# Patient Record
Sex: Female | Born: 1935
Health system: Southern US, Community
[De-identification: ages and names within clinical notes are randomized; demographics above are authoritative.]

## PROBLEM LIST (undated history)

## (undated) DIAGNOSIS — E785 Hyperlipidemia, unspecified: Secondary | ICD-10-CM

## (undated) DIAGNOSIS — H409 Unspecified glaucoma: Secondary | ICD-10-CM

## (undated) DIAGNOSIS — R51 Headache: Secondary | ICD-10-CM

## (undated) DIAGNOSIS — H548 Legal blindness, as defined in USA: Secondary | ICD-10-CM

## (undated) DIAGNOSIS — I10 Essential (primary) hypertension: Secondary | ICD-10-CM

## (undated) DIAGNOSIS — I609 Nontraumatic subarachnoid hemorrhage, unspecified: Secondary | ICD-10-CM

## (undated) DIAGNOSIS — R519 Headache, unspecified: Secondary | ICD-10-CM

## (undated) HISTORY — DX: Headache, unspecified: R51.9

## (undated) HISTORY — DX: Essential (primary) hypertension: I10

## (undated) HISTORY — PX: CORNEAL TRANSPLANT: SHX108

## (undated) HISTORY — DX: Hyperlipidemia, unspecified: E78.5

## (undated) HISTORY — DX: Nontraumatic subarachnoid hemorrhage, unspecified: I60.9

## (undated) HISTORY — DX: Headache: R51

## (undated) HISTORY — DX: Unspecified glaucoma: H40.9

---

## 1973-04-12 HISTORY — PX: ABDOMINAL HYSTERECTOMY: SHX81

## 2014-04-17 DIAGNOSIS — I10 Essential (primary) hypertension: Secondary | ICD-10-CM | POA: Diagnosis not present

## 2014-04-17 DIAGNOSIS — R109 Unspecified abdominal pain: Secondary | ICD-10-CM | POA: Diagnosis not present

## 2014-04-17 DIAGNOSIS — R0600 Dyspnea, unspecified: Secondary | ICD-10-CM | POA: Diagnosis not present

## 2014-04-17 DIAGNOSIS — K219 Gastro-esophageal reflux disease without esophagitis: Secondary | ICD-10-CM | POA: Diagnosis not present

## 2014-04-18 DIAGNOSIS — R079 Chest pain, unspecified: Secondary | ICD-10-CM | POA: Diagnosis not present

## 2014-04-18 DIAGNOSIS — I10 Essential (primary) hypertension: Secondary | ICD-10-CM | POA: Diagnosis not present

## 2014-04-18 DIAGNOSIS — E782 Mixed hyperlipidemia: Secondary | ICD-10-CM | POA: Diagnosis not present

## 2014-04-23 DIAGNOSIS — I10 Essential (primary) hypertension: Secondary | ICD-10-CM | POA: Diagnosis not present

## 2014-04-23 DIAGNOSIS — R109 Unspecified abdominal pain: Secondary | ICD-10-CM | POA: Diagnosis not present

## 2014-04-23 DIAGNOSIS — K219 Gastro-esophageal reflux disease without esophagitis: Secondary | ICD-10-CM | POA: Diagnosis not present

## 2014-04-26 DIAGNOSIS — H402233 Chronic angle-closure glaucoma, bilateral, severe stage: Secondary | ICD-10-CM | POA: Diagnosis not present

## 2014-04-30 DIAGNOSIS — R109 Unspecified abdominal pain: Secondary | ICD-10-CM | POA: Diagnosis not present

## 2014-04-30 DIAGNOSIS — R079 Chest pain, unspecified: Secondary | ICD-10-CM | POA: Diagnosis not present

## 2014-05-09 DIAGNOSIS — J069 Acute upper respiratory infection, unspecified: Secondary | ICD-10-CM | POA: Diagnosis not present

## 2014-05-09 DIAGNOSIS — I10 Essential (primary) hypertension: Secondary | ICD-10-CM | POA: Diagnosis not present

## 2014-05-09 DIAGNOSIS — R0600 Dyspnea, unspecified: Secondary | ICD-10-CM | POA: Diagnosis not present

## 2014-05-09 DIAGNOSIS — K219 Gastro-esophageal reflux disease without esophagitis: Secondary | ICD-10-CM | POA: Diagnosis not present

## 2014-05-09 DIAGNOSIS — M5030 Other cervical disc degeneration, unspecified cervical region: Secondary | ICD-10-CM | POA: Diagnosis not present

## 2014-05-20 DIAGNOSIS — R110 Nausea: Secondary | ICD-10-CM | POA: Diagnosis not present

## 2014-05-20 DIAGNOSIS — R072 Precordial pain: Secondary | ICD-10-CM | POA: Diagnosis not present

## 2014-05-20 DIAGNOSIS — M79602 Pain in left arm: Secondary | ICD-10-CM | POA: Diagnosis not present

## 2014-05-20 DIAGNOSIS — M542 Cervicalgia: Secondary | ICD-10-CM | POA: Diagnosis not present

## 2014-06-06 DIAGNOSIS — M47812 Spondylosis without myelopathy or radiculopathy, cervical region: Secondary | ICD-10-CM | POA: Diagnosis not present

## 2014-06-06 DIAGNOSIS — G894 Chronic pain syndrome: Secondary | ICD-10-CM | POA: Diagnosis not present

## 2014-06-06 DIAGNOSIS — M47892 Other spondylosis, cervical region: Secondary | ICD-10-CM | POA: Diagnosis not present

## 2014-06-06 DIAGNOSIS — Z87891 Personal history of nicotine dependence: Secondary | ICD-10-CM | POA: Diagnosis not present

## 2014-06-21 DIAGNOSIS — M5412 Radiculopathy, cervical region: Secondary | ICD-10-CM | POA: Diagnosis not present

## 2014-06-21 DIAGNOSIS — M47812 Spondylosis without myelopathy or radiculopathy, cervical region: Secondary | ICD-10-CM | POA: Diagnosis not present

## 2014-06-21 DIAGNOSIS — M4802 Spinal stenosis, cervical region: Secondary | ICD-10-CM | POA: Diagnosis not present

## 2014-06-21 DIAGNOSIS — M791 Myalgia: Secondary | ICD-10-CM | POA: Diagnosis not present

## 2014-06-21 DIAGNOSIS — G894 Chronic pain syndrome: Secondary | ICD-10-CM | POA: Diagnosis not present

## 2014-06-21 DIAGNOSIS — M4800 Spinal stenosis, site unspecified: Secondary | ICD-10-CM | POA: Diagnosis not present

## 2014-07-08 DIAGNOSIS — H402230 Chronic angle-closure glaucoma, bilateral, stage unspecified: Secondary | ICD-10-CM | POA: Diagnosis not present

## 2014-07-08 DIAGNOSIS — Z961 Presence of intraocular lens: Secondary | ICD-10-CM | POA: Diagnosis not present

## 2014-07-08 DIAGNOSIS — H18233 Secondary corneal edema, bilateral: Secondary | ICD-10-CM | POA: Diagnosis not present

## 2014-07-08 DIAGNOSIS — Z947 Corneal transplant status: Secondary | ICD-10-CM | POA: Diagnosis not present

## 2014-07-08 DIAGNOSIS — H02403 Unspecified ptosis of bilateral eyelids: Secondary | ICD-10-CM | POA: Diagnosis not present

## 2014-07-08 DIAGNOSIS — H35352 Cystoid macular degeneration, left eye: Secondary | ICD-10-CM | POA: Diagnosis not present

## 2014-07-08 DIAGNOSIS — T85398D Other mechanical complication of other ocular prosthetic devices, implants and grafts, subsequent encounter: Secondary | ICD-10-CM | POA: Diagnosis not present

## 2014-07-09 DIAGNOSIS — I10 Essential (primary) hypertension: Secondary | ICD-10-CM | POA: Diagnosis not present

## 2014-07-09 DIAGNOSIS — K219 Gastro-esophageal reflux disease without esophagitis: Secondary | ICD-10-CM | POA: Diagnosis not present

## 2014-07-09 DIAGNOSIS — E785 Hyperlipidemia, unspecified: Secondary | ICD-10-CM | POA: Diagnosis not present

## 2014-07-09 DIAGNOSIS — M47812 Spondylosis without myelopathy or radiculopathy, cervical region: Secondary | ICD-10-CM | POA: Diagnosis not present

## 2014-07-17 DIAGNOSIS — H35352 Cystoid macular degeneration, left eye: Secondary | ICD-10-CM | POA: Diagnosis not present

## 2014-07-18 DIAGNOSIS — E785 Hyperlipidemia, unspecified: Secondary | ICD-10-CM | POA: Diagnosis not present

## 2014-07-18 DIAGNOSIS — M47812 Spondylosis without myelopathy or radiculopathy, cervical region: Secondary | ICD-10-CM | POA: Diagnosis not present

## 2014-07-25 DIAGNOSIS — M47812 Spondylosis without myelopathy or radiculopathy, cervical region: Secondary | ICD-10-CM | POA: Diagnosis not present

## 2014-07-26 DIAGNOSIS — H4011X3 Primary open-angle glaucoma, severe stage: Secondary | ICD-10-CM | POA: Diagnosis not present

## 2014-08-07 DIAGNOSIS — R42 Dizziness and giddiness: Secondary | ICD-10-CM | POA: Diagnosis not present

## 2014-08-07 DIAGNOSIS — G319 Degenerative disease of nervous system, unspecified: Secondary | ICD-10-CM | POA: Diagnosis not present

## 2014-08-07 DIAGNOSIS — H538 Other visual disturbances: Secondary | ICD-10-CM | POA: Diagnosis not present

## 2014-08-07 DIAGNOSIS — Z87891 Personal history of nicotine dependence: Secondary | ICD-10-CM | POA: Diagnosis not present

## 2014-08-07 DIAGNOSIS — R51 Headache: Secondary | ICD-10-CM | POA: Diagnosis not present

## 2014-08-07 DIAGNOSIS — I10 Essential (primary) hypertension: Secondary | ICD-10-CM | POA: Diagnosis not present

## 2014-08-08 DIAGNOSIS — R51 Headache: Secondary | ICD-10-CM | POA: Diagnosis not present

## 2014-08-08 DIAGNOSIS — M5030 Other cervical disc degeneration, unspecified cervical region: Secondary | ICD-10-CM | POA: Diagnosis not present

## 2014-08-19 DIAGNOSIS — M791 Myalgia: Secondary | ICD-10-CM | POA: Diagnosis not present

## 2014-08-19 DIAGNOSIS — M4802 Spinal stenosis, cervical region: Secondary | ICD-10-CM | POA: Diagnosis not present

## 2014-08-19 DIAGNOSIS — G894 Chronic pain syndrome: Secondary | ICD-10-CM | POA: Diagnosis not present

## 2014-08-19 DIAGNOSIS — M5412 Radiculopathy, cervical region: Secondary | ICD-10-CM | POA: Diagnosis not present

## 2014-08-30 DIAGNOSIS — H402233 Chronic angle-closure glaucoma, bilateral, severe stage: Secondary | ICD-10-CM | POA: Diagnosis not present

## 2014-09-02 DIAGNOSIS — M4802 Spinal stenosis, cervical region: Secondary | ICD-10-CM | POA: Diagnosis not present

## 2014-09-02 DIAGNOSIS — G894 Chronic pain syndrome: Secondary | ICD-10-CM | POA: Diagnosis not present

## 2014-09-02 DIAGNOSIS — M47812 Spondylosis without myelopathy or radiculopathy, cervical region: Secondary | ICD-10-CM | POA: Diagnosis not present

## 2014-09-02 DIAGNOSIS — M5412 Radiculopathy, cervical region: Secondary | ICD-10-CM | POA: Diagnosis not present

## 2014-09-17 DIAGNOSIS — Z6827 Body mass index (BMI) 27.0-27.9, adult: Secondary | ICD-10-CM | POA: Diagnosis not present

## 2014-09-17 DIAGNOSIS — Z87891 Personal history of nicotine dependence: Secondary | ICD-10-CM | POA: Diagnosis not present

## 2014-09-17 DIAGNOSIS — M5412 Radiculopathy, cervical region: Secondary | ICD-10-CM | POA: Diagnosis not present

## 2014-09-17 DIAGNOSIS — I10 Essential (primary) hypertension: Secondary | ICD-10-CM | POA: Diagnosis not present

## 2014-10-10 DIAGNOSIS — R51 Headache: Secondary | ICD-10-CM | POA: Diagnosis not present

## 2014-10-10 DIAGNOSIS — M4802 Spinal stenosis, cervical region: Secondary | ICD-10-CM | POA: Diagnosis not present

## 2014-10-10 DIAGNOSIS — K219 Gastro-esophageal reflux disease without esophagitis: Secondary | ICD-10-CM | POA: Diagnosis not present

## 2014-10-10 DIAGNOSIS — M4800 Spinal stenosis, site unspecified: Secondary | ICD-10-CM | POA: Diagnosis not present

## 2014-10-10 DIAGNOSIS — I10 Essential (primary) hypertension: Secondary | ICD-10-CM | POA: Diagnosis not present

## 2014-10-10 DIAGNOSIS — M5030 Other cervical disc degeneration, unspecified cervical region: Secondary | ICD-10-CM | POA: Diagnosis not present

## 2014-10-10 DIAGNOSIS — E785 Hyperlipidemia, unspecified: Secondary | ICD-10-CM | POA: Diagnosis not present

## 2014-11-07 DIAGNOSIS — M5412 Radiculopathy, cervical region: Secondary | ICD-10-CM | POA: Diagnosis not present

## 2014-11-07 DIAGNOSIS — M47812 Spondylosis without myelopathy or radiculopathy, cervical region: Secondary | ICD-10-CM | POA: Diagnosis not present

## 2014-11-07 DIAGNOSIS — I10 Essential (primary) hypertension: Secondary | ICD-10-CM | POA: Diagnosis not present

## 2014-11-07 DIAGNOSIS — Z87891 Personal history of nicotine dependence: Secondary | ICD-10-CM | POA: Diagnosis not present

## 2014-11-07 DIAGNOSIS — G894 Chronic pain syndrome: Secondary | ICD-10-CM | POA: Diagnosis not present

## 2014-11-07 DIAGNOSIS — M791 Myalgia: Secondary | ICD-10-CM | POA: Diagnosis not present

## 2014-11-07 DIAGNOSIS — M4802 Spinal stenosis, cervical region: Secondary | ICD-10-CM | POA: Diagnosis not present

## 2014-11-12 DIAGNOSIS — R9431 Abnormal electrocardiogram [ECG] [EKG]: Secondary | ICD-10-CM | POA: Diagnosis not present

## 2014-11-12 DIAGNOSIS — R079 Chest pain, unspecified: Secondary | ICD-10-CM | POA: Diagnosis not present

## 2014-11-12 DIAGNOSIS — I10 Essential (primary) hypertension: Secondary | ICD-10-CM | POA: Diagnosis not present

## 2014-11-12 DIAGNOSIS — E782 Mixed hyperlipidemia: Secondary | ICD-10-CM | POA: Diagnosis not present

## 2014-11-18 DIAGNOSIS — K115 Sialolithiasis: Secondary | ICD-10-CM | POA: Diagnosis not present

## 2014-11-18 DIAGNOSIS — K1120 Sialoadenitis, unspecified: Secondary | ICD-10-CM | POA: Diagnosis not present

## 2014-11-20 DIAGNOSIS — H4011X3 Primary open-angle glaucoma, severe stage: Secondary | ICD-10-CM | POA: Diagnosis not present

## 2014-12-13 DIAGNOSIS — H18232 Secondary corneal edema, left eye: Secondary | ICD-10-CM | POA: Diagnosis not present

## 2014-12-13 DIAGNOSIS — Z947 Corneal transplant status: Secondary | ICD-10-CM | POA: Diagnosis not present

## 2014-12-13 DIAGNOSIS — H1789 Other corneal scars and opacities: Secondary | ICD-10-CM | POA: Diagnosis not present

## 2015-01-03 DIAGNOSIS — H18232 Secondary corneal edema, left eye: Secondary | ICD-10-CM | POA: Diagnosis not present

## 2015-01-03 DIAGNOSIS — Z947 Corneal transplant status: Secondary | ICD-10-CM | POA: Diagnosis not present

## 2015-01-03 DIAGNOSIS — H3531 Nonexudative age-related macular degeneration: Secondary | ICD-10-CM | POA: Diagnosis not present

## 2015-01-03 DIAGNOSIS — H402232 Chronic angle-closure glaucoma, bilateral, moderate stage: Secondary | ICD-10-CM | POA: Diagnosis not present

## 2015-01-10 DIAGNOSIS — Z23 Encounter for immunization: Secondary | ICD-10-CM | POA: Diagnosis not present

## 2015-01-10 DIAGNOSIS — J111 Influenza due to unidentified influenza virus with other respiratory manifestations: Secondary | ICD-10-CM | POA: Diagnosis not present

## 2015-01-10 DIAGNOSIS — M4802 Spinal stenosis, cervical region: Secondary | ICD-10-CM | POA: Diagnosis not present

## 2015-01-10 DIAGNOSIS — R5383 Other fatigue: Secondary | ICD-10-CM | POA: Diagnosis not present

## 2015-01-10 DIAGNOSIS — R609 Edema, unspecified: Secondary | ICD-10-CM | POA: Diagnosis not present

## 2015-01-10 DIAGNOSIS — R51 Headache: Secondary | ICD-10-CM | POA: Diagnosis not present

## 2015-01-10 DIAGNOSIS — G894 Chronic pain syndrome: Secondary | ICD-10-CM | POA: Diagnosis not present

## 2015-01-10 DIAGNOSIS — R0600 Dyspnea, unspecified: Secondary | ICD-10-CM | POA: Diagnosis not present

## 2015-01-15 DIAGNOSIS — H35352 Cystoid macular degeneration, left eye: Secondary | ICD-10-CM | POA: Diagnosis not present

## 2015-01-24 DIAGNOSIS — H18232 Secondary corneal edema, left eye: Secondary | ICD-10-CM | POA: Diagnosis not present

## 2015-01-24 DIAGNOSIS — Z947 Corneal transplant status: Secondary | ICD-10-CM | POA: Diagnosis not present

## 2015-02-13 DIAGNOSIS — H1820 Unspecified corneal edema: Secondary | ICD-10-CM | POA: Diagnosis not present

## 2015-02-13 DIAGNOSIS — H18811 Anesthesia and hypoesthesia of cornea, right eye: Secondary | ICD-10-CM | POA: Diagnosis not present

## 2015-02-13 DIAGNOSIS — H21512 Anterior synechiae (iris), left eye: Secondary | ICD-10-CM | POA: Diagnosis not present

## 2015-02-13 DIAGNOSIS — H18232 Secondary corneal edema, left eye: Secondary | ICD-10-CM | POA: Diagnosis not present

## 2015-02-24 DIAGNOSIS — H401133 Primary open-angle glaucoma, bilateral, severe stage: Secondary | ICD-10-CM | POA: Diagnosis not present

## 2015-03-17 DIAGNOSIS — M2578 Osteophyte, vertebrae: Secondary | ICD-10-CM | POA: Diagnosis not present

## 2015-03-17 DIAGNOSIS — M479 Spondylosis, unspecified: Secondary | ICD-10-CM | POA: Diagnosis not present

## 2015-03-17 DIAGNOSIS — M47814 Spondylosis without myelopathy or radiculopathy, thoracic region: Secondary | ICD-10-CM | POA: Diagnosis not present

## 2015-03-17 DIAGNOSIS — R0789 Other chest pain: Secondary | ICD-10-CM | POA: Diagnosis not present

## 2015-04-14 DIAGNOSIS — I4891 Unspecified atrial fibrillation: Secondary | ICD-10-CM | POA: Diagnosis not present

## 2015-04-14 DIAGNOSIS — E785 Hyperlipidemia, unspecified: Secondary | ICD-10-CM | POA: Diagnosis not present

## 2015-04-16 DIAGNOSIS — E785 Hyperlipidemia, unspecified: Secondary | ICD-10-CM | POA: Diagnosis not present

## 2015-04-16 DIAGNOSIS — I480 Paroxysmal atrial fibrillation: Secondary | ICD-10-CM | POA: Diagnosis not present

## 2015-04-16 DIAGNOSIS — I4891 Unspecified atrial fibrillation: Secondary | ICD-10-CM | POA: Diagnosis not present

## 2015-04-17 DIAGNOSIS — I4891 Unspecified atrial fibrillation: Secondary | ICD-10-CM | POA: Diagnosis not present

## 2015-04-17 DIAGNOSIS — E785 Hyperlipidemia, unspecified: Secondary | ICD-10-CM | POA: Diagnosis not present

## 2015-04-17 DIAGNOSIS — I480 Paroxysmal atrial fibrillation: Secondary | ICD-10-CM | POA: Diagnosis not present

## 2015-04-25 DIAGNOSIS — H401133 Primary open-angle glaucoma, bilateral, severe stage: Secondary | ICD-10-CM | POA: Diagnosis not present

## 2015-04-30 DIAGNOSIS — R109 Unspecified abdominal pain: Secondary | ICD-10-CM | POA: Diagnosis not present

## 2015-04-30 DIAGNOSIS — R079 Chest pain, unspecified: Secondary | ICD-10-CM | POA: Diagnosis not present

## 2015-04-30 DIAGNOSIS — E785 Hyperlipidemia, unspecified: Secondary | ICD-10-CM | POA: Diagnosis not present

## 2015-04-30 DIAGNOSIS — Z743 Need for continuous supervision: Secondary | ICD-10-CM | POA: Diagnosis not present

## 2015-04-30 DIAGNOSIS — D3502 Benign neoplasm of left adrenal gland: Secondary | ICD-10-CM | POA: Diagnosis not present

## 2015-04-30 DIAGNOSIS — R0989 Other specified symptoms and signs involving the circulatory and respiratory systems: Secondary | ICD-10-CM | POA: Diagnosis not present

## 2015-04-30 DIAGNOSIS — R1084 Generalized abdominal pain: Secondary | ICD-10-CM | POA: Diagnosis not present

## 2015-04-30 DIAGNOSIS — R531 Weakness: Secondary | ICD-10-CM | POA: Diagnosis not present

## 2015-04-30 DIAGNOSIS — I10 Essential (primary) hypertension: Secondary | ICD-10-CM | POA: Diagnosis not present

## 2015-04-30 DIAGNOSIS — K219 Gastro-esophageal reflux disease without esophagitis: Secondary | ICD-10-CM | POA: Diagnosis not present

## 2015-04-30 DIAGNOSIS — H409 Unspecified glaucoma: Secondary | ICD-10-CM | POA: Diagnosis not present

## 2015-05-01 DIAGNOSIS — I10 Essential (primary) hypertension: Secondary | ICD-10-CM | POA: Diagnosis not present

## 2015-05-01 DIAGNOSIS — R0989 Other specified symptoms and signs involving the circulatory and respiratory systems: Secondary | ICD-10-CM | POA: Diagnosis not present

## 2015-05-01 DIAGNOSIS — R109 Unspecified abdominal pain: Secondary | ICD-10-CM | POA: Diagnosis not present

## 2015-05-01 DIAGNOSIS — R531 Weakness: Secondary | ICD-10-CM | POA: Diagnosis not present

## 2015-05-04 DIAGNOSIS — M6281 Muscle weakness (generalized): Secondary | ICD-10-CM | POA: Diagnosis not present

## 2015-05-04 DIAGNOSIS — I10 Essential (primary) hypertension: Secondary | ICD-10-CM | POA: Diagnosis not present

## 2015-05-05 DIAGNOSIS — R0789 Other chest pain: Secondary | ICD-10-CM | POA: Diagnosis not present

## 2015-05-06 DIAGNOSIS — M47812 Spondylosis without myelopathy or radiculopathy, cervical region: Secondary | ICD-10-CM | POA: Diagnosis not present

## 2015-05-06 DIAGNOSIS — R51 Headache: Secondary | ICD-10-CM | POA: Diagnosis not present

## 2015-05-06 DIAGNOSIS — K219 Gastro-esophageal reflux disease without esophagitis: Secondary | ICD-10-CM | POA: Diagnosis not present

## 2015-05-06 DIAGNOSIS — G894 Chronic pain syndrome: Secondary | ICD-10-CM | POA: Diagnosis not present

## 2015-05-06 DIAGNOSIS — M4802 Spinal stenosis, cervical region: Secondary | ICD-10-CM | POA: Diagnosis not present

## 2015-05-06 DIAGNOSIS — I10 Essential (primary) hypertension: Secondary | ICD-10-CM | POA: Diagnosis not present

## 2015-05-07 DIAGNOSIS — R928 Other abnormal and inconclusive findings on diagnostic imaging of breast: Secondary | ICD-10-CM | POA: Diagnosis not present

## 2015-05-07 DIAGNOSIS — Z1231 Encounter for screening mammogram for malignant neoplasm of breast: Secondary | ICD-10-CM | POA: Diagnosis not present

## 2015-05-07 LAB — HM MAMMOGRAPHY

## 2015-05-08 DIAGNOSIS — M6281 Muscle weakness (generalized): Secondary | ICD-10-CM | POA: Diagnosis not present

## 2015-05-08 DIAGNOSIS — I10 Essential (primary) hypertension: Secondary | ICD-10-CM | POA: Diagnosis not present

## 2015-05-12 DIAGNOSIS — R51 Headache: Secondary | ICD-10-CM | POA: Diagnosis not present

## 2015-05-14 DIAGNOSIS — I10 Essential (primary) hypertension: Secondary | ICD-10-CM | POA: Diagnosis not present

## 2015-05-14 DIAGNOSIS — M6281 Muscle weakness (generalized): Secondary | ICD-10-CM | POA: Diagnosis not present

## 2015-05-16 DIAGNOSIS — R51 Headache: Secondary | ICD-10-CM | POA: Diagnosis not present

## 2015-05-16 DIAGNOSIS — G894 Chronic pain syndrome: Secondary | ICD-10-CM | POA: Diagnosis not present

## 2015-05-16 DIAGNOSIS — M5030 Other cervical disc degeneration, unspecified cervical region: Secondary | ICD-10-CM | POA: Diagnosis not present

## 2015-05-16 DIAGNOSIS — K219 Gastro-esophageal reflux disease without esophagitis: Secondary | ICD-10-CM | POA: Diagnosis not present

## 2015-05-16 DIAGNOSIS — I10 Essential (primary) hypertension: Secondary | ICD-10-CM | POA: Diagnosis not present

## 2015-05-16 DIAGNOSIS — R109 Unspecified abdominal pain: Secondary | ICD-10-CM | POA: Diagnosis not present

## 2015-05-21 DIAGNOSIS — I10 Essential (primary) hypertension: Secondary | ICD-10-CM | POA: Diagnosis not present

## 2015-05-21 DIAGNOSIS — M6281 Muscle weakness (generalized): Secondary | ICD-10-CM | POA: Diagnosis not present

## 2015-05-28 DIAGNOSIS — I10 Essential (primary) hypertension: Secondary | ICD-10-CM | POA: Diagnosis not present

## 2015-05-28 DIAGNOSIS — M6281 Muscle weakness (generalized): Secondary | ICD-10-CM | POA: Diagnosis not present

## 2015-06-02 DIAGNOSIS — Z947 Corneal transplant status: Secondary | ICD-10-CM | POA: Diagnosis not present

## 2015-06-04 DIAGNOSIS — M6281 Muscle weakness (generalized): Secondary | ICD-10-CM | POA: Diagnosis not present

## 2015-06-04 DIAGNOSIS — I10 Essential (primary) hypertension: Secondary | ICD-10-CM | POA: Diagnosis not present

## 2015-06-05 DIAGNOSIS — R079 Chest pain, unspecified: Secondary | ICD-10-CM | POA: Diagnosis not present

## 2015-06-05 DIAGNOSIS — Z01810 Encounter for preprocedural cardiovascular examination: Secondary | ICD-10-CM | POA: Diagnosis not present

## 2015-06-05 DIAGNOSIS — I10 Essential (primary) hypertension: Secondary | ICD-10-CM | POA: Diagnosis not present

## 2015-06-09 DIAGNOSIS — M6281 Muscle weakness (generalized): Secondary | ICD-10-CM | POA: Diagnosis not present

## 2015-06-09 DIAGNOSIS — I10 Essential (primary) hypertension: Secondary | ICD-10-CM | POA: Diagnosis not present

## 2015-06-27 DIAGNOSIS — Z947 Corneal transplant status: Secondary | ICD-10-CM | POA: Diagnosis not present

## 2015-06-27 DIAGNOSIS — H402232 Chronic angle-closure glaucoma, bilateral, moderate stage: Secondary | ICD-10-CM | POA: Diagnosis not present

## 2015-07-10 DIAGNOSIS — K219 Gastro-esophageal reflux disease without esophagitis: Secondary | ICD-10-CM | POA: Diagnosis not present

## 2015-07-10 DIAGNOSIS — G894 Chronic pain syndrome: Secondary | ICD-10-CM | POA: Diagnosis not present

## 2015-07-10 DIAGNOSIS — M47812 Spondylosis without myelopathy or radiculopathy, cervical region: Secondary | ICD-10-CM | POA: Diagnosis not present

## 2015-07-10 DIAGNOSIS — E785 Hyperlipidemia, unspecified: Secondary | ICD-10-CM | POA: Diagnosis not present

## 2015-07-10 DIAGNOSIS — Z947 Corneal transplant status: Secondary | ICD-10-CM | POA: Diagnosis not present

## 2015-07-10 DIAGNOSIS — M4800 Spinal stenosis, site unspecified: Secondary | ICD-10-CM | POA: Diagnosis not present

## 2015-07-10 DIAGNOSIS — D352 Benign neoplasm of pituitary gland: Secondary | ICD-10-CM | POA: Diagnosis not present

## 2015-07-10 DIAGNOSIS — I10 Essential (primary) hypertension: Secondary | ICD-10-CM | POA: Diagnosis not present

## 2015-07-14 DIAGNOSIS — Z947 Corneal transplant status: Secondary | ICD-10-CM | POA: Diagnosis not present

## 2015-07-14 DIAGNOSIS — H402232 Chronic angle-closure glaucoma, bilateral, moderate stage: Secondary | ICD-10-CM | POA: Diagnosis not present

## 2015-07-16 DIAGNOSIS — H35352 Cystoid macular degeneration, left eye: Secondary | ICD-10-CM | POA: Diagnosis not present

## 2015-07-23 DIAGNOSIS — H401133 Primary open-angle glaucoma, bilateral, severe stage: Secondary | ICD-10-CM | POA: Diagnosis not present

## 2015-08-25 DIAGNOSIS — H402234 Chronic angle-closure glaucoma, bilateral, indeterminate stage: Secondary | ICD-10-CM | POA: Diagnosis not present

## 2015-08-25 DIAGNOSIS — H353120 Nonexudative age-related macular degeneration, left eye, stage unspecified: Secondary | ICD-10-CM | POA: Diagnosis not present

## 2015-08-25 DIAGNOSIS — Z9842 Cataract extraction status, left eye: Secondary | ICD-10-CM | POA: Diagnosis not present

## 2015-08-25 DIAGNOSIS — Z79899 Other long term (current) drug therapy: Secondary | ICD-10-CM | POA: Diagnosis not present

## 2015-08-25 DIAGNOSIS — H26493 Other secondary cataract, bilateral: Secondary | ICD-10-CM | POA: Diagnosis not present

## 2015-08-25 DIAGNOSIS — Z9841 Cataract extraction status, right eye: Secondary | ICD-10-CM | POA: Diagnosis not present

## 2015-08-25 DIAGNOSIS — H353110 Nonexudative age-related macular degeneration, right eye, stage unspecified: Secondary | ICD-10-CM | POA: Diagnosis not present

## 2015-08-25 DIAGNOSIS — Z87891 Personal history of nicotine dependence: Secondary | ICD-10-CM | POA: Diagnosis not present

## 2015-08-25 DIAGNOSIS — Z961 Presence of intraocular lens: Secondary | ICD-10-CM | POA: Diagnosis not present

## 2015-08-25 DIAGNOSIS — H02831 Dermatochalasis of right upper eyelid: Secondary | ICD-10-CM | POA: Diagnosis not present

## 2015-08-25 DIAGNOSIS — I10 Essential (primary) hypertension: Secondary | ICD-10-CM | POA: Diagnosis not present

## 2015-08-25 DIAGNOSIS — H47293 Other optic atrophy, bilateral: Secondary | ICD-10-CM | POA: Diagnosis not present

## 2015-08-25 DIAGNOSIS — H02834 Dermatochalasis of left upper eyelid: Secondary | ICD-10-CM | POA: Diagnosis not present

## 2015-08-25 DIAGNOSIS — H17823 Peripheral opacity of cornea, bilateral: Secondary | ICD-10-CM | POA: Diagnosis not present

## 2015-08-25 DIAGNOSIS — Z947 Corneal transplant status: Secondary | ICD-10-CM | POA: Diagnosis not present

## 2015-09-01 DIAGNOSIS — K219 Gastro-esophageal reflux disease without esophagitis: Secondary | ICD-10-CM | POA: Diagnosis not present

## 2015-09-01 DIAGNOSIS — Z131 Encounter for screening for diabetes mellitus: Secondary | ICD-10-CM | POA: Diagnosis not present

## 2015-09-01 DIAGNOSIS — Z0000 Encounter for general adult medical examination without abnormal findings: Secondary | ICD-10-CM | POA: Diagnosis not present

## 2015-09-01 DIAGNOSIS — R55 Syncope and collapse: Secondary | ICD-10-CM | POA: Diagnosis not present

## 2015-09-01 DIAGNOSIS — Z136 Encounter for screening for cardiovascular disorders: Secondary | ICD-10-CM | POA: Diagnosis not present

## 2015-09-01 DIAGNOSIS — G629 Polyneuropathy, unspecified: Secondary | ICD-10-CM | POA: Diagnosis not present

## 2015-09-01 DIAGNOSIS — Z1383 Encounter for screening for respiratory disorder NEC: Secondary | ICD-10-CM | POA: Diagnosis not present

## 2015-09-01 DIAGNOSIS — Z01118 Encounter for examination of ears and hearing with other abnormal findings: Secondary | ICD-10-CM | POA: Diagnosis not present

## 2015-09-01 DIAGNOSIS — I10 Essential (primary) hypertension: Secondary | ICD-10-CM | POA: Diagnosis not present

## 2015-09-09 DIAGNOSIS — I10 Essential (primary) hypertension: Secondary | ICD-10-CM | POA: Diagnosis not present

## 2015-09-09 DIAGNOSIS — R7303 Prediabetes: Secondary | ICD-10-CM | POA: Diagnosis not present

## 2015-09-09 DIAGNOSIS — K219 Gastro-esophageal reflux disease without esophagitis: Secondary | ICD-10-CM | POA: Diagnosis not present

## 2015-09-09 DIAGNOSIS — G629 Polyneuropathy, unspecified: Secondary | ICD-10-CM | POA: Diagnosis not present

## 2015-09-10 ENCOUNTER — Encounter: Payer: Self-pay | Admitting: Family

## 2015-09-10 ENCOUNTER — Ambulatory Visit (INDEPENDENT_AMBULATORY_CARE_PROVIDER_SITE_OTHER): Payer: Medicare Other | Admitting: Family

## 2015-09-10 VITALS — BP 112/59 | HR 67 | Temp 98.7°F | Resp 18 | Ht 65.0 in | Wt 148.0 lb

## 2015-09-10 DIAGNOSIS — Z111 Encounter for screening for respiratory tuberculosis: Secondary | ICD-10-CM

## 2015-09-10 DIAGNOSIS — H9190 Unspecified hearing loss, unspecified ear: Secondary | ICD-10-CM | POA: Diagnosis not present

## 2015-09-10 DIAGNOSIS — R0789 Other chest pain: Secondary | ICD-10-CM | POA: Insufficient documentation

## 2015-09-10 DIAGNOSIS — H409 Unspecified glaucoma: Secondary | ICD-10-CM

## 2015-09-10 DIAGNOSIS — E785 Hyperlipidemia, unspecified: Secondary | ICD-10-CM

## 2015-09-10 DIAGNOSIS — R079 Chest pain, unspecified: Secondary | ICD-10-CM | POA: Diagnosis not present

## 2015-09-10 DIAGNOSIS — I10 Essential (primary) hypertension: Secondary | ICD-10-CM

## 2015-09-10 NOTE — Patient Instructions (Addendum)
Please return on Friday for a nurse visit to have PPD read. Please follow up in 3 months for a medicare wellness visit.  You will be contacted about referral to the audiologist to evaluate your hearing. Please go to the ER if you develop recurrent/severe chest pain that does not resolve on its own.  We will work on getting your previous records.

## 2015-09-10 NOTE — Assessment & Plan Note (Signed)
BP is stable on current medications. Continue same. They decline blood work today, reporting that she "just had blood work." we will request this blood work.

## 2015-09-10 NOTE — Assessment & Plan Note (Signed)
Legally blind, followed by opthalmology at [facility].

## 2015-09-10 NOTE — Assessment & Plan Note (Signed)
Tolerating statin. Will request blood work to see most recent lipid panel.

## 2015-09-10 NOTE — Progress Notes (Signed)
Subjective:    Patient ID: Mary Trevino, female    DOB: 1926/08/03, 82 y.o.   MRN: [id number]  HPI  Mary Trevino is an 82 yr old female who presents today to establish care. She is brought here today by her daughter, with whom she lives.    Pmhx is significant for the following:  HTN-maintained on toprol xl.  BP Readings from Last 3 Encounters:  09/10/15 112/59   Hyperlipidemia- maintained on lipitor.   Glaucoma- on combigan, lotemax, lumigan eye drops- legally blind.   She reports some left sided chest pain, occurs daily.  Comes and goes.  Reports that this generally occurs at rest.  Reports that it resolves on its own. Denies associated SOB.  Denies current chest pain. Denies chest wall tenderness.     Review of Systems  Constitutional: Negative for unexpected weight change.  HENT: Positive for hearing loss. Negative for rhinorrhea.   Eyes: Positive for visual disturbance.  Respiratory: Negative for cough.   Cardiovascular: Positive for chest pain.  Gastrointestinal: Negative for diarrhea and constipation.  Genitourinary: Negative for dysuria.       Chronic urinary frequency "especially at night."  Musculoskeletal:       Rare low back pain  Skin: Negative for rash.  Neurological:       Sometimes "pain on top of head"    Hematological: Negative for adenopathy.  Psychiatric/Behavioral:       Denies depression/anxiety     Past Medical History  Diagnosis Date  . Hypertension   . Hyperlipidemia   . Glaucoma      Social History   Social History  . Marital Status: Widowed    Spouse Name: N/A  . Number of Children: N/A  . Years of Education: N/A   Occupational History  . Not on file.   Social History Main Topics  . Smoking status: Former Smoker -- 10 years  . Smokeless tobacco: Not on file  . Alcohol Use: No  . Drug Use: No  . Sexual Activity: Not on file   Other Topics Concern  . Not on file   Social History Narrative  . No narrative on file     Past Surgical History  Procedure Laterality Date  . Abdominal hysterectomy  1975    History reviewed. No pertinent family history.  No Known Allergies  No current outpatient prescriptions on file prior to visit.   No current facility-administered medications on file prior to visit.    BP 112/59 mmHg  Pulse 67  Temp(Src) 98.7 F (37.1 C) (Oral)  Resp 18  Ht 5\' 5"  (1.651 m)  Wt 148 lb (67.132 kg)  BMI 24.63 kg/m2  SpO2 99%       Objective:   Physical Exam  Constitutional: She is oriented to person, place, and time. She appears well-developed and well-nourished.  HENT:  Head: Normocephalic and atraumatic.  Eyes: No scleral icterus.  Bilateral senile arcus  Neck: No thyromegaly present.  Cardiovascular: Normal rate, regular rhythm and normal heart sounds.   No murmur heard. Pulmonary/Chest: Effort normal and breath sounds normal. No respiratory distress. She has no wheezes.  Abdominal: Soft. She exhibits no distension. There is no tenderness. There is no rebound and no guarding.  Musculoskeletal: She exhibits no edema.  Lymphadenopathy:    She has no cervical adenopathy.  Neurological: She is alert and oriented to person, place, and time.  Skin: Skin is warm and dry. No rash noted. No erythema.  Psychiatric: She has a  normal mood and affect. Her behavior is normal. Judgment and thought content normal.          Assessment & Plan:  Hearing loss- refer to audiology.   PPD placed today for her paperwork.

## 2015-09-10 NOTE — Assessment & Plan Note (Signed)
EKG is performed and personally reviewed today. Note is made of LAFB. No EKG available for comparison.  Advised pt that if severe/worsening CP she should proceed to the ER.  Otherwise, will refer to cardiology for further evaluation.

## 2015-09-11 ENCOUNTER — Encounter: Payer: Self-pay | Admitting: Cardiology

## 2015-09-12 ENCOUNTER — Telehealth: Payer: Self-pay | Admitting: *Deleted

## 2015-09-12 LAB — TB SKIN TEST
Induration: 0 mm
TB Skin Test: NEGATIVE

## 2015-09-12 NOTE — Telephone Encounter (Signed)
Faxed records release to [facility] at [phone]. Awaiting records.

## 2015-09-16 ENCOUNTER — Ambulatory Visit (INDEPENDENT_AMBULATORY_CARE_PROVIDER_SITE_OTHER): Payer: Medicare Other | Admitting: Cardiology

## 2015-09-16 ENCOUNTER — Encounter: Payer: Self-pay | Admitting: Cardiology

## 2015-09-16 VITALS — BP 126/82 | HR 60 | Ht 64.0 in | Wt 147.1 lb

## 2015-09-16 DIAGNOSIS — R072 Precordial pain: Secondary | ICD-10-CM | POA: Diagnosis not present

## 2015-09-16 DIAGNOSIS — E785 Hyperlipidemia, unspecified: Secondary | ICD-10-CM | POA: Diagnosis not present

## 2015-09-16 DIAGNOSIS — R600 Localized edema: Secondary | ICD-10-CM | POA: Diagnosis not present

## 2015-09-16 DIAGNOSIS — R55 Syncope and collapse: Secondary | ICD-10-CM | POA: Diagnosis not present

## 2015-09-16 MED ORDER — METOPROLOL SUCCINATE ER 25 MG PO TB24: 25.0000 mg | ORAL_TABLET | Freq: Every day | ORAL | Status: DC

## 2015-09-16 MED ORDER — FUROSEMIDE 20 MG PO TABS: 20.0000 mg | ORAL_TABLET | Freq: Every day | ORAL | Status: DC | PRN

## 2015-09-16 NOTE — Progress Notes (Signed)
Cardiology Office Note    Date:  09/16/2015   ID:  Mary Trevino, DOB 03/21/1927, MRN [id number]  PCP:  Nance Pear., NP  Cardiologist:   Ena Dawley, MD  Referring provider: Nance Pear., NP   Chief complain: Chest pain, lower extremity edema.  History of Present Illness:  Mary Trevino is a 82 y.o. female who looks younger than stated age, the patient has no prior cardiac history and is being followed by her primary care physician for hypertension and hyperlipidemia. Approximately 2 weeks ago she experienced syncopal episode, she was eating breakfast all sudden she developed blurry vision, no palpitations or chest pain. Her syncopal episode was witnessed by her son-in-law called EMS. Upon arrival of EMS she was already awake. The patient states that in the last 2 weeks she has noticed retrosternal pressure-like pain that are not related to activity. There is no radiation of the pain and yesterday it lasted all day. There is no associated shortness of breath or dizziness. She has also noticed lower extremity edema more on right than left, that is on and off. She denies any orthopnea or paroxysmal nocturnal dyspnea. Her mother had myocardial infarction in her 56s and died of another one in her 32s.  Past Medical History  Diagnosis Date  . Hypertension   . Hyperlipidemia   . Glaucoma     Past Surgical History  Procedure Laterality Date  . Abdominal hysterectomy  1975  . Corneal transplant      2016 (left) 2014 (right)     Current Medications: Outpatient Prescriptions Prior to Visit  Medication Sig Dispense Refill  . atorvastatin (LIPITOR) 10 MG tablet Take 10 mg by mouth daily.    . bimatoprost (LUMIGAN) 0.01 % SOLN Place 1 drop into the right eye nightly    . brimonidine-timolol (COMBIGAN) 0.2-0.5 % ophthalmic solution Place 1 drop into both eyes twice a day    . dorzolamide (TRUSOPT) 2 % ophthalmic solution Place 1 drop into the right eye daily    .  erythromycin ophthalmic ointment Place into the left eye nightly    . gabapentin (NEURONTIN) 300 MG capsule Take 300 mg by mouth at bedtime.    [name] loteprednol (LOTEMAX) 0.5 % ophthalmic suspension Place 1 drop into the right eye daily    . prednisoLONE acetate (PRED FORTE) 1 % ophthalmic suspension Place 1 drop into the left eye twice a day    . metoprolol succinate (TOPROL-XL) 50 MG 24 hr tablet Take 50 mg by mouth daily.     No facility-administered medications prior to visit.     Allergies:   Review of patient's allergies indicates no known allergies.   Social History   Social History  . Marital Status: Widowed    Spouse Name: N/A  . Number of Children: N/A  . Years of Education: N/A   Social History Main Topics  . Smoking status: Former Smoker -- 10 years  . Smokeless tobacco: None  . Alcohol Use: No  . Drug Use: No  . Sexual Activity: Not Asked   Other Topics Concern  . 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     Family History:  The patient's family history includes Colon cancer in her daughter; Hypertension in her mother.  ROS:   Please see the history of present illness.    ROS All other systems reviewed and are negative.  PHYSICAL EXAM:   VS:  BP 126/82 mmHg  Pulse 60  Ht 5\' 4"  (1.626 m)  Wt 147 lb 1.9 oz (66.733 kg)  BMI 25.24 kg/m2   GEN: Well nourished, well developed, in no acute distress HEENT: normal Neck: no JVD, carotid bruits, or masses Cardiac: RRR; no murmurs, rubs, or gallops, mild non-pitting B/L LE edema  Respiratory:  clear to auscultation bilaterally, normal work of breathing GI: soft, nontender, nondistended, + BS MS: no deformity or atrophy Skin: warm and dry, no rash Neuro:  Alert and Oriented x 3, Strength and sensation are intact Psych:  euthymic mood, full affect  Wt Readings from Last 3 Encounters:  09/16/15 147 lb 1.9 oz (66.733 kg)  09/10/15 148 lb (67.132 kg)      Studies/Labs Reviewed:   EKG:  EKG is ordered today.  The ekg ordered today demonstrates Sinus rhythm, left axis deviation, inferior infarct age undetermined.  Recent Labs: No results found for requested labs within last 365 days.   Lipid Panel No results found for: CHOL, TRIG, HDL, CHOLHDL, VLDL, LDLCALC, LDLDIRECT    ASSESSMENT:    1. Precordial pain   2. Hyperlipidemia   3. Bilateral edema of lower extremity   4. Syncope, unspecified syncope type      PLAN:  In order of problems listed above:  1. Chest pain - somewhat atypical as it is not related to exertion, however her risk factors are her age, hypertension hyperlipidemia, we will schedule a Lexiscan nuclear stress test to evaluate for ischemia. 2. Bilateral lower extremity edema, - we will schedule an echocardiogram as she never had any to evaluate for systolic and diastolic function, we will prescribe Lasix 20 mg by mouth daily when necessary as she has borderline blood pressure and feels dizzy at times. 3. Syncope - appears like orthostatic hypotension, will decrease the dose of Toprol-XL from 50-25 mg daily.    Medication Adjustments/Labs and Tests Ordered: Current medicines are reviewed at length with the patient today.  Concerns regarding medicines are outlined above.  Medication changes, Labs and Tests ordered today are listed in the Patient Instructions below. Patient Instructions  Medication Instructions:   DECREASE YOUR TOPROL XL TO 25 MG ONCE DAILY  DR Santana Edell HAS PRESCRIBED YOU LASIX (FUROSEMIDE) 20 MG BY MOUTH DAILY, ONLY AS NEEDED FOR LOWER EXTREMITY EDEMA     Testing/Procedures:  Your physician has requested that you have an echocardiogram. Echocardiography is a painless test that uses sound waves to create images of your heart. It provides your doctor with  information about the size and shape of your heart and how well your heart's chambers and valves are working. This procedure takes approximately one hour. There are no restrictions for this procedure.    Your physician has requested that you have a lexiscan myoview. For further information please visit [email or web address]. Please follow instruction sheet, as given.    Follow-Up:  2 MONTHS WITH DR Meda Coffee      If you need a refill on your cardiac medications before your next appointment, please call your pharmacy.       Signed, Ena Dawley, MD  09/16/2015 9:47 AM    [address], [place], [place]  [address] Phone: [phone]; Fax: [phone]

## 2015-09-16 NOTE — Patient Instructions (Signed)
Medication Instructions:   DECREASE YOUR TOPROL XL TO 25 MG ONCE DAILY  DR NELSON HAS PRESCRIBED YOU LASIX (FUROSEMIDE) 20 MG BY MOUTH DAILY, ONLY AS NEEDED FOR LOWER EXTREMITY EDEMA     Testing/Procedures:  Your physician has requested that you have an echocardiogram. Echocardiography is a painless test that uses sound waves to create images of your heart. It provides your doctor with information about the size and shape of your heart and how well your heart's chambers and valves are working. This procedure takes approximately one hour. There are no restrictions for this procedure.    Your physician has requested that you have a lexiscan myoview. For further information please visit [email or web address]. Please follow instruction sheet, as given.    Follow-Up:  2 MONTHS WITH DR Meda Coffee      If you need a refill on your cardiac medications before your next appointment, please call your pharmacy.

## 2015-09-17 ENCOUNTER — Telehealth: Payer: Self-pay | Admitting: *Deleted

## 2015-09-17 NOTE — Telephone Encounter (Signed)
Received form from [employer] needing section 11 marked as domicillary (rest home). Form updated and faxed to [phone]. Copy sent for scanning.

## 2015-09-24 ENCOUNTER — Encounter: Payer: Self-pay | Admitting: Family Medicine

## 2015-09-24 ENCOUNTER — Ambulatory Visit (HOSPITAL_BASED_OUTPATIENT_CLINIC_OR_DEPARTMENT_OTHER)
Admission: RE | Admit: 2015-09-24 | Discharge: 2015-09-24 | Disposition: A | Discharge status: Home / Self Care | Payer: Medicare Other | Source: Ambulatory Visit | Attending: Family Medicine | Admitting: Family Medicine

## 2015-09-24 ENCOUNTER — Ambulatory Visit (INDEPENDENT_AMBULATORY_CARE_PROVIDER_SITE_OTHER): Payer: Medicare Other | Admitting: Family Medicine

## 2015-09-24 VITALS — BP 132/82 | HR 84 | Temp 100.8°F | Resp 20 | Wt 147.5 lb

## 2015-09-24 DIAGNOSIS — R062 Wheezing: Secondary | ICD-10-CM

## 2015-09-24 DIAGNOSIS — I517 Cardiomegaly: Secondary | ICD-10-CM | POA: Insufficient documentation

## 2015-09-24 DIAGNOSIS — J209 Acute bronchitis, unspecified: Secondary | ICD-10-CM | POA: Insufficient documentation

## 2015-09-24 DIAGNOSIS — R05 Cough: Secondary | ICD-10-CM | POA: Insufficient documentation

## 2015-09-24 DIAGNOSIS — J90 Pleural effusion, not elsewhere classified: Secondary | ICD-10-CM | POA: Diagnosis not present

## 2015-09-24 DIAGNOSIS — R059 Cough, unspecified: Secondary | ICD-10-CM | POA: Insufficient documentation

## 2015-09-24 MED ORDER — DOXYCYCLINE HYCLATE 100 MG PO TABS: 100.0000 mg | ORAL_TABLET | Freq: Two times a day (BID) | ORAL | Status: DC

## 2015-09-24 NOTE — Patient Instructions (Signed)

## 2015-09-24 NOTE — Progress Notes (Signed)
Patient ID: Mary Trevino, female   DOB: Oct 02, 1926, 82 y.o.   MRN: [id number]    Mary Trevino , 1926/07/21, 82 y.o., female MRN: [id number]  CC: URI symtpoms Subjective: Pt presents for an acute OV with complaints of URI symptoms of 3 days duration. Associated symptoms include fever, chills, decreased appetite, fatigue, cough, chest tightness/burning with cough. Patient has tried lozenges to help with cough. She is a former smoker.  Of note: she was seen at cardiology 09/16/2015 and started lasix for LE edema and decreased  Metoprolol dose, along with ordered cardiac studies.   No Known Allergies Social History  Substance Use Topics  . Smoking status: Former Smoker -- 10 years  . Smokeless tobacco: Not on file  . Alcohol Use: No   Past Medical History  Diagnosis Date  . Hypertension   . Hyperlipidemia   . Glaucoma    Past Surgical History  Procedure Laterality Date  . Abdominal hysterectomy  1975  . Corneal transplant      2016 (left) 2014 (right)    Family History  Problem Relation Age of Onset  . Hypertension Mother     died of cardiac arrest age 51  . Colon cancer Daughter     died age 85     Medication List       This list is accurate as of: 09/24/15  4:10 PM.  Always use your most recent med list.               atorvastatin 10 MG tablet  Commonly known as:  LIPITOR  Take 10 mg by mouth daily.     bimatoprost 0.01 % Soln  Commonly known as:  LUMIGAN  Place 1 drop into the right eye nightly     COMBIGAN 0.2-0.5 % ophthalmic solution  Generic drug:  brimonidine-timolol  Place 1 drop into both eyes twice a day     dorzolamide 2 % ophthalmic solution  Commonly known as:  TRUSOPT  Place 1 drop into the right eye daily     erythromycin ophthalmic ointment  Place into the left eye nightly     furosemide 20 MG tablet  Commonly known as:  LASIX  Take 1 tablet (20 mg total) by mouth daily as needed for edema.     gabapentin 300 MG capsule  Commonly known as:   NEURONTIN  Take 300 mg by mouth at bedtime.     loteprednol 0.5 % ophthalmic suspension  Commonly known as:  LOTEMAX     metoprolol succinate 25 MG 24 hr tablet  Commonly known as:  TOPROL XL  Take 1 tablet (25 mg total) by mouth daily.     prednisoLONE acetate 1 % ophthalmic suspension  Commonly known as:  PRED FORTE  Place 1 drop into the left eye twice a day         ROS: Negative, with the exception of above mentioned in HPI  Objective:  BP 132/82 mmHg  Pulse 84  Temp(Src) 100.8 F (38.2 C) (Oral)  Resp 20  Wt 127 lb 12.8 oz (57.97 kg)  SpO2 94% Body mass index is 21.93 kg/(m^2). Gen: febrile. Fatigue, lying on bed. Pleasant AAF.  HENT: AT. [place]. Bilateral TM visualized and normal in appearance. MMM, no oral lesions. Bilateral nares mild erythema. Throat without erythema or exudates. Cough and hoarseness present on exam. No TTP facial sinus.  Eyes:Pupils Equal Round Reactive to light, Extraocular movements intact,  Conjunctiva without redness, discharge or icterus. Neck/lymp/endocrine: Supple,left anterior cervical  Lymphadenopathy present  CV: RRR  Chest: mild wheeze RLL. No crakles or rhonchi. Diminished breath sounds bilateral. Normal resp. Effort.   Abd: Soft. NTND. BS present Neuro: Normal gait. PERLA. EOMi. Alert. Oriented x3  Assessment/Plan: [name] is a 82 y.o. female present for acute OV for  Cough/Wheezing/Acute bronchitis, unspecified organism - concern for pneumonia with cough, fever, fatigue and decrease O2 stats from normal baseline (99%--> 94%) - discussed emergent care needs if symptoms develop - rest, hydrate, OTC cough Supressant if desires - doxycycline (VIBRA-TABS) 100 MG tablet; Take 1 tablet (100 mg total) by mouth 2 (two) times daily.  Dispense: 20 tablet; Refill: 0 - DG Chest 2 View; Future - F/U 1 week, sooner if not improving or worsening.    > 25 minutes spent with patient, >50% of time spent face to face counseling patient and  coordinating care.  electronically signed by:  Howard Pouch, DO  [place]

## 2015-09-25 ENCOUNTER — Telehealth: Payer: Self-pay | Admitting: Family Medicine

## 2015-09-25 NOTE — Telephone Encounter (Signed)
Spoke with patient daughter reviewed results and detailed instructions. Patient daughter verbalized understanding.

## 2015-09-25 NOTE — Telephone Encounter (Signed)
Please call pt" - her CXR did show a mildly enlarged heart. There are no other images in the computer system to compare her prior heart size to current.  - She does not have pneumonia by the reading, but it may just be to early to show. Her presentation yesterday was consistent with pneumonia.   - I would encourage her to take her medications as prescribed and follow up either here or with her PCP on Monday to make certain she is improving. If she is worsening over the weekend or experiences shortness of breath, chest pain or increase LE edema she will need to be seen in the ED immediately.

## 2015-09-27 ENCOUNTER — Encounter (HOSPITAL_BASED_OUTPATIENT_CLINIC_OR_DEPARTMENT_OTHER): Payer: Self-pay | Admitting: Emergency Medicine

## 2015-09-27 ENCOUNTER — Emergency Department (HOSPITAL_BASED_OUTPATIENT_CLINIC_OR_DEPARTMENT_OTHER)
Admission: EM | Admit: 2015-09-27 | Discharge: 2015-09-27 | Disposition: A | Discharge status: Other Acute Inpatient Hospital | Payer: Medicare Other | Attending: Emergency Medicine | Admitting: Emergency Medicine

## 2015-09-27 DIAGNOSIS — Z87891 Personal history of nicotine dependence: Secondary | ICD-10-CM | POA: Diagnosis not present

## 2015-09-27 DIAGNOSIS — E785 Hyperlipidemia, unspecified: Secondary | ICD-10-CM | POA: Diagnosis not present

## 2015-09-27 DIAGNOSIS — Z79899 Other long term (current) drug therapy: Secondary | ICD-10-CM | POA: Insufficient documentation

## 2015-09-27 DIAGNOSIS — I10 Essential (primary) hypertension: Secondary | ICD-10-CM | POA: Insufficient documentation

## 2015-09-27 DIAGNOSIS — H538 Other visual disturbances: Secondary | ICD-10-CM

## 2015-09-27 MED ORDER — PROPARACAINE HCL 0.5 % OP SOLN
1.0000 [drp] | Freq: Once | OPHTHALMIC | Status: AC
  Administered 2015-09-27: 1 [drp] via OPHTHALMIC
  Filled 2015-09-27: qty 15

## 2015-09-27 NOTE — ED Notes (Signed)
Pt is transferred to ER @ [facility] (Dr. Kipp Laurence).  She is travelling by private vehicle.

## 2015-09-27 NOTE — ED Notes (Signed)
Pt reports sudden onset of blurred vision yesterday which lasted a few hours. Pt states she called her doctor who told her to put the drops she has for her corneal transplant in her eyes. Pt states she thinks the drops helped but the blurred vision returned this morning.

## 2015-09-27 NOTE — ED Provider Notes (Signed)
CSN: [id number]     Arrival date & time 09/27/15  1040 History   First MD Initiated Contact with Patient 09/27/15 1130     Chief Complaint  Patient presents with  . Blurred Vision     (Consider location/radiation/quality/duration/timing/severity/associated sxs/prior Treatment) HPI Mary Trevino is a 82 y.o. female with PMH significant for HTN, HLD, Glaucoma, b/l corneal transplants who presents with 1 day history of intermittent, episodic, lasting a couple of hours blurred vision in her left eye.  She had a left corneal transplant November 2016 and is seen by [facility].  She reports she had blurred vision yesterday that lasted a couple of hours.  She called the on call ophthalmologist who recommend urgent visit, but she refused.  She used her eye drops and ointment with relief.  Then this morning, she had an other episode of blurred vision around 10 AM, she is still experiencing blurred vision.  She had some eye pain yesterday, but denies any eye pain today.  Associated symptoms include mild HA.  No fever, chills, numbness, weakness, slurred speech, facial droop, CP, SOB.  No aggravating factors.   Past Medical History  Diagnosis Date  . Hypertension   . Hyperlipidemia   . Glaucoma    Past Surgical History  Procedure Laterality Date  . Abdominal hysterectomy  1975  . Corneal transplant      2016 (left) 2014 (right)    Family History  Problem Relation Age of Onset  . Hypertension Mother     died of cardiac arrest age 80  . Colon cancer Daughter     died age 44   Social History  Substance Use Topics  . Smoking status: Former Smoker -- 10 years  . Smokeless tobacco: None  . Alcohol Use: No   OB History    No data available     Review of Systems All other systems negative unless otherwise stated in HPI    Allergies  Review of patient's allergies indicates no known allergies.  Home Medications   Prior to Admission medications   Medication Sig Start Date End Date Taking?  Authorizing Provider  atorvastatin (LIPITOR) 10 MG tablet Take 10 mg by mouth daily.   Yes Historical Provider, MD  gabapentin (NEURONTIN) 300 MG capsule Take 300 mg by mouth at bedtime.   Yes Historical Provider, MD  metoprolol succinate (TOPROL XL) 25 MG 24 hr tablet Take 1 tablet (25 mg total) by mouth daily. 09/16/15  Yes Dorothy Spark, MD  bimatoprost (LUMIGAN) 0.01 % SOLN Place 1 drop into the right eye nightly    Historical Provider, MD  brimonidine-timolol (COMBIGAN) 0.2-0.5 % ophthalmic solution Place 1 drop into both eyes twice a day    Historical Provider, MD  dorzolamide (TRUSOPT) 2 % ophthalmic solution Place 1 drop into the right eye daily    Historical Provider, MD  doxycycline (VIBRA-TABS) 100 MG tablet Take 1 tablet (100 mg total) by mouth 2 (two) times daily. 09/24/15   Renee A Kuneff, DO  erythromycin ophthalmic ointment Place into the left eye nightly    Historical Provider, MD  furosemide (LASIX) 20 MG tablet Take 1 tablet (20 mg total) by mouth daily as needed for edema. 09/16/15   Dorothy Spark, MD  loteprednol (LOTEMAX) 0.5 % ophthalmic suspension  09/24/15   Historical Provider, MD  prednisoLONE acetate (PRED FORTE) 1 % ophthalmic suspension Place 1 drop into the left eye twice a day    Historical Provider, MD   BP 141/67  mmHg  Pulse 58  Temp(Src) 98.9 F (37.2 C) (Oral)  Resp 16  Ht 5\' 4"  (1.626 m)  Wt 63.504 kg  BMI 24.02 kg/m2  SpO2 100% Physical Exam  Constitutional: She is oriented to person, place, and time. She appears well-developed and well-nourished.  Non-toxic appearance. She does not have a sickly appearance. She does not appear ill.  HENT:  Head: Normocephalic and atraumatic.  Mouth/Throat: Oropharynx is clear and moist.  Eyes: Conjunctivae and EOM are normal. Pupils are equal, round, and reactive to light.  Decreased peripheral vision in left eye compared to right.  IOP 12 in left eye.  Corneal sutures in noted.   Neck: Normal range of motion.  Neck supple.  Cardiovascular: Normal rate and regular rhythm.   Pulmonary/Chest: Effort normal and breath sounds normal. No accessory muscle usage or stridor. No respiratory distress. She has no wheezes. She has no rhonchi. She has no rales.  Abdominal: Soft. Bowel sounds are normal. She exhibits no distension.  Musculoskeletal: Normal range of motion.  Lymphadenopathy:    She has no cervical adenopathy.  Neurological: She is alert and oriented to person, place, and time.  Mental Status:   AOx3.  Speech clear without dysarthria. Cranial Nerves:  I-not tested  II-PER  III, IV, VI-EOMs intact  V-temporal and masseter strength intact  VII-symmetrical facial movements intact, no facial droop  VIII-hearing grossly intact bilaterally  IX, X-gag intact  XI-strength of sternomastoid and trapezius muscles 5/5  XII-tongue midline Motor:   Good muscle bulk and tone  Strength 5/5 bilaterally in upper and lower extremities   Cerebellar--finger to nose intact bilaterally.    No pronator drift Sensory:  Intact in upper and lower extremities   Skin: Skin is warm and dry.  Psychiatric: She has a normal mood and affect. Her behavior is normal.    ED Course  Procedures (including critical care time) Labs Review Labs Reviewed - No data to display  Imaging Review No results found. I have personally reviewed and evaluated these images and lab results as part of my medical decision-making.   EKG Interpretation None      MDM   Final diagnoses:  Blurred vision, left eye   Patient with extensive ocular history presents with left eye blurred vision.  VSS, NAD.  Hx of left corneal transplant in Nov 2016.  Seen by ophthalmology at [facility].  Associated eye pain, none at present and intermittent HA.  On exam, corneal sutures observed.  EOMs intact.  Decreased peripheral vision.  Poor visual acuity, however, this is chronic.  IOP of left eye 12.  Normal neurologic exam otherwise.  Spoke with on call  ophthalmologist at [facility], Dr. Cleda Mccreedy.  Recommend urgent ophthalmology visit.  I have low suspicion for CVA or TIA or other neurologic etiology.  Will transfer by POV to [facility] ED, accepting physician Dr. Cleda Mccreedy.  EMTALA has been completed.  Discussed this with the patient and she agrees and acknowledges the above plan.  Case has been discussed with Dr. Tamera Punt who agrees with the above plan for transfer.       Gloriann Loan, PA-C 09/27/15 [place], MD 09/27/15 [phone]

## 2015-09-27 NOTE — ED Notes (Signed)
Pt and daughter verbalized understanding of going straight to [facility] ER. Informed that I've spoken with the charge nurse, Suezanne Jacquet, and they're expecting her. Pt has packet of medical information, including transfer reports, consent to transfer and EMTALA to take to [facility].

## 2015-09-29 ENCOUNTER — Encounter: Payer: Self-pay | Admitting: Family

## 2015-09-29 ENCOUNTER — Telehealth: Payer: Self-pay | Admitting: Family

## 2015-09-29 ENCOUNTER — Ambulatory Visit (INDEPENDENT_AMBULATORY_CARE_PROVIDER_SITE_OTHER): Payer: Medicare Other | Admitting: Family

## 2015-09-29 VITALS — BP 123/72 | HR 67 | Temp 99.0°F | Resp 18 | Ht 65.0 in | Wt 148.4 lb

## 2015-09-29 DIAGNOSIS — H547 Unspecified visual loss: Secondary | ICD-10-CM

## 2015-09-29 DIAGNOSIS — J069 Acute upper respiratory infection, unspecified: Secondary | ICD-10-CM | POA: Diagnosis not present

## 2015-09-29 NOTE — Progress Notes (Signed)
Pre visit review using our clinic review tool, if applicable. No additional management support is needed unless otherwise documented below in the visit note. 

## 2015-09-29 NOTE — Telephone Encounter (Signed)
Spoke with Triage nurse, Burnett Kanaris at [facility] and gave report of need for earlier appt. She states they will contact pt and can recommend that pt be seen tomorrow in the urgent clinic. Gave nurse daughter's contact #.

## 2015-09-29 NOTE — Telephone Encounter (Signed)
Called Dr Serita Grit office and left message on nurse triage voicemail (call back w/in 2 hrs) to call me back re: earlier appt ASAP.

## 2015-09-29 NOTE — Telephone Encounter (Signed)
Could you please contact Pt's opthalmologist's office at [facility], Dr. Carren Rang and see if they can move up her appointment ASAP, pt was sent to [facility] ED due to worsening blurred vision and daughter signed her out because she could not wait to be seen.  Please ask them to contact pt's daughter to schedule her appointment.

## 2015-09-29 NOTE — Progress Notes (Signed)
Subjective:    Patient ID: Mary Trevino, female    DOB: 03-Sep-1926, 82 y.o.   MRN: [id number]  HPI   Ms. Mary Trevino is an 82 yr old female who presents today for follow up.  She was seen on 09/24/15 by Dr. Raoul Pitch with 3 day hx of URI symptoms. Temp that day was 100.8.  She was placed on empiric doxy. CXR noted tiny bilateral pleural effusions but no pulmonary infiltrate.  Overall feeling better, mild congestion, mild cough only.    She presented to the ED on 09/27/15 with blurred vision. She was transferred to [facility] ED due to her opthalmic history.  She is seen by Dr. Carren Rang (opthalmology). Opthalmology note reviewed from 5/17- they felt that pt should be referred to retinal specialist due to cystic macular edema.  She has an appointment 7/17 retinal specialist, 7/27 is her opthalmology appointment. Daughter tells me that they signed her out AMA because she could not wait 5 hours to be seen due to her job. Daughter tells me that the patient had an appointment this past Friday which they canceled because of job conflict.  Pt reports that her vision remains blurred.    Review of Systems See HPI  Past Medical History  Diagnosis Date  . Hypertension   . Hyperlipidemia   . Glaucoma      Social History   Social History  . Marital Status: Widowed    Spouse Name: N/A  . Number of Children: N/A  . Years of Education: N/A   Occupational History  . Not on file.   Social History Main Topics  . Smoking status: Former Smoker -- 10 years  . Smokeless tobacco: Not on file  . Alcohol Use: No  . Drug Use: No  . Sexual Activity: Not on file   Other Topics Concern  . 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 Abdominal  hysterectomy  1975  . Corneal transplant      2016 (left) 2014 (right)     Family History  Problem Relation Age of Onset  . Hypertension Mother     died of cardiac arrest age 23  . Colon cancer Daughter     died age 68    No Known Allergies  Current Outpatient Prescriptions on File Prior to Visit  Medication Sig Dispense Refill  . atorvastatin (LIPITOR) 10 MG tablet Take 10 mg by mouth daily.    . bimatoprost (LUMIGAN) 0.01 % SOLN Place 1 drop into the right eye nightly    . brimonidine-timolol (COMBIGAN) 0.2-0.5 % ophthalmic solution Place 1 drop into both eyes twice a day    . dorzolamide (TRUSOPT) 2 % ophthalmic solution Place 1 drop into the right eye daily    . doxycycline (VIBRA-TABS) 100 MG tablet Take 1 tablet (100 mg total) by mouth 2 (two) times daily. 20 tablet 0  . erythromycin ophthalmic ointment Place into the left eye nightly    . furosemide (LASIX) 20 MG tablet Take 1 tablet (20 mg total) by mouth daily as needed for edema. 30 tablet 1  . gabapentin (NEURONTIN) 300 MG capsule Take 300 mg by mouth at bedtime.    [name]  loteprednol (LOTEMAX) 0.5 % ophthalmic suspension     . metoprolol succinate (TOPROL XL) 25 MG 24 hr tablet Take 1 tablet (25 mg total) by mouth daily. 30 tablet 1  . prednisoLONE acetate (PRED FORTE) 1 % ophthalmic suspension Place 1 drop into the left eye twice a day     No current facility-administered medications on file prior to visit.    BP 123/72 mmHg  Pulse 67  Temp(Src) 99 F (37.2 C) (Oral)  Resp 18  Ht 5\' 5"  (1.651 m)  Wt 148 lb 6.4 oz (67.314 kg)  BMI 24.70 kg/m2  SpO2 100%       Objective:   Physical Exam  Constitutional: She appears well-developed and well-nourished.  HENT:  Head: Normocephalic and atraumatic.  Right Ear: Tympanic membrane and ear canal normal.  Left Ear: Tympanic membrane and ear canal normal.  Cardiovascular: Normal rate, regular rhythm and normal heart sounds.   No murmur heard. Pulmonary/Chest: Effort  normal and breath sounds normal. No respiratory distress. She has no wheezes.  Psychiatric: She has a normal Trevino and affect. Her behavior is normal. Judgment and thought content normal.          Assessment & Plan:  URI- clinically improved. Advised pt to complete doxycyline.  Vision problem- spoke with pt and daughter and discussed with them that I am very concerned about her vision changes and that it is very important that she see opthalmology for further evaluation. Daughter is not sure when she will be available to bring her.  I will ask our office to contact her opthalmologist to see if they can move up her appointment.

## 2015-09-29 NOTE — Patient Instructions (Signed)
Please complete the antibiotic (doxycycline). We will see if the opthalmology office can move up your appointment.   Follow up in 3 months.

## 2015-09-30 DIAGNOSIS — H59032 Cystoid macular edema following cataract surgery, left eye: Secondary | ICD-10-CM | POA: Diagnosis not present

## 2015-09-30 DIAGNOSIS — H35352 Cystoid macular degeneration, left eye: Secondary | ICD-10-CM | POA: Diagnosis not present

## 2015-09-30 DIAGNOSIS — Z9889 Other specified postprocedural states: Secondary | ICD-10-CM | POA: Diagnosis not present

## 2015-09-30 DIAGNOSIS — H02831 Dermatochalasis of right upper eyelid: Secondary | ICD-10-CM | POA: Diagnosis not present

## 2015-09-30 DIAGNOSIS — I10 Essential (primary) hypertension: Secondary | ICD-10-CM | POA: Diagnosis not present

## 2015-09-30 DIAGNOSIS — Z87891 Personal history of nicotine dependence: Secondary | ICD-10-CM | POA: Diagnosis not present

## 2015-09-30 DIAGNOSIS — Z947 Corneal transplant status: Secondary | ICD-10-CM | POA: Diagnosis not present

## 2015-09-30 DIAGNOSIS — H02834 Dermatochalasis of left upper eyelid: Secondary | ICD-10-CM | POA: Diagnosis not present

## 2015-09-30 DIAGNOSIS — H402234 Chronic angle-closure glaucoma, bilateral, indeterminate stage: Secondary | ICD-10-CM | POA: Diagnosis not present

## 2015-09-30 DIAGNOSIS — Z79899 Other long term (current) drug therapy: Secondary | ICD-10-CM | POA: Diagnosis not present

## 2015-09-30 DIAGNOSIS — E7800 Pure hypercholesterolemia, unspecified: Secondary | ICD-10-CM | POA: Diagnosis not present

## 2015-09-30 DIAGNOSIS — H47293 Other optic atrophy, bilateral: Secondary | ICD-10-CM | POA: Diagnosis not present

## 2015-10-15 ENCOUNTER — Telehealth (HOSPITAL_COMMUNITY): Payer: Self-pay | Admitting: *Deleted

## 2015-10-15 NOTE — Telephone Encounter (Signed)
Patient given detailed instructions per Myocardial Perfusion Study Information Sheet for the test on 10/20/15 Patient notified to arrive 15 minutes early and that it is imperative to arrive on time for appointment to keep from having the test rescheduled.  If you need to cancel or reschedule your appointment, please call the office within 24 hours of your appointment. Failure to do so may result in a cancellation of your appointment, and a $50 no show fee. Patient verbalized understanding. Hubbard Robinson, RN

## 2015-10-17 ENCOUNTER — Other Ambulatory Visit: Payer: Self-pay | Admitting: *Deleted

## 2015-10-17 DIAGNOSIS — R072 Precordial pain: Secondary | ICD-10-CM

## 2015-10-17 DIAGNOSIS — R55 Syncope and collapse: Secondary | ICD-10-CM

## 2015-10-17 DIAGNOSIS — R600 Localized edema: Secondary | ICD-10-CM

## 2015-10-17 DIAGNOSIS — E785 Hyperlipidemia, unspecified: Secondary | ICD-10-CM

## 2015-10-17 MED ORDER — METOPROLOL SUCCINATE ER 25 MG PO TB24
25.0000 mg | ORAL_TABLET | Freq: Every day | ORAL | Status: DC
Start: 2015-10-17 — End: 2017-01-31

## 2015-10-20 ENCOUNTER — Ambulatory Visit (HOSPITAL_BASED_OUTPATIENT_CLINIC_OR_DEPARTMENT_OTHER): Payer: Medicare Other

## 2015-10-20 ENCOUNTER — Other Ambulatory Visit: Payer: Self-pay

## 2015-10-20 ENCOUNTER — Ambulatory Visit (HOSPITAL_COMMUNITY): Payer: Medicare Other | Attending: Cardiology

## 2015-10-20 DIAGNOSIS — R9439 Abnormal result of other cardiovascular function study: Secondary | ICD-10-CM | POA: Insufficient documentation

## 2015-10-20 DIAGNOSIS — R600 Localized edema: Secondary | ICD-10-CM | POA: Insufficient documentation

## 2015-10-20 DIAGNOSIS — I351 Nonrheumatic aortic (valve) insufficiency: Secondary | ICD-10-CM | POA: Insufficient documentation

## 2015-10-20 DIAGNOSIS — R55 Syncope and collapse: Secondary | ICD-10-CM

## 2015-10-20 DIAGNOSIS — R072 Precordial pain: Secondary | ICD-10-CM

## 2015-10-20 DIAGNOSIS — E785 Hyperlipidemia, unspecified: Secondary | ICD-10-CM

## 2015-10-20 DIAGNOSIS — R079 Chest pain, unspecified: Secondary | ICD-10-CM

## 2015-10-20 DIAGNOSIS — I340 Nonrheumatic mitral (valve) insufficiency: Secondary | ICD-10-CM | POA: Diagnosis not present

## 2015-10-20 DIAGNOSIS — I119 Hypertensive heart disease without heart failure: Secondary | ICD-10-CM | POA: Insufficient documentation

## 2015-10-20 DIAGNOSIS — I371 Nonrheumatic pulmonary valve insufficiency: Secondary | ICD-10-CM | POA: Diagnosis not present

## 2015-10-20 LAB — MYOCARDIAL PERFUSION IMAGING
LV dias vol: 70 mL (ref 46–106)
LV sys vol: 28 mL
Peak HR: 100 {beats}/min
RATE: 0.25
Rest HR: 53 {beats}/min
SDS: 1
SRS: 2
SSS: 3
TID: 0.97

## 2015-10-20 MED ORDER — TECHNETIUM TC 99M TETROFOSMIN IV KIT
32.7000 | PACK | Freq: Once | INTRAVENOUS | Status: AC | PRN
  Administered 2015-10-20: 32.7 via INTRAVENOUS
  Filled 2015-10-20: qty 33

## 2015-10-20 MED ORDER — REGADENOSON 0.4 MG/5ML IV SOLN
0.4000 mg | Freq: Once | INTRAVENOUS | Status: AC
  Administered 2015-10-20: 0.4 mg via INTRAVENOUS

## 2015-10-20 MED ORDER — TECHNETIUM TC 99M TETROFOSMIN IV KIT
10.3000 | PACK | Freq: Once | INTRAVENOUS | Status: AC | PRN
  Administered 2015-10-20: 10 via INTRAVENOUS
  Filled 2015-10-20: qty 10

## 2015-10-23 ENCOUNTER — Encounter: Payer: Self-pay | Admitting: Family

## 2015-10-23 ENCOUNTER — Ambulatory Visit (INDEPENDENT_AMBULATORY_CARE_PROVIDER_SITE_OTHER): Payer: Medicare Other | Admitting: Family

## 2015-10-23 ENCOUNTER — Ambulatory Visit (HOSPITAL_BASED_OUTPATIENT_CLINIC_OR_DEPARTMENT_OTHER)
Admission: RE | Admit: 2015-10-23 | Discharge: 2015-10-23 | Disposition: A | Discharge status: Home / Self Care | Payer: Medicare Other | Source: Ambulatory Visit | Attending: Family | Admitting: Family

## 2015-10-23 ENCOUNTER — Telehealth: Payer: Self-pay

## 2015-10-23 VITALS — BP 139/57 | HR 84 | Temp 98.6°F | Ht 65.0 in | Wt 146.4 lb

## 2015-10-23 DIAGNOSIS — R059 Cough, unspecified: Secondary | ICD-10-CM

## 2015-10-23 DIAGNOSIS — I517 Cardiomegaly: Secondary | ICD-10-CM | POA: Diagnosis not present

## 2015-10-23 DIAGNOSIS — R05 Cough: Secondary | ICD-10-CM | POA: Diagnosis present

## 2015-10-23 DIAGNOSIS — R509 Fever, unspecified: Secondary | ICD-10-CM | POA: Diagnosis not present

## 2015-10-23 MED ORDER — BENZONATATE 100 MG PO CAPS: 100.0000 mg | ORAL_CAPSULE | Freq: Three times a day (TID) | ORAL | Status: DC | PRN

## 2015-10-23 NOTE — Patient Instructions (Signed)
Please complete x ray on the first floor. You may use tessalon as needed for cough. Please call if new/worsening symptoms or if you develop fever >101. Call if cough is not improved in 1 week.

## 2015-10-23 NOTE — Assessment & Plan Note (Signed)
Likely viral etiology. CXR neg for PNA. Rx provided for tessalon. Pt advised as follows:   Please call if new/worsening symptoms or if you develop fever >101. Call if cough is not improved in 1 week.

## 2015-10-23 NOTE — Progress Notes (Signed)
Subjective:    Patient ID: Mary Trevino, female    DOB: 03-21-1927, 82 y.o.   MRN: [id number]  HPI  Mary Trevino is an 82 yr old female who presents today with chief complaint of cough.  Cough began last night.  Reports that she had temp of 99.5 at home.  Reports that cough has been mildly productive. She reports + associated fatigue and body aches. Denies associated nasal congestion, ear pain or sore throat.  She denies sick contacts.    Review of Systems    see HPI  Past Medical History  Diagnosis Date  . Hypertension   . Hyperlipidemia   . Glaucoma      Social History   Social History  . Marital Status: Widowed    Spouse Name: N/A  . Number of Children: N/A  . Years of Education: N/A   Occupational History  . Not on file.   Social History Main Topics  . Smoking status: Former Smoker -- 10 years  . Smokeless tobacco: Not on file  . Alcohol Use: No  . Drug Use: No  . Sexual Activity: Not on file   Other Topics Concern  . 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 Abdominal hysterectomy  1975  . Corneal transplant      2016 (left) 2014 (right)     Family History  Problem Relation Age of Onset  . Hypertension Mother     died of cardiac arrest age 48  . Colon cancer Daughter     died age 22    No Known Allergies  Current Outpatient Prescriptions on File Prior to Visit  Medication Sig Dispense Refill  . atorvastatin (LIPITOR) 10 MG tablet Take 10 mg by mouth daily.    . bimatoprost (LUMIGAN) 0.01 % SOLN Place 1 drop into the right eye nightly    . brimonidine-timolol (COMBIGAN) 0.2-0.5 % ophthalmic solution Place 1 drop into both eyes twice a day    . dorzolamide (TRUSOPT) 2 % ophthalmic solution  Place 1 drop into the right eye daily    . doxycycline (VIBRA-TABS) 100 MG tablet Take 1 tablet (100 mg total) by mouth 2 (two) times daily. 20 tablet 0  . erythromycin ophthalmic ointment Place into the left eye nightly    . furosemide (LASIX) 20 MG tablet Take 1 tablet (20 mg total) by mouth daily as needed for edema. 30 tablet 1  . gabapentin (NEURONTIN) 300 MG capsule Take 300 mg by mouth at bedtime.    [name] loteprednol (LOTEMAX) 0.5 % ophthalmic suspension     . metoprolol succinate (TOPROL XL) 25 MG 24 hr tablet Take 1 tablet (25 mg total) by mouth daily. 90 tablet 3  . prednisoLONE acetate (PRED FORTE) 1 % ophthalmic suspension Place 1 drop into the left eye twice a day     No current facility-administered medications on file prior to visit.    BP 139/57 mmHg  Pulse 84  Temp(Src) 98.6 F (37 C) (Oral)  Ht 5\' 5"  (1.651 m)  Wt 146 lb 6.4 oz (66.407 kg)  BMI 24.36 kg/m2  SpO2 99%    Objective:   Physical Exam  Constitutional: She  is oriented to person, place, and time. She appears well-developed and well-nourished.  HENT:  Head: Normocephalic and atraumatic.  Right Ear: Tympanic membrane and ear canal normal.  Left Ear: Tympanic membrane and ear canal normal.  Mouth/Throat: No oropharyngeal exudate, posterior oropharyngeal edema or posterior oropharyngeal erythema.  Cardiovascular: Normal rate and regular rhythm.   No murmur heard. Pulmonary/Chest: Effort normal and breath sounds normal. No respiratory distress. She has no wheezes. She has no rales.  Musculoskeletal: She exhibits no edema.  Neurological: She is alert and oriented to person, place, and time.  Skin: Skin is warm and dry.  Psychiatric: She has a normal mood and affect. Her behavior is normal. Judgment and thought content normal.          Assessment & Plan:

## 2015-10-24 ENCOUNTER — Ambulatory Visit: Payer: Medicare Other | Admitting: Audiology

## 2015-10-24 ENCOUNTER — Other Ambulatory Visit: Payer: Self-pay

## 2015-10-27 ENCOUNTER — Ambulatory Visit: Payer: Medicare Other | Admitting: Audiology

## 2015-10-27 DIAGNOSIS — H402234 Chronic angle-closure glaucoma, bilateral, indeterminate stage: Secondary | ICD-10-CM | POA: Diagnosis not present

## 2015-10-27 DIAGNOSIS — Z87891 Personal history of nicotine dependence: Secondary | ICD-10-CM | POA: Diagnosis not present

## 2015-10-27 DIAGNOSIS — H2640 Unspecified secondary cataract: Secondary | ICD-10-CM | POA: Diagnosis not present

## 2015-10-27 DIAGNOSIS — H17823 Peripheral opacity of cornea, bilateral: Secondary | ICD-10-CM | POA: Diagnosis not present

## 2015-10-27 DIAGNOSIS — H353190 Nonexudative age-related macular degeneration, unspecified eye, stage unspecified: Secondary | ICD-10-CM | POA: Diagnosis not present

## 2015-10-27 DIAGNOSIS — H04121 Dry eye syndrome of right lacrimal gland: Secondary | ICD-10-CM | POA: Diagnosis not present

## 2015-11-03 DIAGNOSIS — Z87891 Personal history of nicotine dependence: Secondary | ICD-10-CM | POA: Diagnosis not present

## 2015-11-03 DIAGNOSIS — H30033 Focal chorioretinal inflammation, peripheral, bilateral: Secondary | ICD-10-CM | POA: Diagnosis not present

## 2015-11-03 DIAGNOSIS — E7800 Pure hypercholesterolemia, unspecified: Secondary | ICD-10-CM | POA: Diagnosis not present

## 2015-11-03 DIAGNOSIS — H3530 Unspecified macular degeneration: Secondary | ICD-10-CM | POA: Diagnosis not present

## 2015-11-03 DIAGNOSIS — Z961 Presence of intraocular lens: Secondary | ICD-10-CM | POA: Diagnosis not present

## 2015-11-03 DIAGNOSIS — H402234 Chronic angle-closure glaucoma, bilateral, indeterminate stage: Secondary | ICD-10-CM | POA: Diagnosis not present

## 2015-11-03 DIAGNOSIS — I10 Essential (primary) hypertension: Secondary | ICD-10-CM | POA: Diagnosis not present

## 2015-11-03 DIAGNOSIS — H3581 Retinal edema: Secondary | ICD-10-CM | POA: Diagnosis not present

## 2015-11-03 DIAGNOSIS — Z79899 Other long term (current) drug therapy: Secondary | ICD-10-CM | POA: Diagnosis not present

## 2015-11-03 DIAGNOSIS — Z947 Corneal transplant status: Secondary | ICD-10-CM | POA: Diagnosis not present

## 2015-11-03 DIAGNOSIS — Z0101 Encounter for examination of eyes and vision with abnormal findings: Secondary | ICD-10-CM | POA: Diagnosis not present

## 2015-11-12 ENCOUNTER — Ambulatory Visit (INDEPENDENT_AMBULATORY_CARE_PROVIDER_SITE_OTHER): Payer: Medicare Other | Admitting: Family

## 2015-11-12 ENCOUNTER — Encounter: Payer: Self-pay | Admitting: Family

## 2015-11-12 VITALS — BP 122/62 | HR 63 | Temp 98.8°F | Ht 65.0 in | Wt 150.4 lb

## 2015-11-12 DIAGNOSIS — I10 Essential (primary) hypertension: Secondary | ICD-10-CM | POA: Diagnosis not present

## 2015-11-12 DIAGNOSIS — Z111 Encounter for screening for respiratory tuberculosis: Secondary | ICD-10-CM | POA: Diagnosis not present

## 2015-11-12 DIAGNOSIS — J209 Acute bronchitis, unspecified: Secondary | ICD-10-CM | POA: Diagnosis not present

## 2015-11-12 DIAGNOSIS — H548 Legal blindness, as defined in USA: Secondary | ICD-10-CM | POA: Insufficient documentation

## 2015-11-12 DIAGNOSIS — H409 Unspecified glaucoma: Secondary | ICD-10-CM | POA: Diagnosis not present

## 2015-11-12 NOTE — Patient Instructions (Signed)
Follow up as scheduled in September.  Enjoy the rest of your summer!

## 2015-11-12 NOTE — Assessment & Plan Note (Signed)
BP stable on current medications. Continue same.  

## 2015-11-12 NOTE — Progress Notes (Signed)
Subjective:    Patient ID: Mary Trevino, female    DOB: 01/07/1927, 82 y.o.   MRN: [id number]  HPI   Mary Trevino is an 82 yr old female who presents today for [facility] for Respite Care. She is accompanied by her daughter who is her primary care giver.  She has no complaints today she reports that her respiratory illness that she was seen for last visit resolved.   HTN- maintained on lasix and metoprolol.    Review of Systems    see HPI  Past Medical History:  Diagnosis Date  . Glaucoma   . Hyperlipidemia   . Hypertension      Social History   Social History  . Marital status: Widowed    Spouse name: N/A  . Number of children: N/A  . Years of education: N/A   Occupational History  . Not on file.   Social History Main Topics  . Smoking status: Former Smoker    Years: 10.00  . Smokeless tobacco: Not on file  . Alcohol use No  . Drug use: No  . Sexual activity: Not on file   Other Topics Concern  . 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 ABDOMINAL HYSTERECTOMY  1975  . CORNEAL TRANSPLANT     2016 (left) 2014 (right)     Family History  Problem Relation Age of Onset  . Hypertension Mother     died of cardiac arrest age 79  . Colon cancer Daughter     died age 29    No Known Allergies  Current Outpatient Prescriptions on File Prior to Visit  Medication Sig Dispense Refill  . atorvastatin (LIPITOR) 10 MG tablet Take 10 mg by mouth daily.    . bimatoprost (LUMIGAN) 0.01 % SOLN Place 1 drop into the right eye nightly    . brimonidine-timolol (COMBIGAN) 0.2-0.5 % ophthalmic solution Place 1 drop into both eyes twice a day    . dorzolamide (TRUSOPT) 2 % ophthalmic solution Place 1 drop into the right eye  daily    . erythromycin ophthalmic ointment Place into the left eye nightly    . furosemide (LASIX) 20 MG tablet Take 1 tablet (20 mg total) by mouth daily as needed for edema. 30 tablet 1  . gabapentin (NEURONTIN) 300 MG capsule Take 300 mg by mouth at bedtime.    [name] loteprednol (LOTEMAX) 0.5 % ophthalmic suspension     . metoprolol succinate (TOPROL XL) 25 MG 24 hr tablet Take 1 tablet (25 mg total) by mouth daily. 90 tablet 3  . prednisoLONE acetate (PRED FORTE) 1 % ophthalmic suspension Place 1 drop into the left eye twice a day     No current facility-administered medications on file prior to visit.     BP 122/62   Pulse 63   Temp 98.8 F (37.1 C) (Oral)   Ht 5\' 5"  (1.651 m)   Wt 150 lb 6.4 oz (68.2 kg)   SpO2 96% Comment: room air  BMI 25.03 kg/m    Objective:   Physical Exam  Constitutional: She is oriented to person, place, and time. She appears well-developed and well-nourished.  Cardiovascular: Normal rate, regular  rhythm and normal heart sounds.   No murmur heard. Pulmonary/Chest: Effort normal and breath sounds normal. No respiratory distress. She has no wheezes.  Musculoskeletal:  Trace bilateral LE edema  Neurological: She is alert and oriented to person, place, and time.  Psychiatric: She has a normal mood and affect. Her behavior is normal. Judgment and thought content normal.          Assessment & Plan:  FL2 form completed for Respite Care.  PPD placed today. She will return for PPD reading on Friday.

## 2015-11-12 NOTE — Assessment & Plan Note (Signed)
Followed by opthalmology. She is maintained on multiple eye drops.

## 2015-11-12 NOTE — Progress Notes (Signed)
Pre visit review using our clinic review tool, if applicable. No additional management support is needed unless otherwise documented below in the visit note. 

## 2015-11-12 NOTE — Assessment & Plan Note (Signed)
Resolved

## 2015-11-14 ENCOUNTER — Encounter: Payer: Self-pay | Admitting: *Deleted

## 2015-11-14 LAB — TB SKIN TEST
Induration: 0 mm
TB Skin Test: NEGATIVE

## 2015-11-18 ENCOUNTER — Telehealth: Payer: Self-pay | Admitting: Family

## 2015-11-18 NOTE — Telephone Encounter (Signed)
Pt's daughter dropped off paperwork to be filled out by provider (Complementary Paratransit Application -Part B- and Autorization for Release of info) 4 pages. Documents put at front office tray.

## 2015-11-20 DIAGNOSIS — H30033 Focal chorioretinal inflammation, peripheral, bilateral: Secondary | ICD-10-CM | POA: Diagnosis not present

## 2015-11-20 DIAGNOSIS — H02831 Dermatochalasis of right upper eyelid: Secondary | ICD-10-CM | POA: Diagnosis not present

## 2015-11-20 DIAGNOSIS — Z961 Presence of intraocular lens: Secondary | ICD-10-CM | POA: Diagnosis not present

## 2015-11-20 DIAGNOSIS — H402234 Chronic angle-closure glaucoma, bilateral, indeterminate stage: Secondary | ICD-10-CM | POA: Diagnosis not present

## 2015-11-20 DIAGNOSIS — H3581 Retinal edema: Secondary | ICD-10-CM | POA: Diagnosis not present

## 2015-11-20 DIAGNOSIS — E11311 Type 2 diabetes mellitus with unspecified diabetic retinopathy with macular edema: Secondary | ICD-10-CM | POA: Diagnosis not present

## 2015-11-20 DIAGNOSIS — H02834 Dermatochalasis of left upper eyelid: Secondary | ICD-10-CM | POA: Diagnosis not present

## 2015-12-01 NOTE — Telephone Encounter (Signed)
Called and [facility] @ 11:60m @ [phone]) asking the pt to RTC regarding completed paperwork.//AB/CMA

## 2015-12-04 NOTE — Telephone Encounter (Signed)
Called and spoke with the pt's daughter and informed her that the paperwork is completed and ready to be picked up.  The daughter stated that they are out of town and will came by next week to pick up the paperwork.  Placed the paperwork up front.//AB/CMA

## 2015-12-11 DIAGNOSIS — H02835 Dermatochalasis of left lower eyelid: Secondary | ICD-10-CM | POA: Diagnosis not present

## 2015-12-11 DIAGNOSIS — H02832 Dermatochalasis of right lower eyelid: Secondary | ICD-10-CM | POA: Diagnosis not present

## 2015-12-11 DIAGNOSIS — H402234 Chronic angle-closure glaucoma, bilateral, indeterminate stage: Secondary | ICD-10-CM | POA: Diagnosis not present

## 2015-12-11 DIAGNOSIS — H02831 Dermatochalasis of right upper eyelid: Secondary | ICD-10-CM | POA: Diagnosis not present

## 2015-12-11 DIAGNOSIS — H3581 Retinal edema: Secondary | ICD-10-CM | POA: Diagnosis not present

## 2015-12-11 DIAGNOSIS — H02834 Dermatochalasis of left upper eyelid: Secondary | ICD-10-CM | POA: Diagnosis not present

## 2015-12-11 DIAGNOSIS — H30033 Focal chorioretinal inflammation, peripheral, bilateral: Secondary | ICD-10-CM | POA: Diagnosis not present

## 2015-12-11 DIAGNOSIS — Z961 Presence of intraocular lens: Secondary | ICD-10-CM | POA: Diagnosis not present

## 2015-12-12 DIAGNOSIS — I609 Nontraumatic subarachnoid hemorrhage, unspecified: Secondary | ICD-10-CM

## 2015-12-12 HISTORY — DX: Nontraumatic subarachnoid hemorrhage, unspecified: I60.9

## 2015-12-16 ENCOUNTER — Ambulatory Visit: Payer: Medicare Other | Admitting: *Deleted

## 2015-12-16 ENCOUNTER — Encounter: Payer: Self-pay | Admitting: Cardiology

## 2015-12-18 DIAGNOSIS — S169XXA Unspecified injury of muscle, fascia and tendon at neck level, initial encounter: Secondary | ICD-10-CM | POA: Diagnosis not present

## 2015-12-18 DIAGNOSIS — M549 Dorsalgia, unspecified: Secondary | ICD-10-CM | POA: Diagnosis not present

## 2015-12-18 DIAGNOSIS — Z8679 Personal history of other diseases of the circulatory system: Secondary | ICD-10-CM | POA: Diagnosis not present

## 2015-12-18 DIAGNOSIS — I10 Essential (primary) hypertension: Secondary | ICD-10-CM | POA: Diagnosis not present

## 2015-12-18 DIAGNOSIS — Z7982 Long term (current) use of aspirin: Secondary | ICD-10-CM | POA: Diagnosis not present

## 2015-12-18 DIAGNOSIS — Z87891 Personal history of nicotine dependence: Secondary | ICD-10-CM | POA: Diagnosis not present

## 2015-12-18 DIAGNOSIS — M542 Cervicalgia: Secondary | ICD-10-CM | POA: Diagnosis not present

## 2015-12-18 DIAGNOSIS — S065X0A Traumatic subdural hemorrhage without loss of consciousness, initial encounter: Secondary | ICD-10-CM | POA: Diagnosis not present

## 2015-12-18 DIAGNOSIS — I609 Nontraumatic subarachnoid hemorrhage, unspecified: Secondary | ICD-10-CM | POA: Diagnosis not present

## 2015-12-18 DIAGNOSIS — S066X0A Traumatic subarachnoid hemorrhage without loss of consciousness, initial encounter: Secondary | ICD-10-CM | POA: Diagnosis not present

## 2015-12-18 DIAGNOSIS — S199XXA Unspecified injury of neck, initial encounter: Secondary | ICD-10-CM | POA: Diagnosis not present

## 2015-12-18 DIAGNOSIS — R51 Headache: Secondary | ICD-10-CM | POA: Diagnosis not present

## 2015-12-19 DIAGNOSIS — W19XXXD Unspecified fall, subsequent encounter: Secondary | ICD-10-CM | POA: Diagnosis not present

## 2015-12-19 DIAGNOSIS — I10 Essential (primary) hypertension: Secondary | ICD-10-CM | POA: Diagnosis present

## 2015-12-19 DIAGNOSIS — M1990 Unspecified osteoarthritis, unspecified site: Secondary | ICD-10-CM | POA: Diagnosis present

## 2015-12-19 DIAGNOSIS — E785 Hyperlipidemia, unspecified: Secondary | ICD-10-CM | POA: Diagnosis present

## 2015-12-19 DIAGNOSIS — Z7982 Long term (current) use of aspirin: Secondary | ICD-10-CM | POA: Diagnosis not present

## 2015-12-19 DIAGNOSIS — H409 Unspecified glaucoma: Secondary | ICD-10-CM | POA: Diagnosis present

## 2015-12-19 DIAGNOSIS — M47812 Spondylosis without myelopathy or radiculopathy, cervical region: Secondary | ICD-10-CM | POA: Diagnosis not present

## 2015-12-19 DIAGNOSIS — S066X0D Traumatic subarachnoid hemorrhage without loss of consciousness, subsequent encounter: Secondary | ICD-10-CM | POA: Diagnosis not present

## 2015-12-19 DIAGNOSIS — Z87891 Personal history of nicotine dependence: Secondary | ICD-10-CM | POA: Diagnosis not present

## 2015-12-19 DIAGNOSIS — Z8679 Personal history of other diseases of the circulatory system: Secondary | ICD-10-CM | POA: Diagnosis not present

## 2015-12-19 DIAGNOSIS — S065X0A Traumatic subdural hemorrhage without loss of consciousness, initial encounter: Secondary | ICD-10-CM | POA: Diagnosis present

## 2015-12-19 DIAGNOSIS — S161XXD Strain of muscle, fascia and tendon at neck level, subsequent encounter: Secondary | ICD-10-CM | POA: Diagnosis not present

## 2015-12-19 DIAGNOSIS — S161XXA Strain of muscle, fascia and tendon at neck level, initial encounter: Secondary | ICD-10-CM | POA: Diagnosis present

## 2015-12-19 DIAGNOSIS — S066X0A Traumatic subarachnoid hemorrhage without loss of consciousness, initial encounter: Secondary | ICD-10-CM | POA: Diagnosis not present

## 2015-12-19 DIAGNOSIS — I609 Nontraumatic subarachnoid hemorrhage, unspecified: Secondary | ICD-10-CM | POA: Diagnosis not present

## 2015-12-21 ENCOUNTER — Telehealth: Payer: Self-pay | Admitting: Family

## 2015-12-21 NOTE — Telephone Encounter (Signed)
Please contact pt's daughter to arrange 1 week hospital follow up (TCM).

## 2015-12-22 NOTE — Telephone Encounter (Signed)
Transition Care Management Follow-up Telephone Call  Per Discharge Summary:   Admit date: 12/18/2015  Discharge date: 12/20/2015   Discharge Service: General Medicine (MED)  Discharge Attending Physician: Antonieta Pert, MD  Discharge to: Home  Discharge Diagnoses: Principal Problem: SAH (subarachnoid hemorrhage) ([facility]-H[facility]) Active Problems: Essential hypertension Arthritis Fall Glaucoma HLD (hyperlipidemia) Subdural hematoma ([facility])  --   How have you been since you were released from the hospital? "Terrible. My back and head are killing me and I can't move my neck from side to side." Pt states that these symptoms started prior to hospitalization when she fell and have not changed since discharge.    Do you understand why you were in the hospital? yes   Do you understand the discharge instructions? yes   Where were you discharged to? Home   Items Reviewed:  Medications reviewed: yes  Allergies reviewed: yes  Dietary changes reviewed: no, none made per pt  Referrals reviewed: yes, Dr. [name] Roan (neurosurgery)   Functional Questionnaire:   Activities of Daily Living (ADLs):   She states they are independent in the following: ambulation, bathing and hygiene, feeding, continence, grooming, toileting and dressing States they require assistance with the following: none   Any transportation issues/concerns?: no   Any patient concerns? no   Confirmed importance and date/time of follow-up visits scheduled yes  Provider Appointment booked with Debbrah Alar, NP 12/23/15 @ 10:30am  Confirmed with patient if condition begins to worsen call PCP or go to the ER.  Patient was given the office number and encouraged to call back with question or concerns.  : yes

## 2015-12-22 NOTE — Telephone Encounter (Signed)
Noted and agree. 

## 2015-12-23 ENCOUNTER — Ambulatory Visit (INDEPENDENT_AMBULATORY_CARE_PROVIDER_SITE_OTHER): Payer: Medicare Other | Admitting: Family

## 2015-12-23 ENCOUNTER — Encounter: Payer: Self-pay | Admitting: Family

## 2015-12-23 VITALS — BP 114/61 | HR 81 | Temp 98.7°F | Resp 20 | Ht 65.0 in | Wt 149.0 lb

## 2015-12-23 DIAGNOSIS — E785 Hyperlipidemia, unspecified: Secondary | ICD-10-CM | POA: Diagnosis not present

## 2015-12-23 DIAGNOSIS — Z23 Encounter for immunization: Secondary | ICD-10-CM | POA: Diagnosis not present

## 2015-12-23 DIAGNOSIS — R739 Hyperglycemia, unspecified: Secondary | ICD-10-CM

## 2015-12-23 DIAGNOSIS — M542 Cervicalgia: Secondary | ICD-10-CM | POA: Diagnosis not present

## 2015-12-23 DIAGNOSIS — I609 Nontraumatic subarachnoid hemorrhage, unspecified: Secondary | ICD-10-CM | POA: Diagnosis not present

## 2015-12-23 DIAGNOSIS — Z8679 Personal history of other diseases of the circulatory system: Secondary | ICD-10-CM | POA: Insufficient documentation

## 2015-12-23 LAB — LIPID PANEL
CHOL/HDL RATIO: 3
Cholesterol: 166 mg/dL (ref 0–200)
HDL: 64.7 mg/dL (ref >39.00)
LDL CALC: 83 mg/dL (ref 0–99)
NONHDL: 101.44
Triglycerides: 94 mg/dL (ref 0.0–149.0)
VLDL: 18.8 mg/dL (ref 0.0–40.0)

## 2015-12-23 LAB — HEMOGLOBIN A1C: Hgb A1c MFr Bld: 5.8 % (ref 4.6–6.5)

## 2015-12-23 MED ORDER — TRAMADOL HCL 50 MG PO TABS: 50.0000 mg | ORAL_TABLET | Freq: Three times a day (TID) | ORAL | 0 refills | Status: DC | PRN

## 2015-12-23 NOTE — Progress Notes (Signed)
Subjective:    Patient ID: Mary Trevino, female    DOB: 05-Feb-1927, 82 y.o.   MRN: [id number]  HPI  Mary Trevino is an 82 yr old female who presents today for hospital follow up. She was hospitalized 9/7-9/9 following a fall out of bed and was diagnosed with SAH.   She was hospitalized at [facility] and discharge summary is reviewed in [place]. She suffered a fall at home and sustained head injury. Patient reports that she was dreaming and fell out of bed, striking her head on the wall.   CT noted SDH. She was evaluated by neurosurgery. Follow up CT head the following morning was stable.  She was provided with a muscle relaxant and tylenol.  She was continued on her home medication and was advised to follow up with PCP and neurosurgery in 2 weeks (with a repeat CT head) as a outpatient.    She reports increased pain in her neck, has increased pain with turning. Using robaxin without improvement.   She has tried tylenol without improvement. No improvement with heat either.  She reports + HA's since she had the fall. HA's are located in the back of her head.    HTN- maintained on toprol xl and lasix.  BP Readings from Last 3 Encounters:  12/23/15 114/61  11/12/15 122/62  10/23/15 (!) 139/57   Hyperlipidemia- on statin, no recent lipids on file.   She did have hyper Review of Systems    see HPI  Past Medical History:  Diagnosis Date  . Glaucoma   . Hyperlipidemia   . Hypertension      Social History   Social History  . Marital status: Widowed    Spouse name: N/A  . Number of children: N/A  . Years of education: N/A   Occupational History  . Not on file.   Social History Main Topics  . Smoking status: Former Smoker    Years: 10.00  . Smokeless tobacco: Never Used  . Alcohol use No  . Drug use: No  . Sexual activity: Not on file   Other Topics Concern  . 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 ABDOMINAL HYSTERECTOMY  1975  . CORNEAL TRANSPLANT     2016 (left) 2014 (right)     Family History  Problem Relation Age of Onset  . Hypertension Mother     died of cardiac arrest age 78  . Colon cancer Daughter     died age 31    No Known Allergies  Current Outpatient Prescriptions on File Prior to Visit  Medication Sig Dispense Refill  . acetaminophen (TYLENOL) 325 MG tablet Take 650 mg by mouth every 4 (four) hours as needed.    [name] atorvastatin (LIPITOR) 10 MG tablet Take 10 mg by mouth daily.    . bimatoprost (LUMIGAN) 0.01 % SOLN Place 1 drop into the right eye nightly    . brimonidine-timolol (COMBIGAN) 0.2-0.5 % ophthalmic solution Place 1 drop into both eyes twice a day    . dorzolamide (TRUSOPT) 2 % ophthalmic solution Place 1 drop into the right eye daily    . furosemide (LASIX) 20 MG tablet  Take 1 tablet (20 mg total) by mouth daily as needed for edema. 30 tablet 1  . gabapentin (NEURONTIN) 300 MG capsule Take 300 mg by mouth at bedtime.    [name] loteprednol (LOTEMAX) 0.5 % ophthalmic suspension     . methocarbamol (ROBAXIN) 500 MG tablet Take 500 mg by mouth 3 (three) times daily as needed.    . metoprolol succinate (TOPROL XL) 25 MG 24 hr tablet Take 1 tablet (25 mg total) by mouth daily. 90 tablet 3  . prednisoLONE acetate (PRED FORTE) 1 % ophthalmic suspension Place 1 drop into the left eye twice a day     No current facility-administered medications on file prior to visit.     BP 114/61 (BP Location: Right Arm, Cuff Size: Large)   Pulse 81   Temp 98.7 F (37.1 C) (Oral)   Resp 20   Ht 5\' 5"  (1.651 m)   Wt 149 lb (67.6 kg)   SpO2 100% Comment: room air  BMI 24.79 kg/m    Objective:   Physical Exam  Constitutional: She is oriented to person, place, and time.  She appears well-developed and well-nourished.  Eyes: EOM are normal.  Cardiovascular: Normal rate, regular rhythm and normal heart sounds.   No murmur heard. Pulmonary/Chest: Effort normal and breath sounds normal. No respiratory distress. She has no wheezes.  Neurological: She is alert and oriented to person, place, and time. She exhibits normal muscle tone. Coordination normal.  Skin: Skin is warm and dry. No rash noted. No erythema. No pallor.  Psychiatric: She has a normal Trevino and affect. Her behavior is normal. Judgment and thought content normal.          Assessment & Plan:  Hyperglycemia- had elevated sugar during hospitalization. Will check A1C.  Hyperlipidemia- check follow up lipid panel.  Neck pain- Uncontrolled. Likely musculoskeletal pain. Reviewed CT neck performed during her hospitalization and this was negative for acute injury but did show degenerative changes.  Will give a short course of tramadol.  We did discuss falls risk prevention.  Patient and daughter feel that she is steady on her feet at home and has everything she needs.

## 2015-12-23 NOTE — Assessment & Plan Note (Signed)
Neurologically stable. Advised pt to follow up as scheduled with neurosurgery.

## 2015-12-23 NOTE — Patient Instructions (Signed)
For neck pain you may use tramadol as needed.  For minor pain you may use tylenol.  Please keep your upcoming appointment with Neurosurgery (Dr. [name] Roan).

## 2015-12-23 NOTE — Telephone Encounter (Signed)
Records never received. Refaxed records release to below #.

## 2015-12-29 DIAGNOSIS — H02831 Dermatochalasis of right upper eyelid: Secondary | ICD-10-CM | POA: Diagnosis not present

## 2015-12-29 DIAGNOSIS — H02834 Dermatochalasis of left upper eyelid: Secondary | ICD-10-CM | POA: Diagnosis not present

## 2015-12-29 DIAGNOSIS — H353110 Nonexudative age-related macular degeneration, right eye, stage unspecified: Secondary | ICD-10-CM | POA: Diagnosis not present

## 2015-12-29 DIAGNOSIS — H268 Other specified cataract: Secondary | ICD-10-CM | POA: Diagnosis not present

## 2015-12-29 DIAGNOSIS — Z947 Corneal transplant status: Secondary | ICD-10-CM | POA: Diagnosis not present

## 2015-12-29 DIAGNOSIS — Z961 Presence of intraocular lens: Secondary | ICD-10-CM | POA: Diagnosis not present

## 2015-12-29 DIAGNOSIS — Z87891 Personal history of nicotine dependence: Secondary | ICD-10-CM | POA: Diagnosis not present

## 2015-12-29 DIAGNOSIS — H402234 Chronic angle-closure glaucoma, bilateral, indeterminate stage: Secondary | ICD-10-CM | POA: Diagnosis not present

## 2015-12-29 DIAGNOSIS — I10 Essential (primary) hypertension: Secondary | ICD-10-CM | POA: Diagnosis not present

## 2015-12-29 DIAGNOSIS — H17822 Peripheral opacity of cornea, left eye: Secondary | ICD-10-CM | POA: Diagnosis not present

## 2015-12-29 DIAGNOSIS — H2640 Unspecified secondary cataract: Secondary | ICD-10-CM | POA: Diagnosis not present

## 2015-12-29 DIAGNOSIS — E7800 Pure hypercholesterolemia, unspecified: Secondary | ICD-10-CM | POA: Diagnosis not present

## 2015-12-29 DIAGNOSIS — H17823 Peripheral opacity of cornea, bilateral: Secondary | ICD-10-CM | POA: Diagnosis not present

## 2015-12-31 ENCOUNTER — Ambulatory Visit: Payer: Medicare Other | Admitting: Cardiology

## 2016-01-01 DIAGNOSIS — S066X9D Traumatic subarachnoid hemorrhage with loss of consciousness of unspecified duration, subsequent encounter: Secondary | ICD-10-CM | POA: Diagnosis not present

## 2016-01-01 DIAGNOSIS — S066X0A Traumatic subarachnoid hemorrhage without loss of consciousness, initial encounter: Secondary | ICD-10-CM | POA: Diagnosis not present

## 2016-01-01 DIAGNOSIS — M542 Cervicalgia: Secondary | ICD-10-CM | POA: Diagnosis not present

## 2016-01-01 DIAGNOSIS — R51 Headache: Secondary | ICD-10-CM | POA: Diagnosis not present

## 2016-01-01 DIAGNOSIS — I6200 Nontraumatic subdural hemorrhage, unspecified: Secondary | ICD-10-CM | POA: Diagnosis not present

## 2016-01-01 DIAGNOSIS — I609 Nontraumatic subarachnoid hemorrhage, unspecified: Secondary | ICD-10-CM | POA: Diagnosis not present

## 2016-01-02 ENCOUNTER — Ambulatory Visit: Payer: Medicare Other | Admitting: Family

## 2016-01-07 DIAGNOSIS — H3581 Retinal edema: Secondary | ICD-10-CM | POA: Diagnosis not present

## 2016-01-07 DIAGNOSIS — H30033 Focal chorioretinal inflammation, peripheral, bilateral: Secondary | ICD-10-CM | POA: Diagnosis not present

## 2016-01-07 DIAGNOSIS — R51 Headache: Secondary | ICD-10-CM | POA: Diagnosis not present

## 2016-01-07 DIAGNOSIS — Z79899 Other long term (current) drug therapy: Secondary | ICD-10-CM | POA: Diagnosis not present

## 2016-01-07 DIAGNOSIS — H02834 Dermatochalasis of left upper eyelid: Secondary | ICD-10-CM | POA: Diagnosis not present

## 2016-01-07 DIAGNOSIS — Z961 Presence of intraocular lens: Secondary | ICD-10-CM | POA: Diagnosis not present

## 2016-01-07 DIAGNOSIS — H538 Other visual disturbances: Secondary | ICD-10-CM | POA: Diagnosis not present

## 2016-01-07 DIAGNOSIS — H02831 Dermatochalasis of right upper eyelid: Secondary | ICD-10-CM | POA: Diagnosis not present

## 2016-01-07 DIAGNOSIS — H402234 Chronic angle-closure glaucoma, bilateral, indeterminate stage: Secondary | ICD-10-CM | POA: Diagnosis not present

## 2016-01-21 DIAGNOSIS — I609 Nontraumatic subarachnoid hemorrhage, unspecified: Secondary | ICD-10-CM | POA: Diagnosis not present

## 2016-01-21 DIAGNOSIS — M542 Cervicalgia: Secondary | ICD-10-CM | POA: Diagnosis not present

## 2016-01-21 DIAGNOSIS — M2560 Stiffness of unspecified joint, not elsewhere classified: Secondary | ICD-10-CM | POA: Diagnosis not present

## 2016-01-26 DIAGNOSIS — M2560 Stiffness of unspecified joint, not elsewhere classified: Secondary | ICD-10-CM | POA: Diagnosis not present

## 2016-01-26 DIAGNOSIS — I609 Nontraumatic subarachnoid hemorrhage, unspecified: Secondary | ICD-10-CM | POA: Diagnosis not present

## 2016-01-26 DIAGNOSIS — M542 Cervicalgia: Secondary | ICD-10-CM | POA: Diagnosis not present

## 2016-01-28 DIAGNOSIS — M542 Cervicalgia: Secondary | ICD-10-CM | POA: Diagnosis not present

## 2016-01-28 DIAGNOSIS — M2560 Stiffness of unspecified joint, not elsewhere classified: Secondary | ICD-10-CM | POA: Diagnosis not present

## 2016-01-28 DIAGNOSIS — I609 Nontraumatic subarachnoid hemorrhage, unspecified: Secondary | ICD-10-CM | POA: Diagnosis not present

## 2016-02-02 DIAGNOSIS — H02834 Dermatochalasis of left upper eyelid: Secondary | ICD-10-CM | POA: Diagnosis not present

## 2016-02-02 DIAGNOSIS — H402234 Chronic angle-closure glaucoma, bilateral, indeterminate stage: Secondary | ICD-10-CM | POA: Diagnosis not present

## 2016-02-02 DIAGNOSIS — H30033 Focal chorioretinal inflammation, peripheral, bilateral: Secondary | ICD-10-CM | POA: Diagnosis not present

## 2016-02-02 DIAGNOSIS — H02831 Dermatochalasis of right upper eyelid: Secondary | ICD-10-CM | POA: Diagnosis not present

## 2016-02-02 DIAGNOSIS — Z961 Presence of intraocular lens: Secondary | ICD-10-CM | POA: Diagnosis not present

## 2016-02-02 DIAGNOSIS — Z79899 Other long term (current) drug therapy: Secondary | ICD-10-CM | POA: Diagnosis not present

## 2016-02-02 DIAGNOSIS — H3581 Retinal edema: Secondary | ICD-10-CM | POA: Diagnosis not present

## 2016-02-02 DIAGNOSIS — Z87891 Personal history of nicotine dependence: Secondary | ICD-10-CM | POA: Diagnosis not present

## 2016-02-04 DIAGNOSIS — M2560 Stiffness of unspecified joint, not elsewhere classified: Secondary | ICD-10-CM | POA: Diagnosis not present

## 2016-02-04 DIAGNOSIS — I609 Nontraumatic subarachnoid hemorrhage, unspecified: Secondary | ICD-10-CM | POA: Diagnosis not present

## 2016-02-04 DIAGNOSIS — M542 Cervicalgia: Secondary | ICD-10-CM | POA: Diagnosis not present

## 2016-02-06 DIAGNOSIS — M2560 Stiffness of unspecified joint, not elsewhere classified: Secondary | ICD-10-CM | POA: Diagnosis not present

## 2016-02-06 DIAGNOSIS — M542 Cervicalgia: Secondary | ICD-10-CM | POA: Diagnosis not present

## 2016-02-06 DIAGNOSIS — I609 Nontraumatic subarachnoid hemorrhage, unspecified: Secondary | ICD-10-CM | POA: Diagnosis not present

## 2016-02-11 DIAGNOSIS — I609 Nontraumatic subarachnoid hemorrhage, unspecified: Secondary | ICD-10-CM | POA: Diagnosis not present

## 2016-02-11 DIAGNOSIS — M542 Cervicalgia: Secondary | ICD-10-CM | POA: Diagnosis not present

## 2016-02-11 DIAGNOSIS — M2560 Stiffness of unspecified joint, not elsewhere classified: Secondary | ICD-10-CM | POA: Diagnosis not present

## 2016-02-12 DIAGNOSIS — I609 Nontraumatic subarachnoid hemorrhage, unspecified: Secondary | ICD-10-CM | POA: Diagnosis not present

## 2016-02-12 DIAGNOSIS — R51 Headache: Secondary | ICD-10-CM | POA: Diagnosis not present

## 2016-02-12 DIAGNOSIS — I6200 Nontraumatic subdural hemorrhage, unspecified: Secondary | ICD-10-CM | POA: Diagnosis not present

## 2016-02-12 DIAGNOSIS — M542 Cervicalgia: Secondary | ICD-10-CM | POA: Diagnosis not present

## 2016-02-13 DIAGNOSIS — M2560 Stiffness of unspecified joint, not elsewhere classified: Secondary | ICD-10-CM | POA: Diagnosis not present

## 2016-02-13 DIAGNOSIS — I609 Nontraumatic subarachnoid hemorrhage, unspecified: Secondary | ICD-10-CM | POA: Diagnosis not present

## 2016-02-13 DIAGNOSIS — M542 Cervicalgia: Secondary | ICD-10-CM | POA: Diagnosis not present

## 2016-02-18 DIAGNOSIS — M542 Cervicalgia: Secondary | ICD-10-CM | POA: Diagnosis not present

## 2016-02-18 DIAGNOSIS — I609 Nontraumatic subarachnoid hemorrhage, unspecified: Secondary | ICD-10-CM | POA: Diagnosis not present

## 2016-02-18 DIAGNOSIS — M2560 Stiffness of unspecified joint, not elsewhere classified: Secondary | ICD-10-CM | POA: Diagnosis not present

## 2016-02-20 DIAGNOSIS — M2560 Stiffness of unspecified joint, not elsewhere classified: Secondary | ICD-10-CM | POA: Diagnosis not present

## 2016-02-20 DIAGNOSIS — M542 Cervicalgia: Secondary | ICD-10-CM | POA: Diagnosis not present

## 2016-02-20 DIAGNOSIS — I609 Nontraumatic subarachnoid hemorrhage, unspecified: Secondary | ICD-10-CM | POA: Diagnosis not present

## 2016-02-26 DIAGNOSIS — I609 Nontraumatic subarachnoid hemorrhage, unspecified: Secondary | ICD-10-CM | POA: Diagnosis not present

## 2016-02-26 DIAGNOSIS — M542 Cervicalgia: Secondary | ICD-10-CM | POA: Diagnosis not present

## 2016-02-26 DIAGNOSIS — M2560 Stiffness of unspecified joint, not elsewhere classified: Secondary | ICD-10-CM | POA: Diagnosis not present

## 2016-03-01 DIAGNOSIS — H353130 Nonexudative age-related macular degeneration, bilateral, stage unspecified: Secondary | ICD-10-CM | POA: Diagnosis not present

## 2016-03-01 DIAGNOSIS — H02831 Dermatochalasis of right upper eyelid: Secondary | ICD-10-CM | POA: Diagnosis not present

## 2016-03-01 DIAGNOSIS — H402230 Chronic angle-closure glaucoma, bilateral, stage unspecified: Secondary | ICD-10-CM | POA: Diagnosis not present

## 2016-03-01 DIAGNOSIS — Z87891 Personal history of nicotine dependence: Secondary | ICD-10-CM | POA: Diagnosis not present

## 2016-03-01 DIAGNOSIS — Z947 Corneal transplant status: Secondary | ICD-10-CM | POA: Diagnosis not present

## 2016-03-01 DIAGNOSIS — H02834 Dermatochalasis of left upper eyelid: Secondary | ICD-10-CM | POA: Diagnosis not present

## 2016-03-01 DIAGNOSIS — E7800 Pure hypercholesterolemia, unspecified: Secondary | ICD-10-CM | POA: Diagnosis not present

## 2016-03-01 DIAGNOSIS — H3093 Unspecified chorioretinal inflammation, bilateral: Secondary | ICD-10-CM | POA: Diagnosis not present

## 2016-03-01 DIAGNOSIS — H26492 Other secondary cataract, left eye: Secondary | ICD-10-CM | POA: Diagnosis not present

## 2016-03-01 DIAGNOSIS — H17822 Peripheral opacity of cornea, left eye: Secondary | ICD-10-CM | POA: Diagnosis not present

## 2016-03-01 DIAGNOSIS — H402234 Chronic angle-closure glaucoma, bilateral, indeterminate stage: Secondary | ICD-10-CM | POA: Diagnosis not present

## 2016-03-01 DIAGNOSIS — I10 Essential (primary) hypertension: Secondary | ICD-10-CM | POA: Diagnosis not present

## 2016-03-01 DIAGNOSIS — H353131 Nonexudative age-related macular degeneration, bilateral, early dry stage: Secondary | ICD-10-CM | POA: Diagnosis not present

## 2016-03-01 DIAGNOSIS — H26491 Other secondary cataract, right eye: Secondary | ICD-10-CM | POA: Diagnosis not present

## 2016-03-01 DIAGNOSIS — Z961 Presence of intraocular lens: Secondary | ICD-10-CM | POA: Diagnosis not present

## 2016-03-10 DIAGNOSIS — I609 Nontraumatic subarachnoid hemorrhage, unspecified: Secondary | ICD-10-CM | POA: Diagnosis not present

## 2016-03-10 DIAGNOSIS — M542 Cervicalgia: Secondary | ICD-10-CM | POA: Diagnosis not present

## 2016-03-10 DIAGNOSIS — M2560 Stiffness of unspecified joint, not elsewhere classified: Secondary | ICD-10-CM | POA: Diagnosis not present

## 2016-03-17 DIAGNOSIS — M542 Cervicalgia: Secondary | ICD-10-CM | POA: Diagnosis not present

## 2016-03-17 DIAGNOSIS — M2560 Stiffness of unspecified joint, not elsewhere classified: Secondary | ICD-10-CM | POA: Diagnosis not present

## 2016-03-17 DIAGNOSIS — I609 Nontraumatic subarachnoid hemorrhage, unspecified: Secondary | ICD-10-CM | POA: Diagnosis not present

## 2016-03-18 DIAGNOSIS — Z961 Presence of intraocular lens: Secondary | ICD-10-CM | POA: Diagnosis not present

## 2016-03-18 DIAGNOSIS — H04123 Dry eye syndrome of bilateral lacrimal glands: Secondary | ICD-10-CM | POA: Diagnosis not present

## 2016-03-18 DIAGNOSIS — H02051 Trichiasis without entropian right upper eyelid: Secondary | ICD-10-CM | POA: Diagnosis not present

## 2016-03-18 DIAGNOSIS — H409 Unspecified glaucoma: Secondary | ICD-10-CM | POA: Diagnosis not present

## 2016-03-18 DIAGNOSIS — Z947 Corneal transplant status: Secondary | ICD-10-CM | POA: Diagnosis not present

## 2016-03-19 DIAGNOSIS — M542 Cervicalgia: Secondary | ICD-10-CM | POA: Diagnosis not present

## 2016-03-19 DIAGNOSIS — I609 Nontraumatic subarachnoid hemorrhage, unspecified: Secondary | ICD-10-CM | POA: Diagnosis not present

## 2016-03-19 DIAGNOSIS — M2560 Stiffness of unspecified joint, not elsewhere classified: Secondary | ICD-10-CM | POA: Diagnosis not present

## 2016-03-23 ENCOUNTER — Encounter: Payer: Self-pay | Admitting: Family

## 2016-03-23 ENCOUNTER — Ambulatory Visit (INDEPENDENT_AMBULATORY_CARE_PROVIDER_SITE_OTHER): Payer: Medicare Other | Admitting: Family

## 2016-03-23 ENCOUNTER — Telehealth: Payer: Self-pay | Admitting: Family

## 2016-03-23 VITALS — BP 127/63 | HR 76 | Temp 98.8°F | Resp 16 | Ht 65.0 in | Wt 149.0 lb

## 2016-03-23 DIAGNOSIS — R768 Other specified abnormal immunological findings in serum: Secondary | ICD-10-CM | POA: Diagnosis not present

## 2016-03-23 DIAGNOSIS — Z8679 Personal history of other diseases of the circulatory system: Secondary | ICD-10-CM

## 2016-03-23 DIAGNOSIS — E785 Hyperlipidemia, unspecified: Secondary | ICD-10-CM

## 2016-03-23 DIAGNOSIS — I10 Essential (primary) hypertension: Secondary | ICD-10-CM

## 2016-03-23 DIAGNOSIS — H409 Unspecified glaucoma: Secondary | ICD-10-CM | POA: Diagnosis not present

## 2016-03-23 DIAGNOSIS — G8929 Other chronic pain: Secondary | ICD-10-CM

## 2016-03-23 DIAGNOSIS — R51 Headache: Principal | ICD-10-CM

## 2016-03-23 LAB — SEDIMENTATION RATE: SED RATE: 17 mm/h (ref 0–30)

## 2016-03-23 LAB — BASIC METABOLIC PANEL
BUN: 14 mg/dL (ref 6–23)
CALCIUM: 9.4 mg/dL (ref 8.4–10.5)
CO2: 28 mEq/L (ref 19–32)
Chloride: 107 mEq/L (ref 96–112)
Creatinine, Ser: 0.88 mg/dL (ref 0.40–1.20)
GFR: 78.15 mL/min (ref >60.00)
GLUCOSE: 98 mg/dL (ref 70–99)
POTASSIUM: 4.1 meq/L (ref 3.5–5.1)
Sodium: 142 mEq/L (ref 135–145)

## 2016-03-23 NOTE — Assessment & Plan Note (Signed)
Management per opthalmology.

## 2016-03-23 NOTE — Patient Instructions (Addendum)
Please complete lab work prior to leaving.   

## 2016-03-23 NOTE — Assessment & Plan Note (Signed)
Clinically stable. Neurosurgery has signed off. Monitor.

## 2016-03-23 NOTE — Progress Notes (Signed)
Pre visit review using our clinic review tool, if applicable. No additional management support is needed unless otherwise documented below in the visit note. 

## 2016-03-23 NOTE — Telephone Encounter (Signed)
Referral has been placed. 

## 2016-03-23 NOTE — Telephone Encounter (Signed)
Notified pt. 

## 2016-03-23 NOTE — Assessment & Plan Note (Signed)
BP is stable on current meds. Continue same.  Obtain bmet.

## 2016-03-23 NOTE — Assessment & Plan Note (Signed)
At goal, tolerating statin, continue same.  

## 2016-03-23 NOTE — Telephone Encounter (Signed)
Patient was seen 03/23/16 and it was suggested that she go see a neurologist for her headaches. However, patient declined. Patient called back stating that she shouldn't have declined and she would like to get the referral and appointment to see a neurologist. Please advise.   Patient phone: [phone]

## 2016-03-23 NOTE — Progress Notes (Signed)
Subjective:    Patient ID: Mary Trevino, female    DOB: 05/18/1926, 82 y.o.   MRN: [id number]  HPI  Mary Trevino is an 82 yr old female who presents today for follow up.    pmhx is significant for the following:  1) HTN- maintained on furosemide, metoprolol. She denies CP/SOB. She reports some LE swelling which is at baseline.  BP Readings from Last 3 Encounters:  03/23/16 127/63  12/23/15 114/61  11/12/15 122/62    2) Hyperlipidemia- maintained on lipitor 10mg . Denies myalgias.  Lab Results  Component Value Date   CHOL 166 12/23/2015   HDL 64.70 12/23/2015   LDLCALC 83 12/23/2015   TRIG 94.0 12/23/2015   CHOLHDL 3 12/23/2015    3) Advanced Glaucoma/legally blind. She is followed by opthalmology. She is followed by Mary Trevino at [facility] for her glaucoma (03/18/16 office note is reviewed) and also followed by Retina specialist- Mary Trevino.  Per their note, she apparently has a history of a positive ANA and has been treated with methotrexate in the past.  Notes progressive deterioration in her visit.  She reports that she never saw rheumatology. She denies joint pain.  She thinks that she was given methotrexate for her hx of iritis.    4) SAH 12/18/15- follow up CT 9/9 noted trace right sided subarachnoid hemorrhage.  She reports that she followed up with Mary Trevino who "signed off" and referred her to PT.  She reports that she continues to have intermittent headaches.   Headaches- happens daily.  Intermittent.  Reports that she takes tylenol prn.  She describes headaches as aching in nature. Denies associated nausea.  She denies phonophobia or photophobia.  Reports some improvement with prn tylenol.    Review of Systems See HPI  Past Medical History:  Diagnosis Date  . Glaucoma   . Hyperlipidemia   . Hypertension      Social History   Social History  . Marital status: Widowed    Spouse name: N/A  . Number of children: N/A  . Years of education: N/A   Occupational History   . Not on file.   Social History Main Topics  . Smoking status: Former Smoker    Years: 10.00  . Smokeless tobacco: Never Used  . Alcohol use No  . Drug use: No  . Sexual activity: Not on file   Other Topics Concern  . 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 ABDOMINAL HYSTERECTOMY  1975  . CORNEAL TRANSPLANT     2016 (left) 2014 (right)     Family History  Problem Relation Age of Onset  . Hypertension Mother     died of cardiac arrest age 22  . Colon cancer Daughter     died age 40    No Known Allergies  Current Outpatient Prescriptions on File Prior to Visit  Medication Sig Dispense Refill  . acetaminophen (TYLENOL) 325 MG tablet Take 650 mg by mouth every 4 (four) hours as needed.    [name] atorvastatin (LIPITOR) 10 MG tablet Take 10 mg by mouth daily.    . bimatoprost (LUMIGAN) 0.01 % SOLN Place 1 drop into the right eye  nightly    . brimonidine-timolol (COMBIGAN) 0.2-0.5 % ophthalmic solution Place 1 drop into both eyes twice a day    . dorzolamide (TRUSOPT) 2 % ophthalmic solution Place 1 drop into the right eye daily    . furosemide (LASIX) 20 MG tablet Take 1 tablet (20 mg total) by mouth daily as needed for edema. 30 tablet 1  . gabapentin (NEURONTIN) 300 MG capsule Take 300 mg by mouth at bedtime.    [name] loteprednol (LOTEMAX) 0.5 % ophthalmic suspension     . metoprolol succinate (TOPROL XL) 25 MG 24 hr tablet Take 1 tablet (25 mg total) by mouth daily. 90 tablet 3  . prednisoLONE acetate (PRED FORTE) 1 % ophthalmic suspension Place 1 drop into the left eye twice a day    . traMADol (ULTRAM) 50 MG tablet Take 1 tablet (50 mg total) by mouth every 8 (eight) hours as needed. 30 tablet 0   No current  facility-administered medications on file prior to visit.     BP 127/63 (BP Location: Right Arm, Cuff Size: Normal)   Pulse 76   Temp 98.8 F (37.1 C) (Oral)   Resp 16   Ht 5\' 5"  (1.651 m)   Wt 149 lb (67.6 kg)   SpO2 100%   BMI 24.79 kg/m       Objective:   Physical Exam  Constitutional: She is oriented to person, place, and time. She appears well-developed and well-nourished.  HENT:  Head: Normocephalic and atraumatic.  Cardiovascular: Normal rate, regular rhythm and normal heart sounds.   No murmur heard. Pulmonary/Chest: Effort normal and breath sounds normal. No respiratory distress. She has no wheezes.  Musculoskeletal:  1+ bilateral LE edema  Neurological: She is alert and oriented to person, place, and time.  Psychiatric: She has a normal Trevino and affect. Her behavior is normal. Judgment and thought content normal.          Assessment & Plan:  Hx positive ANA- will obtain autoimmune studies.  If abnormal consider referral to rheumatology- discussed with patient.    Headaches- discussed possibility of referral to neurology- but she declines at this time.  Advised pt to continue prn tylenol.

## 2016-03-24 DIAGNOSIS — I609 Nontraumatic subarachnoid hemorrhage, unspecified: Secondary | ICD-10-CM | POA: Diagnosis not present

## 2016-03-24 DIAGNOSIS — M542 Cervicalgia: Secondary | ICD-10-CM | POA: Diagnosis not present

## 2016-03-24 DIAGNOSIS — M2560 Stiffness of unspecified joint, not elsewhere classified: Secondary | ICD-10-CM | POA: Diagnosis not present

## 2016-03-24 LAB — ANTI-NUCLEAR AB-TITER (ANA TITER): ANA Titer 1: 1:40 {titer} — ABNORMAL HIGH

## 2016-03-24 LAB — ANA: Anti Nuclear Antibody(ANA): POSITIVE — AB

## 2016-03-24 LAB — RHEUMATOID FACTOR

## 2016-03-25 ENCOUNTER — Telehealth: Payer: Self-pay | Admitting: Family

## 2016-03-25 DIAGNOSIS — R768 Other specified abnormal immunological findings in serum: Secondary | ICD-10-CM

## 2016-03-25 NOTE — Telephone Encounter (Signed)
Lupus screen is elevated. I would like her to see rheumatology

## 2016-03-26 DIAGNOSIS — M2560 Stiffness of unspecified joint, not elsewhere classified: Secondary | ICD-10-CM | POA: Diagnosis not present

## 2016-03-26 DIAGNOSIS — I609 Nontraumatic subarachnoid hemorrhage, unspecified: Secondary | ICD-10-CM | POA: Diagnosis not present

## 2016-03-26 DIAGNOSIS — M542 Cervicalgia: Secondary | ICD-10-CM | POA: Diagnosis not present

## 2016-03-26 NOTE — Telephone Encounter (Signed)
Notified pt and she is agreeable to proceed with referral. Referral placed. 

## 2016-03-29 DIAGNOSIS — Z79899 Other long term (current) drug therapy: Secondary | ICD-10-CM | POA: Diagnosis not present

## 2016-03-29 DIAGNOSIS — Z9889 Other specified postprocedural states: Secondary | ICD-10-CM | POA: Diagnosis not present

## 2016-03-29 DIAGNOSIS — Z961 Presence of intraocular lens: Secondary | ICD-10-CM | POA: Diagnosis not present

## 2016-03-29 DIAGNOSIS — I10 Essential (primary) hypertension: Secondary | ICD-10-CM | POA: Diagnosis not present

## 2016-03-29 DIAGNOSIS — H02834 Dermatochalasis of left upper eyelid: Secondary | ICD-10-CM | POA: Diagnosis not present

## 2016-03-29 DIAGNOSIS — Z87891 Personal history of nicotine dependence: Secondary | ICD-10-CM | POA: Diagnosis not present

## 2016-03-29 DIAGNOSIS — H353130 Nonexudative age-related macular degeneration, bilateral, stage unspecified: Secondary | ICD-10-CM | POA: Diagnosis not present

## 2016-03-29 DIAGNOSIS — H30033 Focal chorioretinal inflammation, peripheral, bilateral: Secondary | ICD-10-CM | POA: Diagnosis not present

## 2016-03-29 DIAGNOSIS — H179 Unspecified corneal scar and opacity: Secondary | ICD-10-CM | POA: Diagnosis not present

## 2016-03-29 DIAGNOSIS — E7800 Pure hypercholesterolemia, unspecified: Secondary | ICD-10-CM | POA: Diagnosis not present

## 2016-03-29 DIAGNOSIS — H26493 Other secondary cataract, bilateral: Secondary | ICD-10-CM | POA: Diagnosis not present

## 2016-03-29 DIAGNOSIS — H402234 Chronic angle-closure glaucoma, bilateral, indeterminate stage: Secondary | ICD-10-CM | POA: Diagnosis not present

## 2016-03-29 DIAGNOSIS — H2640 Unspecified secondary cataract: Secondary | ICD-10-CM | POA: Diagnosis not present

## 2016-03-29 DIAGNOSIS — H02831 Dermatochalasis of right upper eyelid: Secondary | ICD-10-CM | POA: Diagnosis not present

## 2016-03-29 DIAGNOSIS — H17822 Peripheral opacity of cornea, left eye: Secondary | ICD-10-CM | POA: Diagnosis not present

## 2016-04-01 ENCOUNTER — Ambulatory Visit (INDEPENDENT_AMBULATORY_CARE_PROVIDER_SITE_OTHER): Payer: Medicare Other | Admitting: Neurology

## 2016-04-01 ENCOUNTER — Encounter: Payer: Self-pay | Admitting: Neurology

## 2016-04-01 VITALS — BP 105/63 | HR 73 | Ht 65.0 in | Wt 149.0 lb

## 2016-04-01 DIAGNOSIS — R51 Headache: Secondary | ICD-10-CM | POA: Diagnosis not present

## 2016-04-01 DIAGNOSIS — R519 Headache, unspecified: Secondary | ICD-10-CM | POA: Insufficient documentation

## 2016-04-01 DIAGNOSIS — Z8679 Personal history of other diseases of the circulatory system: Secondary | ICD-10-CM

## 2016-04-01 DIAGNOSIS — M5416 Radiculopathy, lumbar region: Secondary | ICD-10-CM | POA: Diagnosis not present

## 2016-04-01 MED ORDER — NORTRIPTYLINE HCL 10 MG PO CAPS: 20.0000 mg | ORAL_CAPSULE | Freq: Every day | ORAL | 11 refills | Status: DC

## 2016-04-01 NOTE — Progress Notes (Signed)
PATIENT: Mary Trevino DOB: November 17, 1926  Chief Complaint  Patient presents with  . Headache    Reports having a head injury in September (fell out of bed and hit head on floor) that caused a subarachnoid hemorrhage.  Since this event, she has daily headaches (worse at night) on the left side of her head.  [name] PCP    Debbrah Alar, NP     HISTORICAL  Mary Trevino is 82 years old right-handed female, seen in refer by her primary care nurse practitioner Debbrah Alar for follow-up of intracranial bleeding in September 2017, initial evaluation was April 01 2016.  She was brought in by her aid, but alone during interview. Lives with her daughter's family, she had past medical history of hyperlipidemia, hypertension, glaucoma, legally blind in her right eye, history of bilateral corneal transplant, right side was in 2014, left side was in 2016. She can only see through her left eye, she use of magnified glasses to read through her left eye.  At baseline, she watches TV, in past few days, she complains of few weeks history right side low back pain, radiating pain to right leg, calf, right foot numbness, pain limited her walking, she did not have radiating pain to left leg, she has no bowel and bladder incontinence.   She fell out of the bed while dreaming, on December 20 2015, hit her right head against the wall, she developed severe headaches at right side, across the back, difficulty turning her neck, she was treated at [facility], I reviewed CT head report on January 01 2016, and December 20 2015, traumatic subarachnoid hemorrhage along the right cerebral convexity, no acute infarction, generalized cerebral volume loss, atherosclerotic calcification,  Now she still has right parietal area headaches, at night, it go up to 10/10, pressure, throbbing, no light noise sensitivity, no nause, difficulty sleeping.  She has nocturia, 4 times each night.   REVIEW OF SYSTEMS:  Full 14 system review of systems performed and notable only for not enough sleep, joint pain, achy muscles, headaches  ALLERGIES: No Known Allergies  HOME MEDICATIONS: Current Outpatient Prescriptions  Medication Sig Dispense Refill  . acetaminophen (TYLENOL) 325 MG tablet Take 650 mg by mouth every 4 (four) hours as needed.    [name] atorvastatin (LIPITOR) 10 MG tablet Take 10 mg by mouth daily.    . bimatoprost (LUMIGAN) 0.01 % SOLN Place 1 drop into the right eye nightly    . brimonidine-timolol (COMBIGAN) 0.2-0.5 % ophthalmic solution Place 1 drop into both eyes twice a day    . dorzolamide (TRUSOPT) 2 % ophthalmic solution Place 1 drop into the right eye daily    . folic acid (FOLVITE) 1 MG tablet Take 1 mg by mouth.    . furosemide (LASIX) 20 MG tablet Take 1 tablet (20 mg total) by mouth daily as needed for edema. 30 tablet 1  . gabapentin (NEURONTIN) 300 MG capsule Take 300 mg by mouth at bedtime.    [name] loteprednol (LOTEMAX) 0.5 % ophthalmic suspension Place 1 drop into the right eye.     . metoprolol succinate (TOPROL XL) 25 MG 24 hr tablet Take 1 tablet (25 mg total) by mouth daily. 90 tablet 3  . moxifloxacin (VIGAMOX) 0.5 % ophthalmic solution INT 1 GTT IN OS QID FOR 3 DAYS  3  . prednisoLONE acetate (PRED FORTE) 1 % ophthalmic suspension Place 1 drop into the left eye twice a day     No current  facility-administered medications for this visit.     PAST MEDICAL HISTORY: Past Medical History:  Diagnosis Date  . Glaucoma   . Headache   . Hyperlipidemia   . Hypertension   . Subarachnoid hemorrhage ([place]) 12/2015    PAST SURGICAL HISTORY: Past Surgical History:  Procedure Laterality Date  . ABDOMINAL HYSTERECTOMY  1975  . CORNEAL TRANSPLANT     2016 (left) 2014 (right)     FAMILY HISTORY: Family History  Problem Relation Age of Onset  . Hypertension Mother     died of cardiac arrest age 7  . Heart attack Mother   . Other Father     natural causes  . Colon cancer  Daughter     died age 44    SOCIAL HISTORY:  Social History   Social History  . Marital status: Widowed    Spouse name: N/A  . Number of children: 3  . Years of education: HS   Occupational History  . Retied    Social History Main Topics  . Smoking status: Former Smoker    Years: 10.00  . Smokeless tobacco: Never Used  . Alcohol use No  . Drug use: No  . Sexual activity: Not on file   Other Topics Concern  . 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   No caffeine use.     PHYSICAL EXAM   Vitals:   04/01/16 0906  BP: 105/63  Pulse: 73  Weight: 149 lb (67.6 kg)  Height: 5\' 5"  (1.651 m)    Not recorded      Body mass index is 24.79 kg/m.  PHYSICAL EXAMNIATION:  Gen: NAD, conversant, well nourised, obese, well groomed                     Cardiovascular: Regular rate rhythm, no peripheral edema, warm, nontender. Eyes: Conjunctivae clear without exudates or hemorrhage Neck: Supple, no carotid bruits. Pulmonary: Clear to auscultation bilaterally   NEUROLOGICAL EXAM:  MENTAL STATUS: Speech:    Speech is normal; fluent and spontaneous with normal comprehension.  Cognition:     Orientation to time, place and person     Normal recent and remote memory     Normal Attention span and concentration     Normal Language, naming, repeating,spontaneous speech     Fund of knowledge   CRANIAL NERVES: CN II: Visual fields are full to confrontation. Fundoscopic exam is normal with sharp discs and no vascular changes. Status post bilateral corneal transplant, CN III, IV, VI: extraocular movement are normal. No ptosis. CN V: Facial sensation is intact to pinprick in all 3 divisions bilaterally. Corneal responses are intact.  CN VII: Face is symmetric  with normal eye closure and smile. CN VIII: Hearing is normal to rubbing fingers CN IX, X: Palate elevates symmetrically. Phonation is normal. CN XI: Head turning and shoulder shrug are intact CN XII: Tongue is midline with normal movements and no atrophy.  MOTOR: There is no pronator drift of out-stretched arms. Muscle bulk and tone are normal. Muscle strength is normal.  REFLEXES: Reflexes are 2+ and symmetric at the biceps, triceps, knees, and ankles. Plantar responses are flexor.  SENSORY: Intact to light touch, pinprick, positional sensation  and vibratory sensation are intact in fingers and toes.  COORDINATION: Rapid alternating movements and fine finger movements are intact. There is no dysmetria on finger-to-nose and heel-knee-shin.    GAIT/STANCE: Posture is normal. Gait is steady with cautious, Romberg is absent.   DIAGNOSTIC DATA (LABS, IMAGING, TESTING) - I reviewed patient records, labs, notes, testing and imaging myself where available.   ASSESSMENT AND PLAN  Mary Trevino is a 82 y.o. female   Subarachnoid hemorrhage status post fall on December 20 2015 Continued right parietal area headaches  Repeat MRI of the brain  She is taking gabapentin 300 mg every night, add on nortriptyline 10 mg titrating to 20 mg every night New-onset right lumbar radiculopathy  MRI of lumbar   Marcial Pacas, M.D. Ph.D.  [facility] [address] [place], [place] [address] Ph: [phone] Fax: [phone]  CC: Debbrah Alar, NP

## 2016-04-08 DIAGNOSIS — Z87891 Personal history of nicotine dependence: Secondary | ICD-10-CM | POA: Diagnosis not present

## 2016-04-08 DIAGNOSIS — I10 Essential (primary) hypertension: Secondary | ICD-10-CM | POA: Diagnosis not present

## 2016-04-08 DIAGNOSIS — Z9841 Cataract extraction status, right eye: Secondary | ICD-10-CM | POA: Diagnosis not present

## 2016-04-08 DIAGNOSIS — Z79899 Other long term (current) drug therapy: Secondary | ICD-10-CM | POA: Diagnosis not present

## 2016-04-08 DIAGNOSIS — Z9842 Cataract extraction status, left eye: Secondary | ICD-10-CM | POA: Diagnosis not present

## 2016-04-08 DIAGNOSIS — Z961 Presence of intraocular lens: Secondary | ICD-10-CM | POA: Diagnosis not present

## 2016-04-08 DIAGNOSIS — H4020X4 Unspecified primary angle-closure glaucoma, indeterminate stage: Secondary | ICD-10-CM | POA: Diagnosis not present

## 2016-04-08 DIAGNOSIS — H17822 Peripheral opacity of cornea, left eye: Secondary | ICD-10-CM | POA: Diagnosis not present

## 2016-04-08 DIAGNOSIS — H3581 Retinal edema: Secondary | ICD-10-CM | POA: Diagnosis not present

## 2016-04-08 DIAGNOSIS — E7800 Pure hypercholesterolemia, unspecified: Secondary | ICD-10-CM | POA: Diagnosis not present

## 2016-04-08 DIAGNOSIS — H402234 Chronic angle-closure glaucoma, bilateral, indeterminate stage: Secondary | ICD-10-CM | POA: Diagnosis not present

## 2016-04-15 ENCOUNTER — Ambulatory Visit
Admission: RE | Admit: 2016-04-15 | Discharge: 2016-04-15 | Disposition: A | Discharge status: Home / Self Care | Payer: Medicare Other | Source: Ambulatory Visit | Attending: Neurology | Admitting: Neurology

## 2016-04-15 DIAGNOSIS — M5416 Radiculopathy, lumbar region: Secondary | ICD-10-CM

## 2016-04-15 DIAGNOSIS — Z8679 Personal history of other diseases of the circulatory system: Secondary | ICD-10-CM

## 2016-04-15 DIAGNOSIS — R51 Headache: Secondary | ICD-10-CM

## 2016-04-15 DIAGNOSIS — R519 Headache, unspecified: Secondary | ICD-10-CM

## 2016-04-19 ENCOUNTER — Telehealth: Payer: Self-pay | Admitting: Neurology

## 2016-04-19 NOTE — Telephone Encounter (Signed)
Pt request MRI results.  °

## 2016-04-19 NOTE — Telephone Encounter (Signed)
Please call patient MRI of the lumbar showed prominent spondylitic changes, most prominent at L4-5, asymmetric broad based disc osteophyte, bulging to the left side, with severe left-sided foraminal narrowing, likely compression of the left L4-5 nerve roots,  MRI of the brain showed evidence of previous hemorrhage, age-related changes, no acute abnormality   IMPRESSION:  Abnormal MRI scan of the lumbar spine showing prominent spondylitic changes throughout most prominent at L4-5 where there is asymmetric broad-based disc osteophyte bulge to the left resulting in severe left-sided foraminal narrowing and likely involvement of the left L4 and L5 nerve roots. Incidental bilateral simple renal cysts are noted  IMPRESSION:  Unremarkable MRI scan of the brain showing expected changes of remote age blood products over the right convexity from prior subarachnoid hemorrhage and mild changes of age-appropriate chronic microvascular ischemia.

## 2016-04-19 NOTE — Telephone Encounter (Signed)
Pt aware of results and requested an earlier appt.  She has been moved to an available appt on 04/22/16.

## 2016-04-22 ENCOUNTER — Encounter: Payer: Self-pay | Admitting: Neurology

## 2016-04-22 ENCOUNTER — Ambulatory Visit (INDEPENDENT_AMBULATORY_CARE_PROVIDER_SITE_OTHER): Payer: Medicare Other | Admitting: Neurology

## 2016-04-22 ENCOUNTER — Telehealth: Payer: Self-pay | Admitting: Neurology

## 2016-04-22 VITALS — BP 119/64 | HR 72 | Ht 65.0 in | Wt 148.8 lb

## 2016-04-22 DIAGNOSIS — Z8679 Personal history of other diseases of the circulatory system: Secondary | ICD-10-CM | POA: Diagnosis not present

## 2016-04-22 DIAGNOSIS — M5481 Occipital neuralgia: Secondary | ICD-10-CM | POA: Diagnosis not present

## 2016-04-22 DIAGNOSIS — M5416 Radiculopathy, lumbar region: Secondary | ICD-10-CM | POA: Diagnosis not present

## 2016-04-22 NOTE — Telephone Encounter (Signed)
Dr. Krista Blue had called the patient's daughter while her mother was here for an appt today and she was unable to reach her.  Dr. Krista Blue was able to proceed with treatment and take care of the patient today, without talking with the daughter.  She no longer needs to speak with the daughter unless she still has questions.  Left a message on the daughter's voicemail letting her know to disregard the message from Dr. Krista Blue earlier.  Provided our number to call with any further questions.

## 2016-04-22 NOTE — Telephone Encounter (Signed)
Patients daughter is returning a call in reference to MRI results.  Please call before 3:00pm due to work schedule.

## 2016-04-22 NOTE — Progress Notes (Signed)
PATIENT: Mary Trevino DOB: 06-03-1926  Chief Complaint  Patient presents with  . Hx of subarachnoid hemorrhage    She would like to review her MRI.  Feels gabapentin and nortriptyline have not been very helpful for her headaches.  . Back Pain    She would like to review her MRI.     HISTORICAL  Mary Trevino is 83 years old right-handed female, seen in refer by her primary care nurse practitioner Debbrah Alar for follow-up of intracranial bleeding in September 2017, initial evaluation was April 01 2016.  She was brought in by her aid, but alone during interview. Lives with her daughter's family, she had past medical history of hyperlipidemia, hypertension, glaucoma, legally blind in her right eye, history of bilateral corneal transplant, right side was in 2014, left side was in 2016. She can only see through her left eye, she use of magnified glasses to read through her left eye.  At baseline, she watches TV, in past few days, she complains of few weeks history right side low back pain, radiating pain to right leg, calf, right foot numbness, pain limited her walking, she did not have radiating pain to left leg, she has no bowel and bladder incontinence.   She fell out of the bed while dreaming, on December 20 2015, hit her right head against the wall, she developed severe headaches at right side, across the back, difficulty turning her neck, she was treated at [facility], I reviewed CT head report on January 01 2016, and December 20 2015, traumatic subarachnoid hemorrhage along the right cerebral convexity, no acute infarction, generalized cerebral volume loss, atherosclerotic calcification,  Now she still has right parietal area headaches, at night, it go up to 10/10, pressure, throbbing, no light noise sensitivity, no nause, difficulty sleeping.  She has nocturia, 4 times each night.   UPDATE Apr 22 2016: She still has significant headaches at right  occipital, parietal area, Especially when she lying down at nighttime trying to go to sleep, she is now taking gabapentin 300 mg at bedtime, nortriptyline 20 mg at nighttime with limited help,  We have personally reviewe MRI of lumbar, there was significant multilevel degenerative disc disease most prominent at L L4-5, L5-S1, with moderate to severe bilateral foraminal stenosis.    MRI of the brain in January 2018, generalized atrophy, evidence of chronic right subarachnoid hemorrhage, no acute abnormality   Today she was noted to have significant right occipital area pain upon deep palpation, I have performed trigger point injection   REVIEW OF SYSTEMS: Full 14 system review of systems performed and notable only for not enough sleep, joint pain, achy muscles, headaches  ALLERGIES: No Known Allergies  HOME MEDICATIONS: Current Outpatient Prescriptions  Medication Sig Dispense Refill  . acetaminophen (TYLENOL) 325 MG tablet Take 650 mg by mouth every 4 (four) hours as needed.    [name] atorvastatin (LIPITOR) 10 MG tablet Take 10 mg by mouth daily.    . bimatoprost (LUMIGAN) 0.01 % SOLN Place 1 drop into the right eye nightly    . brimonidine-timolol (COMBIGAN) 0.2-0.5 % ophthalmic solution Place 1 drop into both eyes twice a day    . dorzolamide (TRUSOPT) 2 % ophthalmic solution Place 1 drop into the right eye daily    . folic acid (FOLVITE) 1 MG tablet Take 1 mg by mouth.    . furosemide (LASIX) 20 MG tablet Take 1 tablet (20 mg total) by mouth daily as needed for edema.  30 tablet 1  . gabapentin (NEURONTIN) 300 MG capsule Take 300 mg by mouth at bedtime.    [name] loteprednol (LOTEMAX) 0.5 % ophthalmic suspension Place 1 drop into the right eye.     . metoprolol succinate (TOPROL XL) 25 MG 24 hr tablet Take 1 tablet (25 mg total) by mouth daily. 90 tablet 3  . moxifloxacin (VIGAMOX) 0.5 % ophthalmic solution INT 1 GTT IN OS QID FOR 3 DAYS  3  . nortriptyline (PAMELOR) 10 MG capsule Take 2 capsules  (20 mg total) by mouth at bedtime. 60 capsule 11  . prednisoLONE acetate (PRED FORTE) 1 % ophthalmic suspension Place 1 drop into the left eye twice a day     No current facility-administered medications for this visit.     PAST MEDICAL HISTORY: Past Medical History:  Diagnosis Date  . Glaucoma   . Headache   . Hyperlipidemia   . Hypertension   . Subarachnoid hemorrhage ([place]) 12/2015    PAST SURGICAL HISTORY: Past Surgical History:  Procedure Laterality Date  . ABDOMINAL HYSTERECTOMY  1975  . CORNEAL TRANSPLANT     2016 (left) 2014 (right)     FAMILY HISTORY: Family History  Problem Relation Age of Onset  . Hypertension Mother     died of cardiac arrest age 55  . Heart attack Mother   . Other Father     natural causes  . Colon cancer Daughter     died age 62    SOCIAL HISTORY:  Social History   Social History  . Marital status: Widowed    Spouse name: N/A  . Number of children: 3  . Years of education: HS   Occupational History  . [employer]ied    Social History Main Topics  . Smoking status: Former Smoker    Years: 10.00  . Smokeless tobacco: Never Used  . Alcohol use No  . Drug use: No  . Sexual activity: Not on file   Other Topics Concern  . 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   No caffeine use.     PHYSICAL EXAM   Vitals:   04/22/16 1101  BP: 119/64  Pulse: 72  Weight: 148 lb 12 oz (67.5 kg)  Height: 5\' 5"  (1.651 m)    Not recorded      Body mass index is 24.75 kg/m.  PHYSICAL EXAMNIATION:  Gen: NAD, conversant, well nourised, obese, well groomed                     Cardiovascular: Regular rate rhythm, no peripheral edema, warm, nontender. Eyes: Conjunctivae clear without exudates or hemorrhage Neck: Supple, no  carotid bruits. Pulmonary: Clear to auscultation bilaterally   NEUROLOGICAL EXAM:  MENTAL STATUS: Speech:    Speech is normal; fluent and spontaneous with normal comprehension.  Cognition:     Orientation to time, place and person     Normal recent and remote memory     Normal Attention span and concentration     Normal Language, naming, repeating,spontaneous speech     Fund of knowledge   CRANIAL NERVES: CN II: Visual fields are full to confrontation. Fundoscopic exam is normal with sharp discs and no vascular changes. Status  post bilateral corneal transplant, CN III, IV, VI: extraocular movement are normal. No ptosis. CN V: Facial sensation is intact to pinprick in all 3 divisions bilaterally. Corneal responses are intact.  CN VII: Face is symmetric with normal eye closure and smile. CN VIII: Hearing is normal to rubbing fingers CN IX, X: Palate elevates symmetrically. Phonation is normal. CN XI: Head turning and shoulder shrug are intact CN XII: Tongue is midline with normal movements and no atrophy.  MOTOR: There is no pronator drift of out-stretched arms. Muscle bulk and tone are normal. Muscle strength is normal.  REFLEXES: Reflexes are 2+ and symmetric at the biceps, triceps, knees, and ankles. Plantar responses are flexor.  SENSORY: Intact to light touch, pinprick, positional sensation and vibratory sensation are intact in fingers and toes.  COORDINATION: Rapid alternating movements and fine finger movements are intact. There is no dysmetria on finger-to-nose and heel-knee-shin.    GAIT/STANCE: Posture is normal. Gait is steady with cautious, Romberg is absent.   DIAGNOSTIC DATA (LABS, IMAGING, TESTING) - I reviewed patient records, labs, notes, testing and imaging myself where available.   ASSESSMENT AND PLAN  Kyrstle Kaufhold is a 83 y.o. female   Subarachnoid hemorrhage status post fall on December 20 2015 Continued right parietal area headaches, Suggestive of  right occipital neuralgia,   right occipital nerve block, and trigger point injection Used 0.5%, 5 mg of bupivacaine, plus 6 mg of betamethasone,   Injection was performed at right nuchal area, total four injection site, she tolerated the injection well, no significant side effect notice,  Also advised her hot compression, deep tissue massage, as needed NSAIDs, Chronic low back pain, bilateral lumbar radiculopathy,  MRI of the lumbar showed multilevel degenerative disc disease, moderate to severe foraminal stenosis at L4-5 L5-S1.  Marcial Pacas, M.D. Ph.D.  [facility] [address] [place], [place] [address] Ph: [phone] Fax: [phone]  CC: Debbrah Alar, NP

## 2016-05-03 DIAGNOSIS — H409 Unspecified glaucoma: Secondary | ICD-10-CM | POA: Diagnosis not present

## 2016-05-03 DIAGNOSIS — H40051 Ocular hypertension, right eye: Secondary | ICD-10-CM | POA: Diagnosis not present

## 2016-05-07 DIAGNOSIS — H402234 Chronic angle-closure glaucoma, bilateral, indeterminate stage: Secondary | ICD-10-CM | POA: Diagnosis not present

## 2016-05-07 DIAGNOSIS — H209 Unspecified iridocyclitis: Secondary | ICD-10-CM | POA: Diagnosis not present

## 2016-05-07 DIAGNOSIS — H17823 Peripheral opacity of cornea, bilateral: Secondary | ICD-10-CM | POA: Diagnosis not present

## 2016-05-14 ENCOUNTER — Telehealth: Payer: Self-pay | Admitting: *Deleted

## 2016-05-14 MED ORDER — ATORVASTATIN CALCIUM 10 MG PO TABS: 10.0000 mg | ORAL_TABLET | Freq: Every day | ORAL | 1 refills | Status: DC

## 2016-05-14 NOTE — Telephone Encounter (Signed)
Received fax from [facility] requesting rx for atorvastatin. Rx sent.

## 2016-05-24 ENCOUNTER — Ambulatory Visit: Payer: Medicare Other | Admitting: Neurology

## 2016-05-25 NOTE — Telephone Encounter (Signed)
None

## 2016-06-01 DIAGNOSIS — Z8249 Family history of ischemic heart disease and other diseases of the circulatory system: Secondary | ICD-10-CM | POA: Diagnosis not present

## 2016-06-01 DIAGNOSIS — H409 Unspecified glaucoma: Secondary | ICD-10-CM | POA: Diagnosis not present

## 2016-06-01 DIAGNOSIS — H4051X3 Glaucoma secondary to other eye disorders, right eye, severe stage: Secondary | ICD-10-CM | POA: Diagnosis not present

## 2016-06-01 DIAGNOSIS — M1990 Unspecified osteoarthritis, unspecified site: Secondary | ICD-10-CM | POA: Diagnosis not present

## 2016-06-01 DIAGNOSIS — I10 Essential (primary) hypertension: Secondary | ICD-10-CM | POA: Diagnosis not present

## 2016-06-01 DIAGNOSIS — Z79899 Other long term (current) drug therapy: Secondary | ICD-10-CM | POA: Diagnosis not present

## 2016-06-01 DIAGNOSIS — Z87891 Personal history of nicotine dependence: Secondary | ICD-10-CM | POA: Diagnosis not present

## 2016-06-03 DIAGNOSIS — H3581 Retinal edema: Secondary | ICD-10-CM | POA: Diagnosis not present

## 2016-06-03 DIAGNOSIS — H402234 Chronic angle-closure glaucoma, bilateral, indeterminate stage: Secondary | ICD-10-CM | POA: Diagnosis not present

## 2016-06-03 DIAGNOSIS — H17822 Peripheral opacity of cornea, left eye: Secondary | ICD-10-CM | POA: Diagnosis not present

## 2016-06-03 DIAGNOSIS — Z961 Presence of intraocular lens: Secondary | ICD-10-CM | POA: Diagnosis not present

## 2016-06-07 DIAGNOSIS — Z947 Corneal transplant status: Secondary | ICD-10-CM | POA: Diagnosis not present

## 2016-06-21 ENCOUNTER — Ambulatory Visit (INDEPENDENT_AMBULATORY_CARE_PROVIDER_SITE_OTHER): Payer: Medicare Other | Admitting: Family

## 2016-06-21 ENCOUNTER — Encounter: Payer: Self-pay | Admitting: Family

## 2016-06-21 VITALS — BP 115/65 | HR 75 | Temp 98.3°F | Resp 16 | Ht 65.0 in | Wt 154.2 lb

## 2016-06-21 DIAGNOSIS — F329 Major depressive disorder, single episode, unspecified: Secondary | ICD-10-CM | POA: Insufficient documentation

## 2016-06-21 DIAGNOSIS — R768 Other specified abnormal immunological findings in serum: Secondary | ICD-10-CM

## 2016-06-21 DIAGNOSIS — R5383 Other fatigue: Secondary | ICD-10-CM | POA: Diagnosis not present

## 2016-06-21 DIAGNOSIS — E785 Hyperlipidemia, unspecified: Secondary | ICD-10-CM

## 2016-06-21 DIAGNOSIS — F32A Depression, unspecified: Secondary | ICD-10-CM | POA: Insufficient documentation

## 2016-06-21 DIAGNOSIS — I10 Essential (primary) hypertension: Secondary | ICD-10-CM

## 2016-06-21 LAB — CBC WITH DIFFERENTIAL/PLATELET
BASOS ABS: 0 10*3/uL (ref 0.0–0.1)
Basophils Relative: 0.7 % (ref 0.0–3.0)
EOS ABS: 0.1 10*3/uL (ref 0.0–0.7)
Eosinophils Relative: 2.5 % (ref 0.0–5.0)
HEMATOCRIT: 37.3 % (ref 36.0–46.0)
Hemoglobin: 12.1 g/dL (ref 12.0–15.0)
LYMPHS ABS: 1 10*3/uL (ref 0.7–4.0)
LYMPHS PCT: 17.7 % (ref 12.0–46.0)
MCHC: 32.4 g/dL (ref 30.0–36.0)
MCV: 86.9 fl (ref 78.0–100.0)
Monocytes Absolute: 0.6 10*3/uL (ref 0.1–1.0)
Monocytes Relative: 9.9 % (ref 3.0–12.0)
NEUTROS ABS: 4 10*3/uL (ref 1.4–7.7)
Neutrophils Relative %: 69.2 % (ref 43.0–77.0)
PLATELETS: 194 10*3/uL (ref 150.0–400.0)
RBC: 4.29 Mil/uL (ref 3.87–5.11)
RDW: 15.1 % (ref 11.5–15.5)
WBC: 5.8 10*3/uL (ref 4.0–10.5)

## 2016-06-21 MED ORDER — TOLTERODINE TARTRATE 2 MG PO TABS: 2.0000 mg | ORAL_TABLET | Freq: Two times a day (BID) | ORAL | 3 refills | Status: DC

## 2016-06-21 NOTE — Progress Notes (Signed)
Pre visit review using our clinic review tool, if applicable. No additional management support is needed unless otherwise documented below in the visit note. 

## 2016-06-21 NOTE — Progress Notes (Signed)
Subjective:    Patient ID: Mary Trevino, female    DOB: [birth year], 83 y.o.   MRN: [id number]  HPI  Mary Trevino is an 83 yr old female who presents today for follow up.  1) HTN- maintained on lasix and toprol.  BP Readings from Last 3 Encounters:  06/21/16 115/65  04/22/16 119/64  04/01/16 105/63    2) Depression- reports that her Trevino is stable.   3) Hyperlipidemia- not on statin Lab Results  Component Value Date   CHOL 166 12/23/2015   HDL 64.70 12/23/2015   LDLCALC 83 12/23/2015   TRIG 94.0 12/23/2015   CHOLHDL 3 12/23/2015   4) ANA positive- reports + fatigue.  Occasional joint pain, back pain. Did not see rheumatology. Not sure what happened with her appointment.   She had recent laser surgery of the right eye due to increased pressure in the right eye.    She reports that she has urinary frequency during the day.  Notes that she gets up 4 times each night. Denies dysuria.  Does not drink much in the evenings.    Review of Systems See HPI  Past Medical History:  Diagnosis Date  . Glaucoma   . Headache   . Hyperlipidemia   . Hypertension   . Subarachnoid hemorrhage ([place]) 12/2015     Social History   Social History  . Marital status: Widowed    Spouse name: N/A  . Number of children: 3  . Years of education: HS   Occupational History  . Retied    Social History Main Topics  . Smoking status: Former Smoker    Years: 10.00  . Smokeless tobacco: Never Used  . Alcohol use No  . Drug use: No  . Sexual activity: Not on file   Other Topics Concern  . 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   No caffeine use.    Past Surgical History:  Procedure Laterality Date  . ABDOMINAL HYSTERECTOMY  1975  . CORNEAL TRANSPLANT     2016  (left) 2014 (right)     Family History  Problem Relation Age of Onset  . Hypertension Mother     died of cardiac arrest age 7  . Heart attack Mother   . Other Father     natural causes  . Colon cancer Daughter     died age 21    No Known Allergies  Current Outpatient Prescriptions on File Prior to Visit  Medication Sig Dispense Refill  . acetaminophen (TYLENOL) 325 MG tablet Take 650 mg by mouth every 4 (four) hours as needed.    [name] atorvastatin (LIPITOR) 10 MG tablet Take 1 tablet (10 mg total) by mouth daily. 90 tablet 1  . bimatoprost (LUMIGAN) 0.01 % SOLN Place 1 drop into the right eye nightly    . brimonidine-timolol (COMBIGAN) 0.2-0.5 % ophthalmic solution Place 1 drop into both eyes twice a day    . dorzolamide (TRUSOPT) 2 % ophthalmic solution Place 1 drop into the right eye daily    . furosemide (LASIX) 20 MG tablet Take 1 tablet (20 mg total) by mouth daily as needed for edema. 30 tablet 1  . gabapentin (NEURONTIN) 300 MG capsule Take 300  mg by mouth at bedtime.    [name] loteprednol (LOTEMAX) 0.5 % ophthalmic suspension Place 1 drop into the right eye.     . metoprolol succinate (TOPROL XL) 25 MG 24 hr tablet Take 1 tablet (25 mg total) by mouth daily. 90 tablet 3  . moxifloxacin (VIGAMOX) 0.5 % ophthalmic solution INT 1 GTT IN OS QID FOR 3 DAYS  3  . nortriptyline (PAMELOR) 10 MG capsule Take 2 capsules (20 mg total) by mouth at bedtime. 60 capsule 11  . prednisoLONE acetate (PRED FORTE) 1 % ophthalmic suspension Place 1 drop into the left eye twice a day     No current facility-administered medications on file prior to visit.     BP 115/65 (BP Location: Right Arm, Cuff Size: Large)   Pulse 75   Temp 98.3 F (36.8 C) (Oral)   Resp 16   Ht 5\' 5"  (1.651 m)   Wt 154 lb 3.2 oz (69.9 kg)   SpO2 99% Comment: room air  BMI 25.66 kg/m       Objective:   Physical Exam  Constitutional: She is oriented to person, place, and time. She appears well-developed and  well-nourished.  HENT:  Head: Normocephalic and atraumatic.  Cardiovascular: Normal rate, regular rhythm and normal heart sounds.   No murmur heard. Pulmonary/Chest: Effort normal and breath sounds normal. No respiratory distress. She has no wheezes.  Musculoskeletal: She exhibits no edema.  Lymphadenopathy:    She has no cervical adenopathy.  Neurological: She is alert and oriented to person, place, and time.  Psychiatric: She has a normal Trevino and affect. Her behavior is normal. Judgment and thought content normal.          Assessment & Plan:  Overactive bladder- unfortunately myrbetriq can cause prolonged QT with her pamelor.  detrol contraindicated with glaucoma.  If she comes off pamelor in the future, could consider myrbetriq.  ANA positive-  Will reinitiate her referral to rheumatology.  Fatigue- obtain CBC and TSH

## 2016-06-21 NOTE — Patient Instructions (Addendum)
  Complete lab work prior to leaving.  

## 2016-06-21 NOTE — Assessment & Plan Note (Signed)
Stable.  Monitor.  

## 2016-06-21 NOTE — Assessment & Plan Note (Signed)
Stable without statin.

## 2016-06-21 NOTE — Assessment & Plan Note (Signed)
BP stable on current medications.

## 2016-06-22 ENCOUNTER — Telehealth: Payer: Self-pay | Admitting: *Deleted

## 2016-06-22 LAB — TSH: TSH: 1.25 u[IU]/mL (ref 0.35–4.50)

## 2016-06-22 NOTE — Telephone Encounter (Signed)
PA archived and will not be pursued.

## 2016-06-22 NOTE — Telephone Encounter (Signed)
Fxed a letter to pharmacy yesterday to cancel rx

## 2016-06-22 NOTE — Telephone Encounter (Signed)
Received notice via covermymeds that Tolterodine is non-preferred. Preferred alternatives are:  Detrol LA, Oxybutynin IR, Oxybutynin ER, or Trospium IR first.   Please advise if one of these would be an appropriate alternative?

## 2016-06-22 NOTE — Telephone Encounter (Signed)
I asked Jasmine to cancel this medication? Was it not cancelled? I do not want her to have the medication due to potential side effects with her eye conditions.

## 2016-06-24 NOTE — Progress Notes (Deleted)
   Office Visit Note  Patient: Mary Trevino             Date of Birth: September 14, 1926           MRN: [id number]             PCP: Nance Pear., NP Referring: Debbrah Alar, NP Visit Date: 06/29/2016 Occupation: '@GUAROCC'$ @    Subjective:  No chief complaint on file.   History of Present Illness: Mary Trevino is a 83 y.o. female ***   Activities of Daily Living:  Patient reports morning stiffness for *** {minute/hour:19697}.   Patient {ACTIONS;DENIES/REPORTS:21021675::"Denies"} nocturnal pain.  Difficulty dressing/grooming: {ACTIONS;DENIES/REPORTS:21021675::"Denies"} Difficulty climbing stairs: {ACTIONS;DENIES/REPORTS:21021675::"Denies"} Difficulty getting out of chair: {ACTIONS;DENIES/REPORTS:21021675::"Denies"} Difficulty using hands for taps, buttons, cutlery, and/or writing: {ACTIONS;DENIES/REPORTS:21021675::"Denies"}   No Rheumatology ROS completed.   PMFS History:  Patient Active Problem List   Diagnosis Date Noted  . Depression 06/21/2016  . Cervico-occipital neuralgia of the right side 04/22/2016  . Right lumbar radiculopathy 04/01/2016  . Headache 04/01/2016  . History of subarachnoid hemorrhage 12/23/2015  . Legally blind 11/12/2015  . HTN (hypertension) 09/10/2015  . Glaucoma 09/10/2015  . Hyperlipidemia 09/10/2015    Past Medical History:  Diagnosis Date  . Glaucoma   . Headache   . Hyperlipidemia   . Hypertension   . Subarachnoid hemorrhage ([place]) 12/2015    Family History  Problem Relation Age of Onset  . Hypertension Mother     died of cardiac arrest age 16  . Heart attack Mother   . Other Father     natural causes  . Colon cancer Daughter     died age 29   Past Surgical History:  Procedure Laterality Date  . ABDOMINAL HYSTERECTOMY  1975  . 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   No caffeine use.     Objective: Vital Signs: There were no vitals taken for this visit.   Physical Exam   Musculoskeletal Exam: ***  CDAI Exam: No CDAI exam completed.    Investigation: Findings:  June 2017 TB gold negative, September 2017 lipid panel normal, hemoglobin A1c 5.19 March 2016 CBC normal, BMP normal, TSH normal, ESR 17, RF negative, ANA 1:40 homogeneous    Imaging: No results found.  Speciality Comments: No specialty comments available.    Procedures:  No procedures performed Allergies: Patient has no known allergies.   Assessment / Plan:     Visit Diagnoses: No diagnosis found.    Orders: No orders of the defined types were placed in this encounter.  No orders of the defined types were placed in this encounter.   Face-to-face time spent with patient was *** minutes. 50% of time was spent in counseling and coordination of care.  Follow-Up Instructions: No Follow-up on file.   Bo Merino, MD  Note - This record has been created using Editor, commissioning.  Chart creation errors have been sought, but may not always  have been located. Such creation errors do not reflect on  the standard of medical care.

## 2016-06-29 ENCOUNTER — Ambulatory Visit: Payer: Medicare Other | Admitting: Rheumatology

## 2016-07-30 ENCOUNTER — Ambulatory Visit: Payer: Medicare Other | Admitting: Rheumatology

## 2016-08-04 ENCOUNTER — Ambulatory Visit: Payer: Medicare Other | Admitting: Neurology

## 2016-08-05 DIAGNOSIS — Z947 Corneal transplant status: Secondary | ICD-10-CM | POA: Diagnosis not present

## 2016-08-05 DIAGNOSIS — Z961 Presence of intraocular lens: Secondary | ICD-10-CM | POA: Diagnosis not present

## 2016-08-05 DIAGNOSIS — T86841 Corneal transplant failure: Secondary | ICD-10-CM | POA: Diagnosis not present
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
## 2016-08-19 ENCOUNTER — Ambulatory Visit (INDEPENDENT_AMBULATORY_CARE_PROVIDER_SITE_OTHER): Payer: Medicare Other

## 2016-08-19 ENCOUNTER — Ambulatory Visit (INDEPENDENT_AMBULATORY_CARE_PROVIDER_SITE_OTHER): Payer: Medicare Other | Admitting: Rheumatology

## 2016-08-19 ENCOUNTER — Encounter: Payer: Self-pay | Admitting: Rheumatology

## 2016-08-19 VITALS — BP 118/60 | HR 78 | Resp 14 | Ht 64.0 in | Wt 154.0 lb

## 2016-08-19 DIAGNOSIS — M25552 Pain in left hip: Secondary | ICD-10-CM

## 2016-08-19 DIAGNOSIS — M5416 Radiculopathy, lumbar region: Secondary | ICD-10-CM

## 2016-08-19 DIAGNOSIS — F329 Major depressive disorder, single episode, unspecified: Secondary | ICD-10-CM | POA: Diagnosis not present

## 2016-08-19 DIAGNOSIS — Z8679 Personal history of other diseases of the circulatory system: Secondary | ICD-10-CM

## 2016-08-19 DIAGNOSIS — M7061 Trochanteric bursitis, right hip: Secondary | ICD-10-CM | POA: Diagnosis not present

## 2016-08-19 DIAGNOSIS — E785 Hyperlipidemia, unspecified: Secondary | ICD-10-CM

## 2016-08-19 DIAGNOSIS — R5383 Other fatigue: Secondary | ICD-10-CM

## 2016-08-19 DIAGNOSIS — Z947 Corneal transplant status: Secondary | ICD-10-CM | POA: Diagnosis not present

## 2016-08-19 DIAGNOSIS — M7062 Trochanteric bursitis, left hip: Secondary | ICD-10-CM

## 2016-08-19 DIAGNOSIS — R768 Other specified abnormal immunological findings in serum: Secondary | ICD-10-CM | POA: Diagnosis not present

## 2016-08-19 DIAGNOSIS — H409 Unspecified glaucoma: Secondary | ICD-10-CM

## 2016-08-19 DIAGNOSIS — M5481 Occipital neuralgia: Secondary | ICD-10-CM

## 2016-08-19 DIAGNOSIS — I10 Essential (primary) hypertension: Secondary | ICD-10-CM | POA: Diagnosis not present

## 2016-08-19 DIAGNOSIS — F32A Depression, unspecified: Secondary | ICD-10-CM

## 2016-08-19 NOTE — Patient Instructions (Signed)
Back Exercises The following exercises strengthen the muscles that help to support the back. They also help to keep the lower back flexible. Doing these exercises can help to prevent back pain or lessen existing pain. If you have back pain or discomfort, try doing these exercises 2-3 times each day or as told by your health care provider. When the pain goes away, do them once each day, but increase the number of times that you repeat the steps for each exercise (do more repetitions). If you do not have back pain or discomfort, do these exercises once each day or as told by your health care provider. Exercises Single Knee to Chest   Repeat these steps 3-5 times for each leg: 1. Lie on your back on a firm bed or the floor with your legs extended. 2. Bring one knee to your chest. Your other leg should stay extended and in contact with the floor. 3. Hold your knee in place by grabbing your knee or thigh. 4. Pull on your knee until you feel a gentle stretch in your lower back. 5. Hold the stretch for 10-30 seconds. 6. Slowly release and straighten your leg. Pelvic Tilt   Repeat these steps 5-10 times: 1. Lie on your back on a firm bed or the floor with your legs extended. 2. Bend your knees so they are pointing toward the ceiling and your feet are flat on the floor. 3. Tighten your lower abdominal muscles to press your lower back against the floor. This motion will tilt your pelvis so your tailbone points up toward the ceiling instead of pointing to your feet or the floor. 4. With gentle tension and even breathing, hold this position for 5-10 seconds. Cat-Cow   Repeat these steps until your lower back becomes more flexible: 1. Get into a hands-and-knees position on a firm surface. Keep your hands under your shoulders, and keep your knees under your hips. You may place padding under your knees for comfort. 2. Let your head hang down, and point your tailbone toward the floor so your lower back  becomes rounded like the back of a cat. 3. Hold this position for 5 seconds. 4. Slowly lift your head and point your tailbone up toward the ceiling so your back forms a sagging arch like the back of a cow. 5. Hold this position for 5 seconds. Press-Ups   Repeat these steps 5-10 times: 1. Lie on your abdomen (face-down) on the floor. 2. Place your palms near your head, about shoulder-width apart. 3. While you keep your back as relaxed as possible and keep your hips on the floor, slowly straighten your arms to raise the top half of your body and lift your shoulders. Do not use your back muscles to raise your upper torso. You may adjust the placement of your hands to make yourself more comfortable. 4. Hold this position for 5 seconds while you keep your back relaxed. 5. Slowly return to lying flat on the floor. Bridges   Repeat these steps 10 times: 1. Lie on your back on a firm surface. 2. Bend your knees so they are pointing toward the ceiling and your feet are flat on the floor. 3. Tighten your buttocks muscles and lift your buttocks off of the floor until your waist is at almost the same height as your knees. You should feel the muscles working in your buttocks and the back of your thighs. If you do not feel these muscles, slide your feet 1-2 inches farther  away from your buttocks. 4. Hold this position for 3-5 seconds. 5. Slowly lower your hips to the starting position, and allow your buttocks muscles to relax completely. If this exercise is too easy, try doing it with your arms crossed over your chest. Abdominal Crunches   Repeat these steps 5-10 times: 1. Lie on your back on a firm bed or the floor with your legs extended. 2. Bend your knees so they are pointing toward the ceiling and your feet are flat on the floor. 3. Cross your arms over your chest. 4. Tip your chin slightly toward your chest without bending your neck. 5. Tighten your abdominal muscles and slowly raise your trunk  (torso) high enough to lift your shoulder blades a tiny bit off of the floor. Avoid raising your torso higher than that, because it can put too much stress on your low back and it does not help to strengthen your abdominal muscles. 6. Slowly return to your starting position. Back Lifts  Repeat these steps 5-10 times: 1. Lie on your abdomen (face-down) with your arms at your sides, and rest your forehead on the floor. 2. Tighten the muscles in your legs and your buttocks. 3. Slowly lift your chest off of the floor while you keep your hips pressed to the floor. Keep the back of your head in line with the curve in your back. Your eyes should be looking at the floor. 4. Hold this position for 3-5 seconds. 5. Slowly return to your starting position. Contact a health care provider if:  Your back pain or discomfort gets much worse when you do an exercise.  Your back pain or discomfort does not lessen within 2 hours after you exercise. If you have any of these problems, stop doing these exercises right away. Do not do them again unless your health care provider says that you can. Get help right away if:  You develop sudden, severe back pain. If this happens, stop doing the exercises right away. Do not do them again unless your health care provider says that you can. This information is not intended to replace advice given to you by your health care provider. Make sure you discuss any questions you have with your health care provider. Document Released: 05/06/2004 Document Revised: 08/06/2015 Document Reviewed: 05/23/2014 Elsevier Interactive Patient Education  2017 [place] Ask your health care provider which exercises are safe for you. Do exercises exactly as told by your health care provider and adjust them as directed. It is normal to feel mild stretching, pulling, tightness, or discomfort as you do these exercises, but you should stop right away if you feel sudden  pain or your pain gets worse.Do not begin these exercises until told by your health care provider. Stretching and range of motion exercises These exercises warm up your muscles and joints and improve the movement and flexibility of your leg. These exercises also help to relieve pain and stiffness. Exercise A: Quadriceps stretch, prone   7. Lie on your abdomen on a firm surface, such as a bed or padded floor. 8. Bend your left / right knee and hold your ankle. If you cannot reach your ankle or pant leg, loop a belt around your foot and grab the belt instead. 9. Gently pull your heel toward your buttocks. Your knee should not slide out to the side. You should feel a stretch in the front of your thigh and knee. 10. Hold this position for __________ seconds. Repeat __________  times. Complete this exercise __________ times a day. Exercise B: Lunge (  adductor stretch) 5. Stand and spread your legs about 3 feet (about 1 m) apart. Put your left / right leg slightly back for balance. 6. Lean away from your left / right leg by bending your other knee and shifting your weight toward your bent knee. You may rest your hands on your thigh for balance. You should feel a stretch in your left / right inner thigh. 7. Hold for __________ seconds. Repeat __________ times. Complete this exercise __________ times a day. Exercise C: Hamstring stretch, supine  1. Lie on your back. 2. Hold both ends of a belt or towel as you loop it over the ball of your left / right foot. The ball of your foot is on the walking surface, right under your toes. 3. Straighten your left / right knee and slowly pull on the belt to raise your leg. Stop when you feel a gentle stretch in the back of your left / right knee or thigh.  Do not let your left / right knee bend.  Keep your other leg flat on the floor. 4. Hold this position for __________ seconds. Repeat __________ times. Complete this exercise __________ times a  day. Strengthening exercises These exercises build strength and endurance in your leg. Endurance is the ability to use your muscles for a long time, even after they get tired. Exercise D: Quadriceps wall slides  6. Lean your back against a smooth wall or door while you walk your feet out 18-24 inches (46-61 cm) from it. 7. Place your feet hip-width apart. 8. Slowly slide down the wall or door until your knees bend as far as told by your health care provider. Keep your knees over your heels, not your toes. Keep your knees in line with your hips. 9. Hold for __________ seconds. 10. Push through your heels to stand up to rest for __________ seconds after each repetition. Repeat __________ times. Complete this exercise __________ times a day. Exercise E: Straight leg raises ( hip abductors) 6. Lie on your side, with your left / right leg in the top position. Lie so your head, shoulder, knee, and hip line up with each other. You may bend your bottom knee to help you balance. 7. Lift your top leg 4-6 inches (10-15 cm) while keeping your toes pointed straight ahead. 8. Hold this position for __________ seconds. 9. Slowly lower your leg to the starting position. Allow your muscles to relax completely after each repetition. Repeat __________ times. Complete this exercise __________ times a day. Exercise F: Straight leg raises ( hip extensors) 7. Lie on your abdomen on a firm surface. You can put a pillow under your hips if that is more comfortable. 8. Tense the muscles in your buttocks and lift your left / right leg about 4-6 inches (10-15 cm). Keep your knee straight as you lift your leg. 9. Hold this position for __________ seconds. 10. Slowly lower your leg to the starting position. 11. Let your leg relax completely after each repetition. Repeat __________ times. Complete this exercise __________ times a day. Exercise G: Bridge ( hip extensors) 1. Lie on your back on a firm surface with your  knees bent and your feet flat on the floor. 2. Tighten your buttocks muscles and lift your bottom off the floor until your trunk is level with your thighs.  Do not arch your back.  You should feel the muscles working in your buttocks  and the back of your thighs. If you do not feel these muscles, slide your feet 1-2 inches (2.5-5 cm) farther away from your buttocks. 3. Hold this position for __________ seconds. 4. Slowly lower your hips to the starting position. 5. Let your buttocks muscles relax completely between repetitions. 6. If this exercise is too easy, try doing it with your arms crossed over your chest. Repeat __________ times. Complete this exercise __________ times a day. This information is not intended to replace advice given to you by your health care provider. Make sure you discuss any questions you have with your health care provider. Document Released: 03/29/2005 Document Revised: 12/04/2015 Document Reviewed: 03/11/2015 Elsevier Interactive Patient Education  2017 [employer].

## 2016-09-21 ENCOUNTER — Encounter: Payer: Self-pay | Admitting: Family

## 2016-09-21 ENCOUNTER — Ambulatory Visit (INDEPENDENT_AMBULATORY_CARE_PROVIDER_SITE_OTHER): Payer: Medicare Other | Admitting: Family

## 2016-09-21 VITALS — BP 108/64 | HR 61 | Temp 98.7°F | Resp 16 | Ht 65.0 in | Wt 154.8 lb

## 2016-09-21 DIAGNOSIS — F32A Depression, unspecified: Secondary | ICD-10-CM

## 2016-09-21 DIAGNOSIS — F329 Major depressive disorder, single episode, unspecified: Secondary | ICD-10-CM | POA: Diagnosis not present

## 2016-09-21 DIAGNOSIS — N3281 Overactive bladder: Secondary | ICD-10-CM

## 2016-09-21 DIAGNOSIS — I10 Essential (primary) hypertension: Secondary | ICD-10-CM

## 2016-09-21 LAB — BASIC METABOLIC PANEL
BUN: 18 mg/dL (ref 6–23)
CHLORIDE: 107 meq/L (ref 96–112)
CO2: 29 meq/L (ref 19–32)
Calcium: 9.6 mg/dL (ref 8.4–10.5)
Creatinine, Ser: 0.92 mg/dL (ref 0.40–1.20)
GFR: 74.15 mL/min (ref >60.00)
GLUCOSE: 100 mg/dL — AB (ref 70–99)
POTASSIUM: 4.4 meq/L (ref 3.5–5.1)
Sodium: 142 mEq/L (ref 135–145)

## 2016-09-21 MED ORDER — MIRABEGRON ER 25 MG PO TB24: 25.0000 mg | ORAL_TABLET | Freq: Every day | ORAL | 1 refills | Status: DC

## 2016-09-21 NOTE — Assessment & Plan Note (Signed)
Stable off meds. Monitor.

## 2016-09-21 NOTE — Progress Notes (Signed)
Subjective:    Patient ID: Mary Trevino, female    DOB: [birth year], 83 y.o.   MRN: [id number]  HPI  [name] is an 83 yr old female who present today for follow up.  1) HTN- maintained on lasix prn and toprol.  BP Readings from Last 3 Encounters:  09/21/16 108/64  08/19/16 118/60  06/21/16 115/65   2) Depression- not currently on meds.   3) ANA positive- saw Dr. Estanislado Pandy was told no autoimmune disease.  4) OAB-continues to get up multiple times at night.   Review of Systems    see HPI  Past Medical History:  Diagnosis Date  . Glaucoma   . Headache   . Hyperlipidemia   . Hypertension   . Subarachnoid hemorrhage ([place]) 12/2015     Social History   Social History  . Marital status: Widowed    Spouse name: N/A  . Number of children: 3  . Years of education: HS   Occupational History  . Retied    Social History Main Topics  . Smoking status: Former Smoker    Years: 10.00  . Smokeless tobacco: Never Used  . Alcohol use No  . Drug use: No  . Sexual activity: Not on file   Other Topics Concern  . 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   No caffeine use.    Past Surgical History:  Procedure Laterality Date  . ABDOMINAL HYSTERECTOMY  1975  . CORNEAL TRANSPLANT     2016 (left) 2014 (right)     Family History  Problem Relation Age of Onset  . Hypertension Mother        died of cardiac arrest age 30  . Heart attack Mother   . Other Father        natural causes  . Colon cancer Daughter        died age 69    No Known Allergies  Current Outpatient Prescriptions on File Prior to Visit  Medication Sig Dispense Refill  . acetaminophen (TYLENOL) 325 MG tablet Take 650 mg by mouth every 4 (four) hours as needed.    [name] atorvastatin  (LIPITOR) 10 MG tablet Take 1 tablet (10 mg total) by mouth daily. 90 tablet 1  . bimatoprost (LUMIGAN) 0.01 % SOLN Place 1 drop into the right eye nightly    . brimonidine-timolol (COMBIGAN) 0.2-0.5 % ophthalmic solution Place 1 drop into both eyes twice a day    . dorzolamide (TRUSOPT) 2 % ophthalmic solution Place 1 drop into the right eye daily    . furosemide (LASIX) 20 MG tablet Take 1 tablet (20 mg total) by mouth daily as needed for edema. 30 tablet 1  . gabapentin (NEURONTIN) 300 MG capsule Take 300 mg by mouth at bedtime.    [name] loteprednol (LOTEMAX) 0.5 % ophthalmic suspension Place 1 drop into the right eye.     . metoprolol succinate (TOPROL XL) 25 MG 24 hr tablet Take 1 tablet (25 mg total) by mouth daily. 90 tablet 3  . prednisoLONE acetate (PRED FORTE) 1 % ophthalmic suspension Place 1 drop into the left eye twice a day    . nortriptyline (PAMELOR) 10 MG capsule Take 2 capsules (20 mg total)  by mouth at bedtime. (Patient not taking: Reported on 08/19/2016) 60 capsule 11   No current facility-administered medications on file prior to visit.     BP 108/64 (BP Location: Left Arm, Cuff Size: Normal)   Pulse 61   Temp 98.7 F (37.1 C) (Oral)   Resp 16   Ht 5\' 5"  (1.651 m)   Wt 154 lb 12.8 oz (70.2 kg)   SpO2 100%   BMI 25.76 kg/m    Objective:   Physical Exam  Constitutional: She appears well-developed and well-nourished.  Cardiovascular: Normal rate, regular rhythm and normal heart sounds.   No murmur heard. Pulmonary/Chest: Effort normal and breath sounds normal. No respiratory distress. She has no wheezes.  Musculoskeletal:  2+ bilateral LE edema  Psychiatric: She has a normal mood and affect. Her behavior is normal. Judgment and thought content normal.          Assessment & Plan:

## 2016-09-21 NOTE — Assessment & Plan Note (Signed)
Trial of myrbetriq

## 2016-09-21 NOTE — Patient Instructions (Signed)
Please complete lab work prior to leaving. Start Myrbetriq (for overactive bladder). [facility].

## 2016-09-21 NOTE — Assessment & Plan Note (Signed)
bp is stable on current meds. Continue same, advised pt OK to take lasix daily prn due to ongoing swelling. Check follow up bmet to assess electrolytes.

## 2016-09-22 ENCOUNTER — Ambulatory Visit: Payer: Medicare Other | Admitting: Rheumatology

## 2016-09-24 ENCOUNTER — Telehealth: Payer: Self-pay | Admitting: *Deleted

## 2016-09-24 NOTE — Telephone Encounter (Signed)
Received [place] form from [facility] on Myrbetriq, completed and faxed back for Approval/SLS 06/15

## 2016-09-27 NOTE — Telephone Encounter (Signed)
Received denial from [employer] on Myrbetriq stating coverage is provided in situations where the patient's creatinine clearance is greater than or equal to 23mL/min.  Please advise?

## 2016-09-29 ENCOUNTER — Encounter: Payer: Self-pay | Admitting: Family

## 2016-09-29 NOTE — Telephone Encounter (Signed)
Received fax from [facility] stating that Myrbetriq was Denied because it is "part of a program called Step Therapy and requires a trial of a step-one medication before a more expensive..." ; the enclosed step-therapy list for Step-One medications are as follows: Oxybutynin ER, Detrol LA, Oxybutynin IR syrup, Trospium Chloride 20 mg tablet, Oxybutynin IR. The only past medication in Historical medication list is tolterodine (DETROL) 2 MG tablet. Please Advise/SLS 06/20

## 2016-09-29 NOTE — Telephone Encounter (Signed)
Fax letter from provider to [facility] at [phone] to request that Myrbetriq be approved d/t step therapy medications could be detrimental to patient's health ["Glaucoma, legally blind with small amount of remaining vision"], directed as Urgent to the Health Director per provider instructions/SLS 06/20

## 2016-09-29 NOTE — Telephone Encounter (Signed)
Please see letter for her insurer.

## 2016-10-07 DIAGNOSIS — H538 Other visual disturbances: Secondary | ICD-10-CM | POA: Diagnosis not present

## 2016-10-07 DIAGNOSIS — H53452 Other localized visual field defect, left eye: Secondary | ICD-10-CM | POA: Diagnosis not present

## 2016-10-18 ENCOUNTER — Other Ambulatory Visit: Payer: Self-pay | Admitting: Family

## 2016-10-20 ENCOUNTER — Telehealth: Payer: Self-pay | Admitting: Family

## 2016-10-20 NOTE — Telephone Encounter (Signed)
Melissa--We have not prescribed gabapentin for pt before. Please advise request?

## 2016-10-20 NOTE — Telephone Encounter (Signed)
Relation to AG:TXMI Call back [phone] Pharmacy: [facility], [place] [phone] ([phone] (Fax)     Reason for call:  Patient requesting a 3 month supply gabapentin (NEURONTIN) 300 MG capsule please send to mail order

## 2016-10-21 MED ORDER — GABAPENTIN 300 MG PO CAPS: 300.0000 mg | ORAL_CAPSULE | Freq: Every day | ORAL | 1 refills | Status: DC

## 2016-10-27 ENCOUNTER — Telehealth: Payer: Self-pay | Admitting: Family

## 2016-10-27 NOTE — Telephone Encounter (Signed)
Relation to WC:BJSE Call back [phone] Pharmacy: [facility] [place], [place] - [address] [phone] (Phone) [phone] (Fax)     Reason for call:  patient states when she use to live in [place] pain management prescribe patches for her left shoulder pain, patient scheduled for Friday 10/29/16 with NP regarding concerns. Patient states will contact previous physican and request records fax to [phone].

## 2016-10-29 ENCOUNTER — Ambulatory Visit (INDEPENDENT_AMBULATORY_CARE_PROVIDER_SITE_OTHER): Payer: Medicare Other | Admitting: Family

## 2016-10-29 VITALS — BP 136/64 | HR 58 | Temp 98.4°F | Ht 65.0 in | Wt 149.8 lb

## 2016-10-29 DIAGNOSIS — K5900 Constipation, unspecified: Secondary | ICD-10-CM

## 2016-10-29 DIAGNOSIS — R0989 Other specified symptoms and signs involving the circulatory and respiratory systems: Secondary | ICD-10-CM

## 2016-10-29 DIAGNOSIS — M5412 Radiculopathy, cervical region: Secondary | ICD-10-CM

## 2016-10-29 DIAGNOSIS — F458 Other somatoform disorders: Secondary | ICD-10-CM | POA: Diagnosis not present

## 2016-10-29 MED ORDER — METHYLPREDNISOLONE 4 MG PO TBPK: ORAL_TABLET | ORAL | 0 refills | Status: DC

## 2016-10-29 MED ORDER — RANITIDINE HCL 150 MG PO CAPS: 150.0000 mg | ORAL_CAPSULE | Freq: Two times a day (BID) | ORAL | 2 refills | Status: DC

## 2016-10-29 NOTE — Progress Notes (Signed)
Subjective:    Patient ID: Mary Trevino, female    DOB: 12-23-1926, 83 y.o.   MRN: [id number]  HPI  Mary Trevino is a 83 yr old female who presents today with c/o left shoulder pain.  Reports that pain radiates down the left arm. Started 5 days ago.   Was seen by pain management in 2016 in [place],  Has previous pain previously in the same place.  Has had ESI's in the past which really helped her.    She reports that she is only having BM every 3-4 days.  Always feels "like something is in my throat."  Worst when laying flat at night.    Review of Systems See HPI  Past Medical History:  Diagnosis Date  . Glaucoma   . Headache   . Hyperlipidemia   . Hypertension   . Subarachnoid hemorrhage ([place]) 12/2015     Social History   Social History  . Marital status: Widowed    Spouse name: N/A  . Number of children: 3  . Years of education: HS   Occupational History  . Retied    Social History Main Topics  . Smoking status: Former Smoker    Years: 10.00  . Smokeless tobacco: Never Used  . Alcohol use No  . Drug use: No  . Sexual activity: Not on file   Other Topics Concern  . 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   No caffeine use.    Past Surgical History:  Procedure Laterality Date  . ABDOMINAL HYSTERECTOMY  1975  . CORNEAL TRANSPLANT     2016 (left) 2014 (right)     Family History  Problem Relation Age of Onset  . Hypertension Mother        died of cardiac arrest age 34  . Heart attack Mother   . Other Father        natural causes  . Colon cancer Daughter        died age 46    No Known Allergies  Current Outpatient Prescriptions on File Prior to Visit  Medication Sig Dispense Refill  . acetaminophen (TYLENOL) 325 MG tablet Take 650  mg by mouth every 4 (four) hours as needed.    [name] atorvastatin (LIPITOR) 10 MG tablet TAKE 1 TABLET DAILY 90 tablet 1  . bimatoprost (LUMIGAN) 0.01 % SOLN Place 1 drop into the right eye nightly    . brimonidine-timolol (COMBIGAN) 0.2-0.5 % ophthalmic solution Place 1 drop into both eyes twice a day    . dorzolamide (TRUSOPT) 2 % ophthalmic solution Place 1 drop into the right eye daily    . furosemide (LASIX) 20 MG tablet Take 1 tablet (20 mg total) by mouth daily as needed for edema. 30 tablet 1  . gabapentin (NEURONTIN) 300 MG capsule Take 1 capsule (300 mg total) by mouth at bedtime. 90 capsule 1  . loteprednol (LOTEMAX) 0.5 % ophthalmic suspension Place 1 drop into the right eye.     . metoprolol succinate (TOPROL XL) 25 MG 24 hr tablet Take 1 tablet (25 mg total) by mouth daily. 90 tablet 3  . mirabegron ER (MYRBETRIQ) 25 MG TB24 tablet Take 1 tablet (25 mg total) by mouth daily.  For overactive bladder 90 tablet 1  . prednisoLONE acetate (PRED FORTE) 1 % ophthalmic suspension Place 1 drop into the left eye twice a day     No current facility-administered medications on file prior to visit.     BP 136/64   Pulse (!) 58   Temp 98.4 F (36.9 C) (Oral)   Ht 5\' 5"  (1.651 m)   Wt 149 lb 12.8 oz (67.9 kg)   SpO2 100%   BMI 24.93 kg/m       Objective:   Physical Exam  Constitutional: She is oriented to person, place, and time. She appears well-developed and well-nourished.  Cardiovascular: Normal rate, regular rhythm and normal heart sounds.   No murmur heard. Pulmonary/Chest: Effort normal and breath sounds normal. No respiratory distress. She has no wheezes.  Neurological: She is alert and oriented to person, place, and time.  Reflex Scores:      Brachioradialis reflexes are 2+ on the right side and 2+ on the left side.      Patellar reflexes are 2+ on the right side and 2+ on the left side. Bilateral UE/LE strength is 5/5  Psychiatric: She has a normal Trevino and affect. Her  behavior is normal. Judgment and thought content normal.          Assessment & Plan:  Cervical radicular pain- will rx with medrol dose pak and place referral to pain management.  Globus sensation- trial of zantac for probable GERD.  Constipation- Pt advised as follows: add metamucil once daily (you can use the stir in version, fiber wafers or capsules) these are available over the counter. Drink plently of water and make sure that you eat lots of fresh fruits/veggies.

## 2016-10-29 NOTE — Patient Instructions (Signed)
Please begin medrol dose pak (steroid) for your neck/arm pain. You will be contacted about your referral to pain management- this can take a month or more. Call if new/worsening neck pain/arm pain or if you develop weakness/numbness. Begin zantac twice daily for the sensation in your throat- I think this may be related to acid reflux.  For constipation- add metamucil once daily (you can use the stir in version, fiber wafers or capsules) these are available over the counter. Drink plently of water and make sure that you eat lots of fresh fruits/veggies.

## 2016-11-01 ENCOUNTER — Encounter: Payer: Self-pay | Admitting: Family

## 2016-11-01 ENCOUNTER — Telehealth: Payer: Self-pay | Admitting: Family

## 2016-11-01 NOTE — Telephone Encounter (Signed)
Patient daughter called and patient is needing a note stating that she couldn't not fly for her flight. Patient is suppose to fly out wed and needs note by tomorrow to get her refund. She cannot fly due to same illness she came in for 10/29/16. \   [phone] Daughter Jamelle Haring

## 2016-11-01 NOTE — Telephone Encounter (Signed)
Please advise   Pc 

## 2016-11-01 NOTE — Telephone Encounter (Signed)
See letter.

## 2016-11-01 NOTE — Telephone Encounter (Signed)
Called patients daughter and left detailed message about letter being picked up.    PC

## 2016-11-02 ENCOUNTER — Encounter: Payer: Self-pay | Admitting: Medical

## 2016-11-02 ENCOUNTER — Ambulatory Visit (INDEPENDENT_AMBULATORY_CARE_PROVIDER_SITE_OTHER): Payer: Medicare Other | Admitting: Medical

## 2016-11-02 ENCOUNTER — Ambulatory Visit (HOSPITAL_BASED_OUTPATIENT_CLINIC_OR_DEPARTMENT_OTHER)
Admission: RE | Admit: 2016-11-02 | Discharge: 2016-11-02 | Disposition: A | Discharge status: Home / Self Care | Payer: Medicare Other | Source: Ambulatory Visit | Attending: Medical | Admitting: Medical

## 2016-11-02 ENCOUNTER — Telehealth: Payer: Self-pay | Admitting: Family

## 2016-11-02 VITALS — BP 141/82 | HR 78 | Temp 99.0°F | Resp 16 | Ht 65.0 in | Wt 148.0 lb

## 2016-11-02 DIAGNOSIS — X58XXXD Exposure to other specified factors, subsequent encounter: Secondary | ICD-10-CM | POA: Diagnosis not present

## 2016-11-02 DIAGNOSIS — M792 Neuralgia and neuritis, unspecified: Secondary | ICD-10-CM | POA: Diagnosis not present

## 2016-11-02 DIAGNOSIS — M542 Cervicalgia: Secondary | ICD-10-CM | POA: Diagnosis not present

## 2016-11-02 DIAGNOSIS — S46812D Strain of other muscles, fascia and tendons at shoulder and upper arm level, left arm, subsequent encounter: Secondary | ICD-10-CM

## 2016-11-02 DIAGNOSIS — M5030 Other cervical disc degeneration, unspecified cervical region: Secondary | ICD-10-CM | POA: Insufficient documentation

## 2016-11-02 MED ORDER — TRAMADOL HCL 50 MG PO TABS: ORAL_TABLET | ORAL | 0 refills | Status: DC

## 2016-11-02 MED ORDER — KETOROLAC TROMETHAMINE 30 MG/ML IJ SOLN
30.0000 mg | Freq: Once | INTRAMUSCULAR | Status: AC
  Administered 2016-11-02: 30 mg via INTRAVENOUS

## 2016-11-02 MED ORDER — DICLOFENAC POTASSIUM 50 MG PO TABS: 50.0000 mg | ORAL_TABLET | Freq: Two times a day (BID) | ORAL | 0 refills | Status: DC

## 2016-11-02 MED FILL — DICLOFENAC SOD EC 50 MG TAB: 50 | 7 days supply | Qty: 14 | Fill #0

## 2016-11-02 MED FILL — traMADol HCL 50 MG TABS: 50 | 5 days supply | Qty: 5 | Fill #0

## 2016-11-02 NOTE — Progress Notes (Signed)
Subjective:    Patient ID: Mary Trevino, female    DOB: 06/27/1926, 83 y.o.   MRN: [id number]  HPI  Pt in for follow up from recent severe pain. Pt seen by pcp just recenlty. Pt has hx of severe pain left side of neck that radiates to shoulder and arm(no new features per pt and daughter). Pt given steroid pack but pain not resolving. Pt is about the same. No improvement. Pt not sure if she ever had toradol in the past.   Pt has seen pain management in the past for this pain. No chest pain or shortness of breath. Asked if any rash on her back and daughter indicates this is same pain as the other day/not shingles. Pt does not report a rash.  On review good kidney function no report of Gi intolerance with nsaids.  Describes severe pain unable to sleep do to pain.    Review of Systems  Constitutional: Negative for chills, fatigue and fever.  Respiratory: Negative for cough, chest tightness, shortness of breath and wheezing.   Cardiovascular: Negative for chest pain and palpitations.  Gastrointestinal: Negative for abdominal pain.  Musculoskeletal: Positive for neck pain. Negative for back pain and joint swelling.  Skin: Negative for rash.  Neurological: Negative for dizziness, seizures, syncope, weakness and headaches.  Hematological: Negative for adenopathy. Does not bruise/bleed easily.  Psychiatric/Behavioral: Negative for behavioral problems, confusion and self-injury.    Past Medical History:  Diagnosis Date  . Glaucoma   . Headache   . Hyperlipidemia   . Hypertension   . Subarachnoid hemorrhage ([place]) 12/2015     Social History   Social History  . Marital status: Widowed    Spouse name: N/A  . Number of children: 3  . Years of education: HS   Occupational History  . Retied    Social History Main Topics  . Smoking status: Former Smoker    Years: 10.00  . Smokeless tobacco: Never Used  . Alcohol use No  . Drug use: No  . Sexual activity: Not on file   Other  Topics Concern  . 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   No caffeine use.    Past Surgical History:  Procedure Laterality Date  . ABDOMINAL HYSTERECTOMY  1975  . CORNEAL TRANSPLANT     2016 (left) 2014 (right)     Family History  Problem Relation Age of Onset  . Hypertension Mother        died of cardiac arrest age 59  . Heart attack Mother   . Other Father        natural causes  . Colon cancer Daughter        died age 83    No Known Allergies  Current Outpatient Prescriptions on File Prior to Visit  Medication Sig Dispense Refill  . acetaminophen (TYLENOL) 325 MG tablet Take 650 mg by mouth every 4 (four) hours as needed.    [name] atorvastatin (LIPITOR) 10 MG tablet TAKE 1 TABLET DAILY 90 tablet 1  . bimatoprost (LUMIGAN) 0.01 % SOLN Place 1 drop into the right eye nightly    . brimonidine-timolol (COMBIGAN) 0.2-0.5 % ophthalmic solution Place 1 drop into both eyes twice a day    .  dorzolamide (TRUSOPT) 2 % ophthalmic solution Place 1 drop into the right eye daily    . furosemide (LASIX) 20 MG tablet Take 1 tablet (20 mg total) by mouth daily as needed for edema. 30 tablet 1  . gabapentin (NEURONTIN) 300 MG capsule Take 1 capsule (300 mg total) by mouth at bedtime. 90 capsule 1  . loteprednol (LOTEMAX) 0.5 % ophthalmic suspension Place 1 drop into the right eye.     . methylPREDNISolone (MEDROL DOSEPAK) 4 MG TBPK tablet Take as directed 21 tablet 0  . metoprolol succinate (TOPROL XL) 25 MG 24 hr tablet Take 1 tablet (25 mg total) by mouth daily. 90 tablet 3  . mirabegron ER (MYRBETRIQ) 25 MG TB24 tablet Take 1 tablet (25 mg total) by mouth daily. For overactive bladder 90 tablet 1  . prednisoLONE acetate (PRED FORTE) 1 % ophthalmic suspension Place 1 drop  into the left eye twice a day    . ranitidine (ZANTAC) 150 MG capsule Take 1 capsule (150 mg total) by mouth 2 (two) times daily. 30 capsule 2   No current facility-administered medications on file prior to visit.     BP (!) 141/82   Pulse 78   Temp 99 F (37.2 C) (Oral)   Resp 16   Ht 5\' 5"  (1.651 m)   Wt 148 lb (67.1 kg)   SpO2 99%   BMI 24.63 kg/m       Objective:   Physical Exam  General Mental Status- Alert. General Appearance- Not in acute distress.   Skin General: Color- Normal Color. Moisture- Normal Moisture.  Neck Carotid Arteries- Normal color. Moisture- Normal Moisture. No carotid bruits. No JVD. On palpation of the mid neck mild tender. Pain on palpation of the left trapezius.   Chest and Lung Exam Auscultation: Breath Sounds:-Normal.  Cardiovascular Auscultation:Rythm- Regular. Murmurs & Other Heart Sounds:Auscultation of the heart reveals- No Murmurs.  Abdomen Inspection:-Inspeection Normal. Palpation/Percussion:Note:No mass. Palpation and Percussion of the abdomen reveal- Non Tender, Non Distended + BS, no rebound or guarding.  Neurologic Cranial Nerve exam:- CN III-XII intact(No nystagmus), symmetric smile. Strength:- 5/5 equal and symmetric strength both upper and lower extremities.      Assessment & Plan:  For severe neck pain with radiating features we gave toradol 30 mg im injection.  Continue the medrol dose pack until you are finished. Then start diclofenac rx.(but be careful not to use any nsaids over the counter while on diclofenac.  Will rx tramadol to use at night for next 5 days. (after you use these tabs will need to see if your pcp would prescribe further)  Xray of cervical spine today and will notify you of the results when those are in.  Follow up in 7 days or as needed

## 2016-11-02 NOTE — Patient Instructions (Addendum)
For severe neck pain with radiating features we gave toradol 30 mg im injection.  Continue the medrol dose pack until you are finished. Then start diclofenac rx.(but be careful not to use any nsaids over the counter while on diclofenac.  Will rx tramadol to use at night for next 5 days. (after you use these tabs will need to see if your pcp would prescribe further)  Xray of cervical spine today and will notify you of the results when those are in.  Follow up in 7 days or as needed

## 2016-11-02 NOTE — Telephone Encounter (Signed)
Pt called in because she said that she is having back pain. Pt says that she would like a call back to be advised. Pt says that she called in at 10am and has been waiting for a call back. (not showing previous note)   Transferred pt to team health to speak with a nurse to be advised while waiting for a call back

## 2016-11-02 NOTE — Telephone Encounter (Signed)
Patient stating she's in extreme shoulder pain and states medication prescribed is not working, requesting patches or alternative, please advise

## 2016-11-02 NOTE — Telephone Encounter (Signed)
Appt w/ Percell Miller 11/02/2016 at 3:30 PM.

## 2016-11-02 NOTE — Telephone Encounter (Signed)
[facility] - Client [place]  Patient Name: Mary Trevino  DOB: [birth year]    Initial Comment Caller says, has back pains, she was seen on 10/29/16 for the back pain, was given steroids, which are not helping. -- she called at 10 am to speak w/ the office, but has not heard back from anyone, The office said they wrew short staffed today.    Nurse Assessment  Nurse: Harlow Mares, RN, Suanne Marker Date/Time (Eastern Time): 11/02/2016 11:41:59 AM  Confirm and document reason for call. If symptomatic, describe symptoms. ---Caller says, has back pains, she was seen on 10/29/16 for the back pain, was given steroids, which are not helping. - having pain in back and left shoulder. Used to go to pain management.  Does the patient have any new or worsening symptoms? ---Yes  Will a triage be completed? ---Yes  Related visit to physician within the last 2 weeks? ---Yes  Does the PT have any chronic conditions? (i.e. diabetes, asthma, etc.) ---Yes  List chronic conditions. ---HTN ; high chol;  Is this a behavioral health or substance abuse call? ---No     Guidelines    Guideline Title Affirmed Question Affirmed Notes  Back Pain [1] SEVERE back pain (e.g., excruciating) AND [2] sudden onset AND [3] age > 33    Final Disposition User   Go to ED Now Harlow Mares, RN, Suanne Marker    Comments  Appt scheduled for today at 3:30p with Evern Core at the [facility] office. Patient's daughter refused to take her to the ED.   Referrals  GO TO FACILITY REFUSED   Disagree/Comply: Disagree  Disagree/Comply Reason: Disagree with instructions

## 2016-11-09 ENCOUNTER — Telehealth: Payer: Self-pay | Admitting: Family

## 2016-11-09 NOTE — Telephone Encounter (Signed)
Mary Trevino-- pt had PPD 11/12/15. Called facility to see if they could accept that result and they will have to call me back. Spoke with pt. She will be staying at facility at the end of August into the first of September. Family will be out of town x 10 days. Pt has form that will need to be completed as well. Upon chart review, it appears that pt has scheduled appt on 11/19/16 with PCP for PPD testing and form completion. She will see if her daughter can drop form off in our office this week. Pt also has f/u on 11/26/16 from her back pain. Can pt do both on 8/10 or should she keep appt on 11/26/16 as well?

## 2016-11-09 NOTE — Telephone Encounter (Signed)
Received call from [place] at [facility] requesting that we fax FL2 and PPD result to her attn @ [phone] when available.

## 2016-11-09 NOTE — Telephone Encounter (Signed)
°  Relation to IR:WERX Call back number:[phone]  Reason for call:  Patient family is going out of town requesting PPD due to a short stay at [facility], patient requesting orders. Patient states she has forms for NP to fill out advised 5 to 7 business day turnaround, please advise If patient needs appointment with NP

## 2016-11-10 NOTE — Telephone Encounter (Signed)
Let's keep 8/17 for now and we can decide on the 10 the if we need to cancel.

## 2016-11-11 ENCOUNTER — Telehealth: Payer: Self-pay | Admitting: Family

## 2016-11-11 DIAGNOSIS — M5412 Radiculopathy, cervical region: Secondary | ICD-10-CM

## 2016-11-11 NOTE — Telephone Encounter (Signed)
Do not know of appt yet for pain management, records are in review

## 2016-11-11 NOTE — Telephone Encounter (Signed)
Caller name:[name] Relationship to patient: Can be [phone] Pharmacy:  Reason for call:Patient called to check status of pain management referral, [facility]. Can take 6-8 weeks for appt. Patient states she is in a lot of pain, anything else we can do?

## 2016-11-12 MED ORDER — GABAPENTIN 100 MG PO CAPS: ORAL_CAPSULE | ORAL | 0 refills | Status: DC

## 2016-11-12 NOTE — Telephone Encounter (Signed)
Let's put her in for MRI of c spine please, dx radicular pain neck. Present greater than 1 month, no improvement with NSAIDs or steroids. Also can add gabapentin 100mg  TID # 90.

## 2016-11-12 NOTE — Telephone Encounter (Signed)
Have her increase her Gabapentin from 300 qhs to 100 mg po bid and 300 mg qhs. She may just need more frequent dosing. Needs 2 rx Gabapentin 300mg  tab, 1 tab po qhs disp #30 and Gabapentin 100 mg tab, 1 tab po bid in am and at noon. Disp #60

## 2016-11-12 NOTE — Telephone Encounter (Signed)
Pt called back and was notified of below recommendation and transferred to imaging to schedule MRI. Pt states she already takes gabapentin 300mg  at bedtime and it hasn't helped her pain.  Please see below note and advise what pt can do for her pain?

## 2016-11-12 NOTE — Telephone Encounter (Signed)
Notified pt and she is agreeable to try increase of gabapentin. Rx sent to [facility].

## 2016-11-12 NOTE — Telephone Encounter (Signed)
Left message for pt to return my call.

## 2016-11-15 ENCOUNTER — Telehealth: Payer: Self-pay | Admitting: Family

## 2016-11-15 NOTE — Telephone Encounter (Signed)
lvm advising daughter of message below.  °

## 2016-11-15 NOTE — Telephone Encounter (Signed)
Caller name: Caryn Section  Relation to RK:[id number]  Call back [phone]   Reason for call:  Daughter states mother is experiencing shoulder and back pain ,requesting shot, daughter states doesn't know the name of shot that was given before but it relieved pain, please advise

## 2016-11-15 NOTE — Telephone Encounter (Signed)
Looks like she was given toradol last time. We will need to see her in the office first before we could give her another injection.

## 2016-11-16 ENCOUNTER — Encounter: Payer: Self-pay | Admitting: Family

## 2016-11-16 ENCOUNTER — Telehealth: Payer: Self-pay | Admitting: Family

## 2016-11-16 ENCOUNTER — Ambulatory Visit (INDEPENDENT_AMBULATORY_CARE_PROVIDER_SITE_OTHER): Payer: Medicare Other | Admitting: Family

## 2016-11-16 VITALS — BP 113/51 | HR 65 | Temp 98.6°F | Ht 65.0 in | Wt 151.0 lb

## 2016-11-16 DIAGNOSIS — M5412 Radiculopathy, cervical region: Secondary | ICD-10-CM

## 2016-11-16 DIAGNOSIS — Z111 Encounter for screening for respiratory tuberculosis: Secondary | ICD-10-CM

## 2016-11-16 DIAGNOSIS — Z79899 Other long term (current) drug therapy: Secondary | ICD-10-CM | POA: Diagnosis not present

## 2016-11-16 MED ORDER — TRAMADOL HCL 50 MG PO TABS: 50.0000 mg | ORAL_TABLET | Freq: Two times a day (BID) | ORAL | 0 refills | Status: DC | PRN

## 2016-11-16 MED ORDER — KETOROLAC TROMETHAMINE 30 MG/ML IJ SOLN
30.0000 mg | Freq: Once | INTRAMUSCULAR | Status: AC
  Administered 2016-11-16: 30 mg via INTRAVENOUS

## 2016-11-16 MED FILL — traMADol HCL 50 MG TABS: 50 | 15 days supply | Qty: 30 | Fill #0

## 2016-11-16 NOTE — Patient Instructions (Signed)
We will work on getting you set up with Dr. Letta Pate for your neck pain. You may use tramadol 2 times daily. Follow up on Wednesday for PPD reading.  Follow up in 3 months for office visit.

## 2016-11-16 NOTE — Progress Notes (Signed)
Subjective:    Patient ID: Mary Trevino, female    DOB: [birth year], 83 y.o.   MRN: [id number]  HPI  [name] is an 83 yr old female who presents today with chief complaint of pain in the upper back/left shoulder and left side.  I saw her for similar cervical radicular pain on 10/29/16 and she was given an rx for medrol dose pak on 10/29/16.  She then returned on 11/02/16 and saw Evern Core Trevino. She was given an IM injection of toradol and advised to complete the medrol dose pack. Had some improvement with toradol, less with diclofenac. Some improvement with tramadol.   She was then given rx for diclofenac to start.  She underwent an x-ray of the Cspine on 7/24 which noted moderate to marked multilevel degenerative changes.  Due to complaint of ongoing symptoms we did place an order for MRI of the Cspine which is scheduled for 8/11.  A referral was also made to pain management on 10/29/16. She is also maintained on gabapentin which she states is not helping.  Daughter reports similar symptoms in the past which were resolved with ESI.     Review of Systems See HPI  Past Medical History:  Diagnosis Date  . Glaucoma   . Headache   . Hyperlipidemia   . Hypertension   . Subarachnoid hemorrhage ([place]) 12/2015     Social History   Social History  . Marital status: Widowed    Spouse name: N/A  . Number of children: 3  . Years of education: HS   Occupational History  . Retied    Social History Main Topics  . Smoking status: Former Smoker    Years: 10.00  . Smokeless tobacco: Never Used  . Alcohol use No  . Drug use: No  . Sexual activity: Not on file   Other Topics Concern  . 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   No caffeine use.    Past Surgical  History:  Procedure Laterality Date  . ABDOMINAL HYSTERECTOMY  1975  . CORNEAL TRANSPLANT     2016 (left) 2014 (right)     Family History  Problem Relation Age of Onset  . Hypertension Mother        died of cardiac arrest age 52  . Heart attack Mother   . Other Father        natural causes  . Colon cancer Daughter        died age 28    No Known Allergies  Current Outpatient Prescriptions on File Prior to Visit  Medication Sig Dispense Refill  . acetaminophen (TYLENOL) 325 MG tablet Take 650 mg by mouth every 4 (four) hours as needed.    [name] atorvastatin (LIPITOR) 10 MG tablet TAKE 1 TABLET DAILY 90 tablet 1  . bimatoprost (LUMIGAN) 0.01 % SOLN Place 1 drop into the right eye nightly    . brimonidine-timolol (COMBIGAN) 0.2-0.5 % ophthalmic solution Place 1 drop into both eyes twice a day    . diclofenac (CATAFLAM) 50 MG tablet Take 1 tablet (50 mg total) by mouth 2 (two) times daily. 14 tablet 0  . dorzolamide (TRUSOPT) 2 % ophthalmic solution Place 1 drop into the right  eye daily    . furosemide (LASIX) 20 MG tablet Take 1 tablet (20 mg total) by mouth daily as needed for edema. 30 tablet 1  . gabapentin (NEURONTIN) 100 MG capsule Take 1 tablet in the morning and 1 tablet at noon. 60 capsule 0  . gabapentin (NEURONTIN) 300 MG capsule Take 1 capsule (300 mg total) by mouth at bedtime. 90 capsule 1  . loteprednol (LOTEMAX) 0.5 % ophthalmic suspension Place 1 drop into the right eye.     . methylPREDNISolone (MEDROL DOSEPAK) 4 MG TBPK tablet Take as directed 21 tablet 0  . metoprolol succinate (TOPROL XL) 25 MG 24 hr tablet Take 1 tablet (25 mg total) by mouth daily. 90 tablet 3  . mirabegron ER (MYRBETRIQ) 25 MG TB24 tablet Take 1 tablet (25 mg total) by mouth daily. For overactive bladder 90 tablet 1  . prednisoLONE acetate (PRED FORTE) 1 % ophthalmic suspension Place 1 drop into the left eye twice a day    . ranitidine (ZANTAC) 150 MG capsule Take 1 capsule (150 mg total) by mouth 2  (two) times daily. 30 capsule 2  . traMADol (ULTRAM) 50 MG tablet 1 tab po q hs as needed for pain 5 tablet 0   No current facility-administered medications on file prior to visit.     BP (!) 113/51   Pulse 65   Temp 98.6 F (37 C) (Oral)   Ht 5\' 5"  (1.651 m)   Wt 151 lb (68.5 kg)   SpO2 100%   BMI 25.13 kg/m       Objective:   Physical Exam  Constitutional: She is oriented to person, place, and time. She appears well-developed and well-nourished.  Cardiovascular: Normal rate, regular rhythm and normal heart sounds.   No murmur heard. Pulmonary/Chest: Effort normal and breath sounds normal. No respiratory distress. She has no wheezes.  Neurological: She is alert and oriented to person, place, and time.  Bilateral UE strength and hand grasps is 5/5  Psychiatric: She has a normal Trevino and affect. Her behavior is normal. Judgment and thought content normal.          Assessment & Plan:  Cervical radiculopathy- Will rx with IM toradol today. Rx will also be provided for tramadol prn.  I have sent a note to Dr. Letta Pate to see if he can see her in the near future.  Await MRI results on 8/11.  Controlled substance registry is reviewed and is consistent.  Pt to sign controlled substance contract and provide UDS today.  Needs PPD today.

## 2016-11-16 NOTE — Telephone Encounter (Signed)
Could you please call interventional radiology at [facility] and ask them if any of their  radiologists perform cervical epidural injections?

## 2016-11-18 ENCOUNTER — Telehealth: Payer: Self-pay | Admitting: *Deleted

## 2016-11-18 ENCOUNTER — Ambulatory Visit: Payer: Medicare Other | Admitting: *Deleted

## 2016-11-18 DIAGNOSIS — H401133 Primary open-angle glaucoma, bilateral, severe stage: Secondary | ICD-10-CM | POA: Diagnosis not present

## 2016-11-18 DIAGNOSIS — H16401 Unspecified corneal neovascularization, right eye: Secondary | ICD-10-CM | POA: Diagnosis not present

## 2016-11-18 DIAGNOSIS — Z111 Encounter for screening for respiratory tuberculosis: Secondary | ICD-10-CM

## 2016-11-18 DIAGNOSIS — T86840 Corneal transplant rejection: Secondary | ICD-10-CM | POA: Diagnosis not present

## 2016-11-18 LAB — TB SKIN TEST
INDURATION: 0 mm
TB SKIN TEST: NEGATIVE

## 2016-11-18 NOTE — Patient Instructions (Signed)
PPD Reading Note 11/18/16 PPD read and results entered in [facility]. Result: 0.00 mm induration. Interpretation: Negative Allergic reaction: no   Letter printed for [facility] and FL2 paperwork documented as well/SLS 08/09

## 2016-11-18 NOTE — Telephone Encounter (Addendum)
Patient presented to have PPD test read, gave letter for [facility] and sign off on FL2 for negative results. Patient Inquired about Pain Management referral for shoulder; 4-6 weeks currently per [facility]. Pt stated Tramadol was not working for pain, has MRI scheduled Saturday; informed she could take her 325 mg Acetaminophen with the Tramadol upon her asking [as it has none in it]/SLS

## 2016-11-18 NOTE — Telephone Encounter (Signed)
Called Interventional Radiology Dept at [facility] and left a message for call back.

## 2016-11-18 NOTE — Progress Notes (Signed)
PPD Reading Note 11/18/16 PPD read and results entered in [facility]. Result: 0.00 mm induration. Interpretation: Negative Allergic reaction: no   Letter printed for [facility] and FL2 paperwork documented as well/SLS 08/09

## 2016-11-19 ENCOUNTER — Telehealth: Payer: Self-pay | Admitting: *Deleted

## 2016-11-19 ENCOUNTER — Ambulatory Visit: Payer: Medicare Other | Admitting: Family

## 2016-11-19 NOTE — Telephone Encounter (Signed)
Called interventional radiology dept at [phone].  No answer.  Called [phone] and left a message on Jennifer's voice mail.

## 2016-11-19 NOTE — Telephone Encounter (Signed)
Received envelope from PCP labeled flight cancellation form and had copy of PPD result attached. Attempted to contact pt to let her know form and result is available for pick up at the front desk and left message for her to return my call. Forms placed at front desk for pick up.

## 2016-11-20 ENCOUNTER — Ambulatory Visit (HOSPITAL_BASED_OUTPATIENT_CLINIC_OR_DEPARTMENT_OTHER)
Admission: RE | Admit: 2016-11-20 | Discharge: 2016-11-20 | Disposition: A | Discharge status: Home / Self Care | Payer: Medicare Other | Source: Ambulatory Visit | Attending: Family | Admitting: Family

## 2016-11-20 DIAGNOSIS — M4722 Other spondylosis with radiculopathy, cervical region: Secondary | ICD-10-CM | POA: Insufficient documentation

## 2016-11-20 DIAGNOSIS — M4802 Spinal stenosis, cervical region: Secondary | ICD-10-CM | POA: Insufficient documentation

## 2016-11-20 DIAGNOSIS — M5412 Radiculopathy, cervical region: Secondary | ICD-10-CM | POA: Diagnosis not present

## 2016-11-20 DIAGNOSIS — M47812 Spondylosis without myelopathy or radiculopathy, cervical region: Secondary | ICD-10-CM | POA: Diagnosis not present

## 2016-11-21 ENCOUNTER — Telehealth: Payer: Self-pay | Admitting: Family

## 2016-11-21 DIAGNOSIS — M4802 Spinal stenosis, cervical region: Secondary | ICD-10-CM

## 2016-11-21 DIAGNOSIS — M4800 Spinal stenosis, site unspecified: Secondary | ICD-10-CM

## 2016-11-21 MED ORDER — METHYLPREDNISOLONE 4 MG PO TBPK: ORAL_TABLET | ORAL | 0 refills | Status: DC

## 2016-11-21 NOTE — Telephone Encounter (Signed)
Please contact patient and let her know that I reviewed her MRI results. MRI shows that she has some severely pinched nerves in her neck. In addition it appears that there is some pressure on her spinal cord in the neck region. I would like her to see a neurosurgeon as soon as possible. They can determine if she needs surgery or if they can do some less invasive procedure such as the epidural spinal injections. I would also like her to repeat a Medrol Dosepak will wait to get her into see neurosurgery. If she has any weakness in her legs or arms or if she has any incontinence she should go to the emergency department.

## 2016-11-22 ENCOUNTER — Telehealth: Payer: Self-pay | Admitting: Family

## 2016-11-22 MED ORDER — METHYLPREDNISOLONE 4 MG PO TBPK: ORAL_TABLET | ORAL | 0 refills | Status: DC

## 2016-11-22 MED ORDER — HYDROCODONE-ACETAMINOPHEN 5-300 MG PO TABS: 1.0000 | ORAL_TABLET | Freq: Four times a day (QID) | ORAL | 0 refills | Status: DC | PRN

## 2016-11-22 MED FILL — METHYLPREDNISOLONE 4 MG TAB: 4 | 6 days supply | Qty: 21 | Fill #0

## 2016-11-22 MED FILL — HYDROCODON-APAP 5-300: 5-300 | 5 days supply | Qty: 20 | Fill #0

## 2016-11-22 NOTE — Telephone Encounter (Signed)
Left message on pt's voice mail to return my call

## 2016-11-22 NOTE — Telephone Encounter (Signed)
Notified pt. She would like prednisone dose pack to go to [facility]. Rx cancelled at [facility] and sent to [facility]. Hydrocodone rx placed at front desk for pick up.

## 2016-11-22 NOTE — Telephone Encounter (Signed)
See note re: MRI results.

## 2016-11-22 NOTE — Telephone Encounter (Signed)
See phone note re: MRI results

## 2016-11-22 NOTE — Telephone Encounter (Signed)
Caller name:Mark Nemitz Relationship to patient: Can be [phone] Pharmacy:  Reason for call:Patient states she is in a lot of pain, needs something stronger. Please advise

## 2016-11-22 NOTE — Telephone Encounter (Signed)
Call returned.  Per Anderson Malta, they do not do cervical epidural injections at [facility], but instead do them at [facility] @ [phone].

## 2016-11-22 NOTE — Addendum Note (Signed)
Addended by: Kelle Darting A on: 11/22/2016 11:28 AM   Modules accepted: Orders

## 2016-11-22 NOTE — Telephone Encounter (Signed)
Notified pt. 

## 2016-11-22 NOTE — Telephone Encounter (Signed)
D/c tramadol- rx provided for hydrocodone prn.  For short term only.  Also, see discussion below.

## 2016-11-26 ENCOUNTER — Ambulatory Visit (INDEPENDENT_AMBULATORY_CARE_PROVIDER_SITE_OTHER): Payer: Medicare Other | Admitting: Family

## 2016-11-26 ENCOUNTER — Encounter: Payer: Self-pay | Admitting: Family

## 2016-11-26 VITALS — BP 122/65 | HR 58 | Temp 98.9°F | Resp 16 | Ht 65.0 in | Wt 152.6 lb

## 2016-11-26 DIAGNOSIS — M5412 Radiculopathy, cervical region: Secondary | ICD-10-CM

## 2016-11-26 DIAGNOSIS — K1120 Sialoadenitis, unspecified: Secondary | ICD-10-CM

## 2016-11-26 LAB — CBC WITH DIFFERENTIAL/PLATELET
BASOS ABS: 0 10*3/uL (ref 0.0–0.1)
BASOS PCT: 0.3 % (ref 0.0–3.0)
EOS PCT: 1 % (ref 0.0–5.0)
Eosinophils Absolute: 0.1 10*3/uL (ref 0.0–0.7)
HEMATOCRIT: 37.9 % (ref 36.0–46.0)
Hemoglobin: 12.4 g/dL (ref 12.0–15.0)
LYMPHS ABS: 1 10*3/uL (ref 0.7–4.0)
LYMPHS PCT: 10.6 % — AB (ref 12.0–46.0)
MCHC: 32.6 g/dL (ref 30.0–36.0)
MCV: 87.6 fl (ref 78.0–100.0)
MONOS PCT: 6.3 % (ref 3.0–12.0)
Monocytes Absolute: 0.6 10*3/uL (ref 0.1–1.0)
NEUTROS ABS: 7.7 10*3/uL (ref 1.4–7.7)
NEUTROS PCT: 81.8 % — AB (ref 43.0–77.0)
PLATELETS: 235 10*3/uL (ref 150.0–400.0)
RBC: 4.33 Mil/uL (ref 3.87–5.11)
RDW: 17.1 % — AB (ref 11.5–15.5)
WBC: 9.5 10*3/uL (ref 4.0–10.5)

## 2016-11-26 MED ORDER — AMOXICILLIN-POT CLAVULANATE 875-125 MG PO TABS: 1.0000 | ORAL_TABLET | Freq: Two times a day (BID) | ORAL | 0 refills | Status: DC

## 2016-11-26 MED FILL — AMOX-CLAV 875-125 MG TABLET: 875-125 | 10 days supply | Qty: 20 | Fill #0

## 2016-11-26 NOTE — Patient Instructions (Signed)
Please keep your upcoming appointment with neurosurgery on August 21. Please complete the Medrol Dosepak. Please go to the emergency department if you develop bowel or bladder incontinence or weakness in the arms or legs. Please begin Augmentin for the swollen gland on your neck. Call if you develop increased pain swelling or fever.

## 2016-11-26 NOTE — Progress Notes (Signed)
Subjective:    Patient ID: Mary Trevino, female    DOB: Mar 02, 1927, 83 y.o.   MRN: [id number]  HPI  Ms. Mary Trevino is an 83 yr old female who presents today for follow up.  Pt was seen on 11/16/16 with c/o cervical neck pain. Since that time she has complete an MRI of the c-spine which noted the following:  IMPRESSION: 1. Multilevel spondylosis of the cervical spine as described. 2. Moderate right foraminal narrowing at C2-3 and C3-4. 3. Severe left and moderate right foraminal stenosis C4-5 and C5-6. 4. Moderate central canal stenosis at C4-5 and C5-6 with some distortion of the cord but no abnormal cord signal. 5. Moderate foraminal narrowing bilaterally at C6-7 is worse on the left. 6. Moderate left and mild right foraminal narrowing at C7-T1.  Due to these findings, a referral was made to neurosurgery and she was given an rx for a medrol dose pak.    She has an appointment on 8/21 with Dr. [name] Roan (neurosurgery).  Denies bowel/bladder incontinence. Notes some improvement in her pain with the medrol dose pak and  hydrocodone.   Notes swelling left side of neck yesterday evening.  Area is tender.   Review of Systems See HPI  Past Medical History:  Diagnosis Date  . Glaucoma   . Headache   . Hyperlipidemia   . Hypertension   . Subarachnoid hemorrhage ([place]) 12/2015     Social History   Social History  . Marital status: Widowed    Spouse name: N/A  . Number of children: 3  . Years of education: HS   Occupational History  . [employer]ied    Social History Main Topics  . Smoking status: Former Smoker    Years: 10.00  . Smokeless tobacco: Never Used  . Alcohol use No  . Drug use: No  . Sexual activity: Not on file   Other Topics Concern  . 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   Right-handed.   No caffeine use.    Past Surgical History:  Procedure Laterality Date  . ABDOMINAL HYSTERECTOMY  1975  . CORNEAL TRANSPLANT     2016 (left) 2014 (right)     Family History  Problem Relation Age of Onset  . Hypertension Mother        died of cardiac arrest age 57  . Heart attack Mother   . Other Father        natural causes  . Colon cancer Daughter        died age 9    No Known Allergies  Current Outpatient Prescriptions on File Prior to Visit  Medication Sig Dispense Refill  . acetaminophen (TYLENOL) 325 MG tablet Take 650 mg by mouth every 4 (four) hours as needed.    [name] atorvastatin (LIPITOR) 10 MG tablet TAKE 1 TABLET DAILY 90 tablet 1  . bimatoprost (LUMIGAN) 0.01 % SOLN Place 1 drop into the right eye nightly    . brimonidine-timolol (COMBIGAN) 0.2-0.5 % ophthalmic solution Place 1 drop into both eyes twice a day    . diclofenac (CATAFLAM) 50 MG tablet Take 1 tablet (50 mg total) by mouth 2 (two) times daily. 14 tablet 0  . dorzolamide (TRUSOPT) 2 % ophthalmic solution  Place 1 drop into the right eye daily    . furosemide (LASIX) 20 MG tablet Take 1 tablet (20 mg total) by mouth daily as needed for edema. 30 tablet 1  . gabapentin (NEURONTIN) 100 MG capsule Take 1 tablet in the morning and 1 tablet at noon. 60 capsule 0  . gabapentin (NEURONTIN) 300 MG capsule Take 1 capsule (300 mg total) by mouth at bedtime. 90 capsule 1  . Hydrocodone-Acetaminophen 5-300 MG TABS Take 1 tablet by mouth every 6 (six) hours as needed. 20 each 0  . loteprednol (LOTEMAX) 0.5 % ophthalmic suspension Place 1 drop into the right eye.     . methylPREDNISolone (MEDROL DOSEPAK) 4 MG TBPK tablet Take as directed 21 tablet 0  . metoprolol succinate (TOPROL XL) 25 MG 24 hr tablet Take 1 tablet (25 mg total) by mouth daily. 90 tablet 3  . mirabegron ER (MYRBETRIQ) 25 MG TB24 tablet Take 1 tablet (25 mg total) by mouth daily. For overactive bladder 90 tablet 1  .  prednisoLONE acetate (PRED FORTE) 1 % ophthalmic suspension Place 1 drop into the left eye twice a day    . ranitidine (ZANTAC) 150 MG capsule Take 1 capsule (150 mg total) by mouth 2 (two) times daily. 30 capsule 2   No current facility-administered medications on file prior to visit.     BP 122/65 (BP Location: Right Arm, Cuff Size: Normal)   Pulse (!) 58   Temp 98.9 F (37.2 C) (Oral)   Resp 16   Ht 5\' 5"  (1.651 m)   Wt 152 lb 9.6 oz (69.2 kg)   SpO2 100%   BMI 25.39 kg/m       Objective:   Physical Exam  Constitutional: She appears well-developed and well-nourished.  Neck:  Swollen/tender left parotid gland  Cardiovascular: Normal rate, regular rhythm and normal heart sounds.   No murmur heard. Pulmonary/Chest: Effort normal and breath sounds normal. No respiratory distress. She has no wheezes.  Musculoskeletal:  1-2+ bilateral LE edema  Psychiatric: She has a normal Trevino and affect. Her behavior is normal. Judgment and thought content normal.          Assessment & Plan:  Parotitis-Will prescribe Augmentin. She is advised to let me know if new/worsening pain swelling or fever. Will reevaluate in 10 days.  Cervical radiculopathy-MRI notes severe spinal stenosis as well as some sign of cord compression. She is completing her Medrol Dosepak. Having some improvement in her pain. She is advised on red flags that should prompt ER visit. She is also advised to keep her upcoming appointment with neurosurgery.

## 2016-11-26 NOTE — Addendum Note (Signed)
Addended by: Debbrah Alar on: 11/26/2016 10:26 AM   Modules accepted: Orders

## 2016-11-29 ENCOUNTER — Telehealth: Payer: Self-pay | Admitting: Family

## 2016-11-29 MED ORDER — CLINDAMYCIN HCL 300 MG PO CAPS: 600.0000 mg | ORAL_CAPSULE | Freq: Three times a day (TID) | ORAL | 0 refills | Status: DC

## 2016-11-29 NOTE — Telephone Encounter (Signed)
Notified pt and she voices understanding. 

## 2016-11-29 NOTE — Telephone Encounter (Signed)
Caller name: [name] Relationship to patient: self Can be reached: [phone] Pharmacy: [facility], [place] - [address] AT [address]  Reason for call: Pt called stating ABX from Friday 11/26/16 have given her diarrhea and she cannot continue to take. Please f/u with pt.

## 2016-11-29 NOTE — Telephone Encounter (Signed)
D/c augmentin, start clindamycin. Call if diarrhea worsens or does not improve.

## 2016-11-30 DIAGNOSIS — M542 Cervicalgia: Secondary | ICD-10-CM | POA: Diagnosis not present

## 2016-11-30 DIAGNOSIS — M5412 Radiculopathy, cervical region: Secondary | ICD-10-CM | POA: Diagnosis not present

## 2016-11-30 DIAGNOSIS — M4722 Other spondylosis with radiculopathy, cervical region: Secondary | ICD-10-CM | POA: Diagnosis not present

## 2016-11-30 DIAGNOSIS — M5030 Other cervical disc degeneration, unspecified cervical region: Secondary | ICD-10-CM | POA: Diagnosis not present

## 2016-11-30 DIAGNOSIS — M9981 Other biomechanical lesions of cervical region: Secondary | ICD-10-CM | POA: Diagnosis not present

## 2016-12-02 ENCOUNTER — Other Ambulatory Visit: Payer: Self-pay | Admitting: Neurosurgery

## 2016-12-02 DIAGNOSIS — M5412 Radiculopathy, cervical region: Secondary | ICD-10-CM

## 2016-12-03 ENCOUNTER — Ambulatory Visit (HOSPITAL_BASED_OUTPATIENT_CLINIC_OR_DEPARTMENT_OTHER): Payer: Medicare Other | Admitting: Physical Medicine & Rehabilitation

## 2016-12-03 ENCOUNTER — Encounter: Payer: Self-pay | Admitting: Physical Medicine & Rehabilitation

## 2016-12-03 ENCOUNTER — Encounter: Payer: Medicare Other | Attending: Physical Medicine & Rehabilitation

## 2016-12-03 VITALS — BP 110/68 | HR 68 | Resp 14

## 2016-12-03 DIAGNOSIS — M7918 Myalgia, other site: Secondary | ICD-10-CM | POA: Insufficient documentation

## 2016-12-03 DIAGNOSIS — M4802 Spinal stenosis, cervical region: Secondary | ICD-10-CM | POA: Diagnosis not present

## 2016-12-03 DIAGNOSIS — M791 Myalgia: Secondary | ICD-10-CM | POA: Diagnosis not present

## 2016-12-03 DIAGNOSIS — M47812 Spondylosis without myelopathy or radiculopathy, cervical region: Secondary | ICD-10-CM | POA: Diagnosis not present

## 2016-12-03 NOTE — Patient Instructions (Signed)
Dr. [name] Roan. The neurosurgeon has already ordered a cervical epidural as well as physical therapy. He should be getting a call from those offices in the near future. If he do not hear from them call Dr. Oralia Rud office.  I have done trigger point injections today, and this may help relax some of the muscle spasm in your neck

## 2016-12-03 NOTE — Progress Notes (Signed)
Subjective:     Patient ID: Mary Trevino, female   DOB: 06-Jun-1926, 83 y.o.   MRN: [id number]  HPI 83 year old female with a chronic history of neck pain over the last several years. When she lived in [place]. She went to a pain clinic where they performed cervical epidural steroid injections. Her last injection was in 2016.  Patient was doing well up until about one month ago. She was seen by her primary physician and referred to this clinic.  Patient was tried on tramadol and now was switched to hydrocodone. Patient states the tramadol was not helpful. She finds the hydrocodone to be more helpful. Has constipation . Patient is not sure if this is worse on the pain medication  Patient had physical therapy several years ago and found this to be helpful.         Pain Inventory Average Pain 10 Pain Right Now 8 My pain is constant and sharp  In the last 24 hours, has pain interfered with the following? General activity 7 Relation with others 7 Enjoyment of life 7 What TIME of day is your pain at its worst? morning Sleep (in general) Poor  Pain is worse with: sitting Pain improves with: heat/ice and medication Relief from Meds: 5  Mobility walk without assistance ability to climb steps?  yes do you drive?  no Do you have any goals in this area?  no  Function retired  Neuro/Psych weakness numbness  Prior Studies new visit CLINICAL DATA:  Neck and upper thoracic pain for 3 weeks. Pain extends into the left shoulder and upper extremity. Gradual progression. Cervical radicular pain.  EXAM: MRI CERVICAL SPINE WITHOUT CONTRAST  TECHNIQUE: Multiplanar, multisequence MR imaging of the cervical spine was performed. No intravenous contrast was administered.  COMPARISON:  Cervical spine radiographs 11/02/2016. CT of the cervical spine 12/18/2015  FINDINGS: Alignment: Slight degenerative anterolisthesis is again noted at C3-4 and C4-5. AP alignment is otherwise  anatomic.  Vertebrae: Chronic endplate marrow changes are noted at C5-6. A superior endplate Schmorl's node at T1 is stable. The  Cord: Normal signal is present in the cervical and upper thoracic spinal cord to the lowest imaged level, T1-2.  Posterior Fossa, vertebral arteries, paraspinal tissues: The craniocervical junction is normal. Flow is present in the vertebral arteries bilaterally. The paraspinous soft tissues are within normal limits.  Disc levels:  C2-3: Asymmetric right-sided uncovertebral and facet hypertrophy contributes to moderate right foraminal stenosis.  C3-4: A broad-based disc osteophyte complex effaces the ventral CSF. Moderate foraminal narrowing is worse on the right.  C4-5: A broad-based disc osteophyte complex is asymmetric to the left. There is effacement of ventral CSF. Asymmetric left-sided facet hypertrophy and uncovertebral spurring contribute to severe left and moderate right foraminal narrowing.  C5-6: A broad-based disc osteophyte complex effaces the ventral CSF and distorts the ventral surface of the cord. The canal is narrowed to 7 mm. Severe left and moderate right foraminal stenosis is present.  C6-7: Uncovertebral spurring leads to moderate foraminal narrowing bilaterally, left greater than right.  C7-T1: Asymmetric severe left-sided facet hypertrophy and mild uncovertebral disease leads to moderate left and mild right foraminal narrowing.  IMPRESSION: 1. Multilevel spondylosis of the cervical spine as described. 2. Moderate right foraminal narrowing at C2-3 and C3-4. 3. Severe left and moderate right foraminal stenosis C4-5 and C5-6. 4. Moderate central canal stenosis at C4-5 and C5-6 with some distortion of the cord but no abnormal cord signal. 5. Moderate foraminal narrowing bilaterally at C6-7  is worse on the left. 6. Moderate left and mild right foraminal narrowing at C7-T1.   Electronically Signed   By:  San Morelle M.D.   On: 11/20/2016 14:24 Physicians involved in your care new visit   Family History  Problem Relation Age of Onset  . Hypertension Mother        died of cardiac arrest age 25  . Heart attack Mother   . Other Father        natural causes  . Colon cancer Daughter        died age 35   Social History   Social History  . Marital status: Widowed    Spouse name: N/A  . Number of children: 3  . Years of education: HS   Occupational History  . [employer]    Social History Main Topics  . Smoking status: Former Smoker    Years: 10.00  . Smokeless tobacco: Never Used  . Alcohol use No  . Drug use: No  . Sexual activity: Not Asked   Other Topics Concern  . 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   No caffeine use.   Past Surgical History:  Procedure Laterality Date  . ABDOMINAL HYSTERECTOMY  1975  . CORNEAL TRANSPLANT     2016 (left) 2014 (right)    Past Medical History:  Diagnosis Date  . Glaucoma   . Headache   . Hyperlipidemia   . Hypertension   . Subarachnoid hemorrhage ([place]) 12/2015   BP 110/68 (BP Location: Right Arm, Patient Position: Sitting, Cuff Size: Normal)   Pulse 68   Resp 14   SpO2 98%   Opioid Risk Score:   Fall Risk Score:  `1  Depression screen PHQ 2/9  Depression screen [facility] 2/9 09/21/2016 06/21/2016 03/23/2016 10/23/2015  Decreased Interest 1 0 0 0  Down, Depressed, Hopeless 3 0 0 0  PHQ - 2 Score 4 0 0 0  Altered sleeping 1 - - -  Tired, decreased energy 1 - - -  Change in appetite 0 - - -  Feeling bad or failure about yourself  3 - - -  Trouble concentrating 0 - - -  Moving slowly or fidgety/restless 0 - - -  Suicidal thoughts 0 - - -  PHQ-9 Score 9 - - -    Review of Systems  HENT: Negative.    Eyes: Positive for visual disturbance.  Respiratory: Negative.   Cardiovascular: Negative.   Endocrine: Negative.   Genitourinary: Negative.   Musculoskeletal: Positive for arthralgias, neck pain and neck stiffness.  Skin: Negative.   Allergic/Immunologic: Negative.   Neurological: Positive for weakness and numbness.  Hematological: Negative.   Psychiatric/Behavioral: Negative.        Objective:   Physical Exam  Constitutional: She is oriented to person, place, and time. She appears well-developed and well-nourished. No distress.  HENT:  Head: Normocephalic and atraumatic.  Eyes: Pupils are equal, round, and reactive to light. Conjunctivae and EOM are normal.  Neck: Normal range of motion. No JVD present.  Cardiovascular: Normal rate, regular rhythm and normal heart sounds.  Exam reveals no friction rub.   No murmur heard. Pulmonary/Chest: Effort normal and breath sounds normal. No stridor. No respiratory distress.  Abdominal: Soft. Bowel sounds are normal. She exhibits no distension. There is no tenderness.  Neurological: She is alert and oriented to person, place, and time.  Skin: Skin is warm and dry. She is not diaphoretic.  Psychiatric: She has a normal mood and affect.  Nursing note and vitals reviewed.  Cervical range of motion is limited to 25% flexion, extension, lateral bending and rotation related to pain.  Sensation is reduced bilateral C5 bilateral C7 bilateral C 8 But normal bilateral C6     Assessment:     1. Cervical spinal stenosis with clinical symptoms of left C5 radiculopathy. She has no clear cut localizing neurologic signs. Since she has some diffuse decreased pinprick bilateral C5, C7, C8. No motor deficits, no bowel or bladder issues.  Patient reports having a urine drug screen at her primary care office and has been prescribed hydrocodone, therefore, will not prescribe additional. In either case, she is not a good candidate for long-term opiates.  Given her advanced age and fall risk due to severe visual impairment  2. Patient has myofascial pain, which is associated with above. She may benefit from trigger point injections    Plan:     Trigger Point Injection  Indication: Cervical  Myofascial pain not relieved by medication management and other conservative care.  Informed consent was obtained after describing risk and benefits of the procedure with the patient, this includes bleeding, bruising, infection and medication side effects.  The patient wishes to proceed and has given written consent.  The patient was placed in a seting position.  The Left trap x 2 , levator scap x 2 and Left post scalene x 1 , left semispinalis capitus area was marked and prepped with Betadine.  It was entered with a 25-gauge 1-1/2 inch needle and 1 mL of 1% lidocaine was injected into each of 6 trigger points, after negative draw back for blood.  The patient tolerated the procedure well.  Post procedure instructions were given.  Agree with gabapentin as prescribed. Agree with nonsteroidal anti-inflammatory limited course approximate 2 weeks. Agree with the  cervical epidural and physical therapy ordered by neurosurgery, Dr. [name] Roan  Do not think she will need long-term narcotic analgesics  Trigger Point Injection  Indication: Cervical Myofascial pain not relieved by medication management and other conservative care.  Informed consent was obtained after describing risk and benefits of the procedure with the patient, this includes bleeding, bruising, infection and medication side effects.  The patient wishes to proceed and has given written consent.  The patient was placed in a Seated position.  The Upper trap and levator scap area was marked and prepped with Betadine.  It was entered with a 25-gauge 1-1/2 inch needle and 1 mL of 1% lidocaine was injected into each of 6 trigger points, after negative draw back for blood.  The patient tolerated the procedure  well.  Post procedure instructions were given.

## 2016-12-06 ENCOUNTER — Ambulatory Visit (INDEPENDENT_AMBULATORY_CARE_PROVIDER_SITE_OTHER): Payer: Medicare Other | Admitting: Family

## 2016-12-06 ENCOUNTER — Encounter: Payer: Self-pay | Admitting: Family

## 2016-12-06 ENCOUNTER — Telehealth: Payer: Self-pay | Admitting: Family

## 2016-12-06 VITALS — BP 119/62 | HR 58 | Temp 98.5°F | Resp 18 | Ht 65.0 in | Wt 154.2 lb

## 2016-12-06 DIAGNOSIS — M5412 Radiculopathy, cervical region: Secondary | ICD-10-CM | POA: Diagnosis not present

## 2016-12-06 DIAGNOSIS — K1120 Sialoadenitis, unspecified: Secondary | ICD-10-CM

## 2016-12-06 MED ORDER — TRAMADOL HCL 50 MG PO TABS: 50.0000 mg | ORAL_TABLET | Freq: Two times a day (BID) | ORAL | 0 refills | Status: DC | PRN

## 2016-12-06 MED ORDER — LIDOCAINE 5 % EX PTCH: 1.0000 | MEDICATED_PATCH | CUTANEOUS | 5 refills | Status: DC

## 2016-12-06 MED ORDER — GABAPENTIN 300 MG PO CAPS: 300.0000 mg | ORAL_CAPSULE | Freq: Every day | ORAL | 1 refills | Status: DC

## 2016-12-06 MED FILL — traMADol HCL 50 MG TABS: 50 | 30 days supply | Qty: 60 | Fill #0

## 2016-12-06 NOTE — Patient Instructions (Signed)
Please increase gabapentin to 300mg  three times daily. You may use tramadol twice daily as needed for pain. You may apply lidoderm patch to neck 12 hours a day.

## 2016-12-06 NOTE — Telephone Encounter (Signed)
Pt said that she called pharmacy and they have not received Rx for Gabapentin. She would like further assistance.

## 2016-12-06 NOTE — Progress Notes (Signed)
Subjective:    Patient ID: Mary Trevino, female    DOB: July 22, 1926, 83 y.o.   MRN: [id number]  HPI   Mary Trevino is an 83 year old female who presents today for follow-up.  Parotitis-last visit she was noted to have enlarged gland on left side of neck. She was initially started on Augmentin but was unable to tolerate due to diarrhea. Her Augmentin was changed to clindamycin.She reports that she tolerated the clindamycin much better and that her swollen gland is improved.   Cervical radiculopathy-since her last visit she has met with neurosurgery Dr. [name] Roan. She reviewed her MRI. She recommended epidural cervical and thoracic injections as well as a referral to physical therapy. She also met with pain management, Dr. Letta Pate. He recommended trigger point injection for her cervical myofascial pain. He also agreed with gabapentin as prescribed and a limited course of anti-inflammatory. She reports that the injections that Dr. Letta Pate gave her did not help her.    Review of Systems    see HPI Past Medical History:  Diagnosis Date  . Glaucoma   . Headache   . Hyperlipidemia   . Hypertension   . Subarachnoid hemorrhage ([place]) 12/2015     Social History   Social History  . Marital status: Widowed    Spouse name: N/A  . Number of children: 3  . Years of education: HS   Occupational History  . [employer]ied    Social History Main Topics  . Smoking status: Former Smoker    Years: 10.00  . Smokeless tobacco: Never Used  . Alcohol use No  . Drug use: No  . Sexual activity: Not on file   Other Topics Concern  . 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   No caffeine use.    Past Surgical History:  Procedure Laterality Date  . ABDOMINAL HYSTERECTOMY  1975    . CORNEAL TRANSPLANT     2016 (left) 2014 (right)     Family History  Problem Relation Age of Onset  . Hypertension Mother        died of cardiac arrest age 58  . Heart attack Mother   . Other Father        natural causes  . Colon cancer Daughter        died age 53    Allergies  Allergen Reactions  . Augmentin [Amoxicillin-Pot Clavulanate] Diarrhea    Current Outpatient Prescriptions on File Prior to Visit  Medication Sig Dispense Refill  . acetaminophen (TYLENOL) 325 MG tablet Take 650 mg by mouth every 4 (four) hours as needed.    [name] atorvastatin (LIPITOR) 10 MG tablet TAKE 1 TABLET DAILY 90 tablet 1  . brimonidine-timolol (COMBIGAN) 0.2-0.5 % ophthalmic solution Place 1 drop into both eyes twice a day    . clindamycin (CLEOCIN) 300 MG capsule Take 2 capsules (600 mg total) by mouth 3 (three) times daily. 42 capsule 0  . diclofenac (CATAFLAM) 50 MG tablet Take 1 tablet (50 mg total) by mouth 2 (two) times daily. 14 tablet 0  . furosemide (LASIX) 20 MG tablet Take 1 tablet (20 mg total) by mouth daily as needed for edema. 30 tablet 1  . gabapentin (NEURONTIN) 100 MG capsule Take  1 tablet in the morning and 1 tablet at noon. 60 capsule 0  . gabapentin (NEURONTIN) 300 MG capsule Take 1 capsule (300 mg total) by mouth at bedtime. 90 capsule 1  . Hydrocodone-Acetaminophen 5-300 MG TABS Take 1 tablet by mouth every 6 (six) hours as needed. 20 each 0  . loteprednol (LOTEMAX) 0.5 % ophthalmic suspension Place 1 drop into the right eye.     . metoprolol succinate (TOPROL XL) 25 MG 24 hr tablet Take 1 tablet (25 mg total) by mouth daily. 90 tablet 3  . mirabegron ER (MYRBETRIQ) 25 MG TB24 tablet Take 1 tablet (25 mg total) by mouth daily. For overactive bladder 90 tablet 1  . prednisoLONE acetate (PRED FORTE) 1 % ophthalmic suspension Place 1 drop into the left eye twice a day    . ranitidine (ZANTAC) 150 MG capsule Take 1 capsule (150 mg total) by mouth 2 (two) times daily. 30 capsule 2    No current facility-administered medications on file prior to visit.     BP 119/62 (BP Location: Right Arm, Cuff Size: Normal)   Pulse (!) 58   Temp 98.5 F (36.9 C) (Oral)   Resp 18   Ht '5\' 5"'$  (1.651 m)   Wt 154 lb 3.2 oz (69.9 kg)   SpO2 100%   BMI 25.66 kg/m    Objective:   Physical Exam  Constitutional: She is oriented to person, place, and time. She appears well-developed and well-nourished.  HENT:  Head: Normocephalic and atraumatic.  Neck:  Resolution of enlargement left neck  Cardiovascular: Normal rate, regular rhythm and normal heart sounds.   No murmur heard. Pulmonary/Chest: Effort normal and breath sounds normal. No respiratory distress. She has no wheezes.  Musculoskeletal: She exhibits no edema.  Neurological: She is alert and oriented to person, place, and time.  Psychiatric: She has a normal Trevino and affect. Her behavior is normal. Judgment and thought content normal.          Assessment & Plan:  Cervical radiculopathy- unchanged. She is requesting hydrocodone rx.  I advised patient that pain management and neurosurgery did not feel that she was a good candidate for long-term narcotic use. Advised random not comfortable continuing to prescribe long-term hydrocodone for her. Advised her I would give her a refill on the tramadol though she states that this "did not help." She reports frustration about this. We discussed the risks of long-term opiate use. She reports that she did use a Lidoderm patch in the past which helped. Advised patient I would send a prescription for this to her pharmacy for her to try. Unfortunately, I have had trouble getting Medicare to cover this prescription most recently. Advised patient this is not covered she could try using over-the-counter salon pas patches. Will also increase her gabapentin dosing. She is currently taking 100 in the morning 100 the afternoon 300 and bedtime. I have increased this to 303 times daily. Advised her to  follow through with her epidural steroid injections which will be scheduled for September after she returns from the assisted living when her daughter will be out of town.  Parotitis- resolution of swelling noted today on exam. Monitor

## 2016-12-06 NOTE — Telephone Encounter (Signed)
Confirmed with pt that Rx should go to [facility] and daughter will pick rx up tomorrow. Rx re-sent and confirmed with Opal Sidles that Rx should be filled for pt today.

## 2016-12-07 MED ORDER — GABAPENTIN 300 MG PO CAPS: 300.0000 mg | ORAL_CAPSULE | Freq: Three times a day (TID) | ORAL | 1 refills | Status: DC

## 2016-12-07 MED FILL — GABAPENTIN 300 MG CAPSULE: 300 | 30 days supply | Qty: 90 | Fill #0

## 2016-12-07 NOTE — Addendum Note (Signed)
Addended by: Kelle Darting A on: 12/07/2016 08:29 AM   Modules accepted: Orders

## 2016-12-07 NOTE — Telephone Encounter (Signed)
Pt's daughter walked in stating gabapentin Rx sent yesterday has wrong directions on it. Was supposed to say 3 times a day. Rx re-sent.

## 2016-12-15 ENCOUNTER — Telehealth: Payer: Self-pay | Admitting: *Deleted

## 2016-12-15 ENCOUNTER — Telehealth: Payer: Self-pay | Admitting: Family

## 2016-12-15 MED ORDER — TRAMADOL HCL 50 MG PO TABS: 50.0000 mg | ORAL_TABLET | Freq: Three times a day (TID) | ORAL | 0 refills | Status: DC | PRN

## 2016-12-15 NOTE — Telephone Encounter (Addendum)
Received Physician Orders from [facility] [x2]; forwarded to provider/SLS 09/05

## 2016-12-15 NOTE — Telephone Encounter (Signed)
OK to increase to TID until her injection next week, then hopefully she switch over to tylenol.

## 2016-12-15 NOTE — Telephone Encounter (Signed)
Relation to pt: self  Call back number: [phone]  Pharmacy:  Reason for call:  Patent would like to take her tramadol 3x daily due to shoulder and back pain, patient states she's not scheduled for pain injection until next week, please advise

## 2016-12-15 NOTE — Telephone Encounter (Signed)
Notified pt and she voices understanding. 

## 2016-12-22 ENCOUNTER — Ambulatory Visit: Payer: Medicare Other | Admitting: Family

## 2016-12-23 DIAGNOSIS — M5412 Radiculopathy, cervical region: Secondary | ICD-10-CM | POA: Diagnosis not present

## 2016-12-27 DIAGNOSIS — M5412 Radiculopathy, cervical region: Secondary | ICD-10-CM | POA: Diagnosis not present

## 2016-12-29 ENCOUNTER — Inpatient Hospital Stay
Admission: RE | Admit: 2016-12-29 | Discharge: 2016-12-29 | Disposition: A | Discharge status: Home / Self Care | Payer: Medicare Other | Source: Ambulatory Visit | Attending: Neurosurgery | Admitting: Neurosurgery

## 2016-12-29 ENCOUNTER — Ambulatory Visit
Admission: RE | Admit: 2016-12-29 | Discharge: 2016-12-29 | Disposition: A | Discharge status: Home / Self Care | Payer: Medicare Other | Source: Ambulatory Visit | Attending: Neurosurgery | Admitting: Neurosurgery

## 2016-12-29 DIAGNOSIS — M5412 Radiculopathy, cervical region: Secondary | ICD-10-CM

## 2016-12-29 DIAGNOSIS — M47812 Spondylosis without myelopathy or radiculopathy, cervical region: Secondary | ICD-10-CM | POA: Diagnosis not present

## 2016-12-29 MED ORDER — TRIAMCINOLONE ACETONIDE 40 MG/ML IJ SUSP (RADIOLOGY)
60.0000 mg | Freq: Once | INTRAMUSCULAR | Status: AC
  Administered 2016-12-29: 60 mg via EPIDURAL

## 2016-12-29 MED ORDER — IOPAMIDOL (ISOVUE-M 300) INJECTION 61%
1.0000 mL | Freq: Once | INTRAMUSCULAR | Status: AC | PRN
  Administered 2016-12-29: 1 mL via EPIDURAL

## 2016-12-29 NOTE — Discharge Instructions (Signed)

## 2016-12-30 ENCOUNTER — Ambulatory Visit: Payer: Medicare Other | Admitting: Physical Medicine & Rehabilitation

## 2017-01-11 ENCOUNTER — Encounter: Payer: Self-pay | Admitting: Family

## 2017-01-11 ENCOUNTER — Ambulatory Visit (INDEPENDENT_AMBULATORY_CARE_PROVIDER_SITE_OTHER): Payer: Medicare Other | Admitting: Family

## 2017-01-11 VITALS — BP 120/72 | HR 71 | Temp 98.6°F | Resp 16 | Ht 65.0 in | Wt 151.4 lb

## 2017-01-11 DIAGNOSIS — Z23 Encounter for immunization: Secondary | ICD-10-CM | POA: Diagnosis not present

## 2017-01-11 DIAGNOSIS — M5412 Radiculopathy, cervical region: Secondary | ICD-10-CM | POA: Diagnosis not present

## 2017-01-11 MED ORDER — PREGABALIN 75 MG PO CAPS: 75.0000 mg | ORAL_CAPSULE | Freq: Two times a day (BID) | ORAL | 0 refills | Status: DC

## 2017-01-11 MED FILL — LYRICA 75 MG CAPSULE: 75 | 30 days supply | Qty: 60 | Fill #0

## 2017-01-11 NOTE — Patient Instructions (Addendum)
Please stop tramadol start Lyrica twice daily for your neck pain. We will work on getting you back in with Dr. Oralia Rud group.

## 2017-01-11 NOTE — Addendum Note (Signed)
Addended by: Kelle Darting A on: 01/11/2017 05:09 PM   Modules accepted: Orders

## 2017-01-11 NOTE — Progress Notes (Signed)
Subjective:    Patient ID: Mary Trevino, female    DOB: 03-17-1927, 83 y.o.   MRN: [id number]  HPI  Ms.  Trevino is an 83 yr old female With history of cervical radiculopathy who presents today with chief complaint of pain radiating into the shoulder. She is followed by neurosurgery, Dr. [name] Roan. Her recommendations were epidural cervical and thoracic injections as well as a referral to physical therapy. She also met with pain management, Dr. Barbaraann Cao. He recommended trigger point injection for her cervical myofascial pain. He also agreed with treatment with gabapentin.  Reports ESI 9/19- reports pain is unbearable x 5 days.  Reports pain radiating into the left shoulder.  She reports that Dr. [name] Roan is no longer with the group.  Had cervical ESI with [facility].  Pain radiates down the left shoulder to the mid arm.  She continues gabapentin. She is out of tramadol.    Review of Systems See HPI  Past Medical History:  Diagnosis Date  . Glaucoma   . Headache   . Hyperlipidemia   . Hypertension   . Subarachnoid hemorrhage ([place]) 12/2015     Social History   Social History  . Marital status: Widowed    Spouse name: N/A  . Number of children: 3  . Years of education: HS   Occupational History  . Retied    Social History Main Topics  . Smoking status: Former Smoker    Years: 10.00  . Smokeless tobacco: Never Used  . Alcohol use No  . Drug use: No  . Sexual activity: Not on file   Other Topics Concern  . 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   No caffeine use.    Past Surgical History:  Procedure Laterality Date  . ABDOMINAL HYSTERECTOMY  1975  . CORNEAL TRANSPLANT     2016 (left) 2014 (right)     Family History  Problem Relation Age of  Onset  . Hypertension Mother        died of cardiac arrest age 75  . Heart attack Mother   . Other Father        natural causes  . Colon cancer Daughter        died age 57    Allergies  Allergen Reactions  . Augmentin [Amoxicillin-Pot Clavulanate] Diarrhea    Current Outpatient Prescriptions on File Prior to Visit  Medication Sig Dispense Refill  . acetaminophen (TYLENOL) 325 MG tablet Take 650 mg by mouth every 4 (four) hours as needed.    [name] atorvastatin (LIPITOR) 10 MG tablet TAKE 1 TABLET DAILY 90 tablet 1  . brimonidine-timolol (COMBIGAN) 0.2-0.5 % ophthalmic solution Place 1 drop into both eyes twice a day    . clindamycin (CLEOCIN) 300 MG capsule Take 2 capsules (600 mg total) by mouth 3 (three) times daily. 42 capsule 0  . diclofenac (CATAFLAM) 50 MG tablet Take 1 tablet (50 mg total) by mouth 2 (two) times daily. 14 tablet 0  . furosemide (LASIX) 20 MG tablet Take 1 tablet (20 mg total) by mouth daily as needed for edema. 30 tablet 1  . gabapentin (NEURONTIN) 300 MG capsule Take 1 capsule (300 mg total) by mouth 3 (three) times daily.  90 capsule 1  . Hydrocodone-Acetaminophen 5-300 MG TABS Take 1 tablet by mouth every 6 (six) hours as needed. 20 each 0  . lidocaine (LIDODERM) 5 % Place 1 patch onto the skin daily. Remove & Discard patch within 12 hours or as directed by MD 30 patch 5  . loteprednol (LOTEMAX) 0.5 % ophthalmic suspension Place 1 drop into the right eye.     . metoprolol succinate (TOPROL XL) 25 MG 24 hr tablet Take 1 tablet (25 mg total) by mouth daily. 90 tablet 3  . mirabegron ER (MYRBETRIQ) 25 MG TB24 tablet Take 1 tablet (25 mg total) by mouth daily. For overactive bladder 90 tablet 1  . prednisoLONE acetate (PRED FORTE) 1 % ophthalmic suspension Place 1 drop into the left eye twice a day    . ranitidine (ZANTAC) 150 MG capsule Take 1 capsule (150 mg total) by mouth 2 (two) times daily. 30 capsule 2  . traMADol (ULTRAM) 50 MG tablet Take 1 tablet (50 mg  total) by mouth every 8 (eight) hours as needed. 60 tablet 0   No current facility-administered medications on file prior to visit.     BP 120/72 (BP Location: Right Arm, Cuff Size: Normal)   Pulse 71   Temp 98.6 F (37 C) (Oral)   Resp 16   Ht '5\' 5"'$  (1.651 m)   Wt 151 lb 6.4 oz (68.7 kg)   SpO2 100%   BMI 25.19 kg/m       Objective:   Physical Exam  Constitutional: She appears well-developed and well-nourished.  Uncomfortable appearing elderly female.  Cardiovascular: Normal rate, regular rhythm and normal heart sounds.   No murmur heard. Pulmonary/Chest: Effort normal and breath sounds normal. No respiratory distress. She has no wheezes.  Musculoskeletal: She exhibits no edema.  Neurological: She is alert.  LUE strength 5/5, RUE strength 4-5/5 Bilateral LE strength is 5/5  Psychiatric: She has a normal mood and affect. Her behavior is normal. Judgment and thought content normal.          Assessment & Plan:  Cervical radiculopathy-symptoms have deteriorated. She is having receiving pain. Will give patient a trial of Lyrica in addition to her gabapentin. She has a controlled substance contract on file in her urine drug screen is up-to-date. I've advised her to remain off of tramadol. We'll try to arrange urgent follow-up with neurosurgery. Her neurosurgeon has since left the group but will see if he can get her in with one of her partners. Flu shot today.

## 2017-01-13 DIAGNOSIS — H40059 Ocular hypertension, unspecified eye: Secondary | ICD-10-CM | POA: Diagnosis not present

## 2017-01-13 DIAGNOSIS — H4089 Other specified glaucoma: Secondary | ICD-10-CM | POA: Diagnosis not present

## 2017-01-13 DIAGNOSIS — H04122 Dry eye syndrome of left lacrimal gland: Secondary | ICD-10-CM | POA: Diagnosis not present

## 2017-01-14 ENCOUNTER — Telehealth: Payer: Self-pay | Admitting: Family

## 2017-01-14 NOTE — Telephone Encounter (Signed)
Please advise in PCP's abscence?

## 2017-01-14 NOTE — Telephone Encounter (Signed)
Had to leave message with [facility], waiting return call

## 2017-01-14 NOTE — Telephone Encounter (Signed)
She can up lyrica to tid She needs to f/u with neurosurgery and pain management

## 2017-01-14 NOTE — Telephone Encounter (Signed)
Caller name: Relation to BT:DHRC Call back number:[phone] Pharmacy:  Reason for call: pt was seen on Tuesday, states Melissa gave her medication for pain, pt states the pain meds is not working and she is still in a lot of pain. Please advise.

## 2017-01-14 NOTE — Telephone Encounter (Signed)
Delsa Sale-- can you call [facility] and check on appt for this pt?  She is already established with them but her Provider has left and we need to get her back in with someone ASAP as her pain is worsening with everything that has been tried so far.  Notified pt and she voices understanding.

## 2017-01-17 MED ORDER — PREGABALIN 75 MG PO CAPS: 75.0000 mg | ORAL_CAPSULE | Freq: Three times a day (TID) | ORAL | 0 refills | Status: DC

## 2017-01-17 MED ORDER — TRAMADOL HCL 50 MG PO TABS: 50.0000 mg | ORAL_TABLET | Freq: Two times a day (BID) | ORAL | 0 refills | Status: DC

## 2017-01-17 NOTE — Telephone Encounter (Signed)
Did she increase lyrica to tid?  Other than tid lyrica, gabapentin and Tylenol I unfortunately don't have anything further to offer.  We are checking the status of her her appointment with neurosurgery.

## 2017-01-17 NOTE — Telephone Encounter (Signed)
Pt is calling to see what kind of meds can she get to help with the pain until she can get in to see the neurologist, states she is in a lot of pain and had a horrible weekend

## 2017-01-17 NOTE — Telephone Encounter (Signed)
Notified pt. She became very upset stating she is in pain and is miserable and she has to have something for her pain. States she is taking lyrica three times daily, taking gabapentin three times daily and taking 2 tylenol three times daily without any relief of her pain. States we are her doctor and we have to give her something that will help her pain.  I reiterated that we are trying to get her in with neurosurgery ASAP and she wants to know what she is supposed to do while she is waiting because she is in tremendous pain.

## 2017-01-17 NOTE — Telephone Encounter (Signed)
Notified pt. She states she does not have any tramadol on hand and will need Rx. Pt now requesting rx go to [facility] [address].  Please advise?

## 2017-01-17 NOTE — Telephone Encounter (Signed)
Rx faxed at 6:32pm. Pt has been made aware by front office staff.

## 2017-01-17 NOTE — Telephone Encounter (Addendum)
Patient requesting new rx, please send to [facility], [place] - [phone] AT [address]

## 2017-01-17 NOTE — Telephone Encounter (Signed)
Case was reviewed with Dr. Charlett Blake.   Please contact pt and let her know that I would like for her d/c gabapentin, continue lyrica tid, Tylenol TID, add tramadol bid prn.  She should let her family know that we are increasing her medication so that they can keep an eye on her. In the meantime we will try to get her back in with neurosurgery.

## 2017-01-17 NOTE — Telephone Encounter (Signed)
See rx. 

## 2017-01-17 NOTE — Telephone Encounter (Signed)
Relation to YQ:MVHQ Call back number:[phone] Pharmacy: [place], [place] - [address] [phone] (Phone) [phone] (Fax)     Reason for call:  Patient states medication prescribed for pain is not working requesting an alternate, please advise

## 2017-01-19 ENCOUNTER — Telehealth: Payer: Self-pay | Admitting: Family

## 2017-01-19 DIAGNOSIS — G894 Chronic pain syndrome: Secondary | ICD-10-CM

## 2017-01-19 NOTE — Telephone Encounter (Signed)
Syniyah Bourne Self [phone]  Makenli called back to say she is in a lot of pain, please call her back and advise.

## 2017-01-19 NOTE — Telephone Encounter (Signed)
Pt wants an epidural injection or something that will help the pain in her neck and left shoulder. The pain medication last given is not working. Please advise.

## 2017-01-19 NOTE — Telephone Encounter (Signed)
[name]-- Can you call [name] and check on status of her follow up with them. She is already established with that group but her doctor left the practice. Thanks!

## 2017-01-20 ENCOUNTER — Encounter: Payer: Self-pay | Admitting: Family Medicine

## 2017-01-20 ENCOUNTER — Encounter: Payer: Self-pay | Admitting: Family

## 2017-01-20 ENCOUNTER — Telehealth: Payer: Self-pay | Admitting: Family

## 2017-01-20 ENCOUNTER — Ambulatory Visit (INDEPENDENT_AMBULATORY_CARE_PROVIDER_SITE_OTHER): Payer: Medicare Other | Admitting: Family Medicine

## 2017-01-20 VITALS — BP 152/72 | HR 64 | Temp 98.1°F | Ht 65.0 in | Wt 157.1 lb

## 2017-01-20 DIAGNOSIS — R030 Elevated blood-pressure reading, without diagnosis of hypertension: Secondary | ICD-10-CM

## 2017-01-20 DIAGNOSIS — M542 Cervicalgia: Secondary | ICD-10-CM

## 2017-01-20 MED ORDER — HYDROCODONE-ACETAMINOPHEN 5-325 MG PO TABS: 1.0000 | ORAL_TABLET | Freq: Four times a day (QID) | ORAL | 0 refills | Status: DC | PRN

## 2017-01-20 MED FILL — HYDROCODON-APAP 5-325: 5-325 | 3 days supply | Qty: 12 | Fill #0

## 2017-01-20 NOTE — Progress Notes (Signed)
Pre visit review using our clinic review tool, if applicable. No additional management support is needed unless otherwise documented below in the visit note. 

## 2017-01-20 NOTE — Progress Notes (Signed)
Musculoskeletal Exam  Patient: Mary Trevino DOB: 1926-07-28  DOS: 01/20/2017  SUBJECTIVE:  Chief Complaint:   Chief Complaint  Patient presents with  . Pain    down the neck, shoulder and arm (left side)    Mary Trevino is a 83 y.o.  female for evaluation and treatment of neck pain.   Onset:  2 weeks ago. No injury or change in activity. Follows w/ NS, reg PCP is working to get her back in. Saw PM&R and got injection, not much help. Having lots of pain.  Location: neck Character:  sharp  Progression of issue:  has worsened Associated symptoms: none Treatment: to date has been Lyrica, injections, Medrol Dosepak.   Neurovascular symptoms: no  ROS: Musculoskeletal/Extremities: +neck pain Neurologic: no numbness, tingling no weakness   Past Medical History:  Diagnosis Date  . Glaucoma   . Headache   . Hyperlipidemia   . Hypertension   . Subarachnoid hemorrhage ([place]) 12/2015   Past Surgical History:  Procedure Laterality Date  . ABDOMINAL HYSTERECTOMY  1975  . CORNEAL TRANSPLANT     2016 (left) 2014 (right)    Family History  Problem Relation Age of Onset  . Hypertension Mother        died of cardiac arrest age 54  . Heart attack Mother   . Other Father        natural causes  . Colon cancer Daughter        died age 81   Current Outpatient Prescriptions  Medication Sig Dispense Refill  . acetaminophen (TYLENOL) 325 MG tablet Take 650 mg by mouth every 4 (four) hours as needed.    [name] atorvastatin (LIPITOR) 10 MG tablet TAKE 1 TABLET DAILY 90 tablet 1  . brimonidine-timolol (COMBIGAN) 0.2-0.5 % ophthalmic solution Place 1 drop into both eyes twice a day    . clindamycin (CLEOCIN) 300 MG capsule Take 2 capsules (600 mg total) by mouth 3 (three) times daily. 42 capsule 0  . diclofenac (CATAFLAM) 50 MG tablet Take 1 tablet (50 mg total) by mouth 2 (two) times daily. 14 tablet 0  . furosemide (LASIX) 20 MG tablet Take 1 tablet (20 mg total) by mouth daily as needed for  edema. 30 tablet 1  . Hydrocodone-Acetaminophen 5-300 MG TABS Take 1 tablet by mouth every 6 (six) hours as needed. 20 each 0  . lidocaine (LIDODERM) 5 % Place 1 patch onto the skin daily. Remove & Discard patch within 12 hours or as directed by MD 30 patch 5  . loteprednol (LOTEMAX) 0.5 % ophthalmic suspension Place 1 drop into the right eye.     . metoprolol succinate (TOPROL XL) 25 MG 24 hr tablet Take 1 tablet (25 mg total) by mouth daily. 90 tablet 3  . mirabegron ER (MYRBETRIQ) 25 MG TB24 tablet Take 1 tablet (25 mg total) by mouth daily. For overactive bladder 90 tablet 1  . prednisoLONE acetate (PRED FORTE) 1 % ophthalmic suspension Place 1 drop into the left eye twice a day    . pregabalin (LYRICA) 75 MG capsule Take 1 capsule (75 mg total) by mouth 3 (three) times daily. 60 capsule 0  . ranitidine (ZANTAC) 150 MG capsule Take 1 capsule (150 mg total) by mouth 2 (two) times daily. 30 capsule 2  . traMADol (ULTRAM) 50 MG tablet Take 1 tablet (50 mg total) by mouth 2 (two) times daily. 60 tablet 0    Objective: VITAL SIGNS: BP (!) 152/72 (BP Location: Right Arm, Patient Position:  Sitting, Cuff Size: Normal)   Pulse 64   Temp 98.1 F (36.7 C) (Oral)   Ht 5\' 5"  (1.651 m)   Wt 157 lb 2 oz (71.3 kg)   SpO2 96%   BMI 26.15 kg/m  Constitutional: Well formed, well developed. No acute distress. Cardiovascular: Brisk cap refill Thorax & Lungs: No accessory muscle use Extremities: No clubbing. No cyanosis. No edema.  Skin: Warm. Dry. No erythema. No rash.  Musculoskeletal: Neck.   Normal active range of motion: no.   Normal passive range of motion: no Tenderness to palpation: Very TTP over L trap and paraspinal msc cervical Deformity: no Ecchymosis: no Grip strength adequate, strength 5/5 in UE's taking age into consideration Neurologic: Normal sensory function. No focal deficits noted. DTR's equal and symmetry in UE's. No clonus. Psychiatric: Normal mood. Age appropriate judgment and  insight. Alert & oriented x 3.    Procedure note; trigger point injections Informed consent obtained. The areas of interest were demarcated with an otoscope speculum tip over the left trapezius. There were then cleaned with alcohol. 0.5 mL were injected at each trigger point with a concoction of 1% lidocaine w/o epi. The areas were then bandaged. There were no complications noted. The patient tolerated the procedure well.   Assessment:  Cervicalgia - Plan: HYDROcodone-acetaminophen (NORCO/VICODIN) 5-325 MG tablet, Inject trigger points, > 3  Elevated blood pressure reading  Plan: Orders as above. Will stop Lyrica and Tramadol to decrease risk of falls. Short course of Norco to hold her over until she can get in. I will reach out to reg PCP to see if she meant to refer her to another pain management center- not sure what they would do as PM&R had done an injection. Try trigger point injections today.  Call office who did injection to ask for other options.  F/u with reg pcp prn.  The patient voiced understanding and agreement to the plan.   [place], DO 01/20/17  4:38 PM

## 2017-01-20 NOTE — Patient Instructions (Addendum)
Heat (pad or rice pillow in microwave) over affected area, 10-15 minutes every 2-3 hours while awake.   Ice/cold pack over area for 10-15 min every 2-3 hours while awake.  Call the office that did your injections for other options.  We have recently placed a referral to your old neurosurgeon's office for them to get you in. You recently received an injection from the pain management team. I will make sure Melissa did not want to refer you to another pain clinic.  Let us know if you need anything.

## 2017-01-20 NOTE — Telephone Encounter (Signed)
Relation to PP:GFQM Call back number:[phone]   Reason for call:  Patient requesting a neurologist / pain management referral due to the pain she's experiencing, patient states pain medication is not working, please advise

## 2017-01-21 ENCOUNTER — Telehealth: Payer: Self-pay | Admitting: Family

## 2017-01-21 DIAGNOSIS — G894 Chronic pain syndrome: Secondary | ICD-10-CM

## 2017-01-21 MED ORDER — HYDROCODONE-ACETAMINOPHEN 5-325 MG PO TABS: 1.0000 | ORAL_TABLET | Freq: Four times a day (QID) | ORAL | 0 refills | Status: DC | PRN

## 2017-01-21 NOTE — Telephone Encounter (Signed)
Mary Trevino Self [phone]  Anyelina called and she is unsure if she was to stop the medicines Lyica and Tramadol while taken the medication prescribed yesterday for 3 days and then start back on them when she finishes or to continue taking along with this medicine. Please call and advise.

## 2017-01-21 NOTE — Telephone Encounter (Signed)
I will extend her pain medication until she get in to see [facility]. I don't want her using more than 2 tabs a day.   Also, I am making a referral to palliative care to see if they can help with her pain.

## 2017-01-21 NOTE — Telephone Encounter (Signed)
Reviewed [place] Controlled substance registry and controlled substance registry is consistent.

## 2017-01-21 NOTE — Telephone Encounter (Signed)
Notified pt and she voices understanding. 

## 2017-01-21 NOTE — Telephone Encounter (Signed)
Attempted to reach pt but "all circuits are busy now" reported. Will try again.

## 2017-01-21 NOTE — Telephone Encounter (Signed)
Notified pt and she voices understanding. Rx placed at front desk for pick up. Pt has been made aware that Rx has to be picked up and if she has someone pick it up for her they will need to sign for the Rx and so ID. Pt voices understanding.

## 2017-01-21 NOTE — Telephone Encounter (Signed)
See 01/11/17 phone note.

## 2017-01-21 NOTE — Telephone Encounter (Signed)
Noted! Thank you

## 2017-01-21 NOTE — Telephone Encounter (Signed)
Notified pt of below. She states she had already stopped the Lyrica and Tramadol but wants to know what she should do for her pain once she completes the Norco. Reminded pt that I had already spoken with her about that concern and have already sent a message to her PCP and will notify her of the outcome today. See 01/19/17 phone note.

## 2017-01-21 NOTE — Telephone Encounter (Signed)
I was able to get through to [facility], [phone] and spoke with [facility]. She states first available is 02/14/17 at 9:30am and will be with one of their PAs; pt should just be there on that date / time and they will let her know who she will see when she arrives. She has placed pt on wait list and will also check with nursing staff to see if pt can be worked in any earlier.

## 2017-01-21 NOTE — Telephone Encounter (Signed)
Correction, see 01/19/17 phone note.

## 2017-01-21 NOTE — Addendum Note (Signed)
Addended by: Debbrah Alar on: 01/21/2017 10:48 AM   Modules accepted: Orders

## 2017-01-21 NOTE — Telephone Encounter (Signed)
Please let pt know that I am placing a referral to a different pain clinic.

## 2017-01-21 NOTE — Telephone Encounter (Signed)
Notified pt of below appt. She wants to know what she should do about pain medication until she can get in with Neuro as she was only prescribed 3 tablets by Dr Nani Ravens. She states that the injections he gave her and pain medication Rx have given her some relief and she doesn't want it to get worse.  Please advise?

## 2017-01-21 NOTE — Telephone Encounter (Signed)
I attempted to reach [facility] this morning and call will not go through; disconnects after transfer. Please advise?

## 2017-01-25 ENCOUNTER — Telehealth: Payer: Self-pay | Admitting: Family

## 2017-01-25 NOTE — Telephone Encounter (Signed)
Notified pt. Pt confused about below appts and is unsure if she should keep both. Called neurosurgeon's office to verify appt on 02/11/17 and she states that appt is with Dr Arrie Eastern (ortho / pain management). He is located at [address] and phone # is [phone]. Notified pt and that she should keep both appts and she voices understanding.

## 2017-01-25 NOTE — Telephone Encounter (Signed)
I don't know anything about her ortho referral. I don't see that we made this referral. We have made referrals to pain clinic and neurosurgery which are both important.  Perhaps another provider arranged ortho referral?

## 2017-01-25 NOTE — Telephone Encounter (Signed)
Pt called in because she said that she have an apt to Ortho (02/11/17) and also Neurosurgery (02/14/17), pt would like to be advised Blair Hailey clarity on which apt should she attend. Pt says that she dont feel that she need both?  Please advise.  CB: [phone]

## 2017-01-31 ENCOUNTER — Other Ambulatory Visit: Payer: Self-pay | Admitting: Cardiology

## 2017-01-31 DIAGNOSIS — R072 Precordial pain: Secondary | ICD-10-CM

## 2017-01-31 DIAGNOSIS — R55 Syncope and collapse: Secondary | ICD-10-CM

## 2017-01-31 DIAGNOSIS — E785 Hyperlipidemia, unspecified: Secondary | ICD-10-CM

## 2017-01-31 DIAGNOSIS — R600 Localized edema: Secondary | ICD-10-CM

## 2017-02-08 ENCOUNTER — Encounter: Payer: Self-pay | Admitting: Family

## 2017-02-09 ENCOUNTER — Ambulatory Visit: Payer: Medicare Other | Admitting: Family

## 2017-02-09 DIAGNOSIS — Z0289 Encounter for other administrative examinations: Secondary | ICD-10-CM

## 2017-02-11 ENCOUNTER — Telehealth: Payer: Self-pay | Admitting: Family

## 2017-02-11 DIAGNOSIS — M4722 Other spondylosis with radiculopathy, cervical region: Secondary | ICD-10-CM | POA: Diagnosis not present

## 2017-02-11 DIAGNOSIS — M5412 Radiculopathy, cervical region: Secondary | ICD-10-CM | POA: Diagnosis not present

## 2017-02-11 DIAGNOSIS — M4802 Spinal stenosis, cervical region: Secondary | ICD-10-CM | POA: Diagnosis not present

## 2017-02-11 DIAGNOSIS — M5030 Other cervical disc degeneration, unspecified cervical region: Secondary | ICD-10-CM | POA: Diagnosis not present

## 2017-02-11 DIAGNOSIS — M9981 Other biomechanical lesions of cervical region: Secondary | ICD-10-CM | POA: Diagnosis not present

## 2017-02-11 NOTE — Telephone Encounter (Signed)
Spoke w/ Melissa at [facility], patient is scheduled to see Joetta Manners, PA on 02/17/17 @ 8:15. Patient is aware of appt, she is requesting to speak with Melissa.

## 2017-02-11 NOTE — Telephone Encounter (Signed)
Relation to EL:TRVU Call back number:[phone] Pharmacy:  Reason for call:   Patient called upset stating she was scheduled to see a specialist and was was informed when she got there they don't do "epidural".   Patient states she received a call from a [place] doctor office today informing her she was referred by her PCP, patient states she would like to discuss why.  Patient states she being referred but specialist(s) are not helping her with the pain she's experiencing, please advise

## 2017-02-11 NOTE — Telephone Encounter (Signed)
Spoke with pt and advised her that I was told Dr Arrie Eastern was pain management as well as ortho and that was why we got her in with him. Pain management referral also went to [facility] and that is who contacted her for appt on 02/17/17 Dory Larsen [place], [place]). Advised pt per verbal from PCP to keep appt with neurosurgery on 02/14/17 as scheduled to see if they will offer epidural. Advised her she may want to keep appt with pain management to establish with them in case she needs them for future use since it takes so long to get in with pain management. Pt voices understanding.  Notified pt's daughter as well and she voices understanding.

## 2017-02-11 NOTE — Telephone Encounter (Signed)
[facility] [place] called having a question about referral for pt, please call her at [phone] press opt 2. Please advise.

## 2017-02-11 NOTE — Telephone Encounter (Signed)
Daughter would like to speak with nurse before 2pm today due to daughter working 3rd shift, daughter would like clarity regarding referrals to specialist and why patient is being referred, best # [phone] Caryn Section (daughter)

## 2017-02-11 NOTE — Telephone Encounter (Signed)
Noted and agree. 

## 2017-02-15 ENCOUNTER — Other Ambulatory Visit: Payer: Self-pay | Admitting: Sports Medicine

## 2017-02-15 DIAGNOSIS — M4802 Spinal stenosis, cervical region: Secondary | ICD-10-CM

## 2017-02-16 ENCOUNTER — Telehealth: Payer: Self-pay | Admitting: *Deleted

## 2017-02-16 MED ORDER — ESOMEPRAZOLE MAGNESIUM 40 MG PO CPDR: 40.0000 mg | DELAYED_RELEASE_CAPSULE | Freq: Every day | ORAL | 1 refills | Status: DC

## 2017-02-16 NOTE — Telephone Encounter (Signed)
Notified pt. 

## 2017-02-16 NOTE — Telephone Encounter (Signed)
Received fax from [facility] requesting new Rx of Nexium. Medication not on current med list. Spoke with pt. She states she used to take this in the past. Has burning in her stomach and gas. Advised her that zantac is on her medication list currently and she states she is unsure if she ever took this. Please advise what pt should take?

## 2017-02-16 NOTE — Telephone Encounter (Signed)
D/C zantac, start nexium, [facility]

## 2017-02-17 DIAGNOSIS — M47892 Other spondylosis, cervical region: Secondary | ICD-10-CM | POA: Diagnosis not present

## 2017-02-17 DIAGNOSIS — G894 Chronic pain syndrome: Secondary | ICD-10-CM | POA: Diagnosis not present

## 2017-02-17 DIAGNOSIS — M9989 Other biomechanical lesions of abdomen and other regions: Secondary | ICD-10-CM | POA: Diagnosis not present

## 2017-02-17 DIAGNOSIS — M519 Unspecified thoracic, thoracolumbar and lumbosacral intervertebral disc disorder: Secondary | ICD-10-CM | POA: Diagnosis not present

## 2017-02-17 DIAGNOSIS — M4802 Spinal stenosis, cervical region: Secondary | ICD-10-CM | POA: Diagnosis not present

## 2017-02-21 ENCOUNTER — Telehealth: Payer: Self-pay

## 2017-02-21 DIAGNOSIS — Z947 Corneal transplant status: Secondary | ICD-10-CM | POA: Diagnosis not present

## 2017-02-21 DIAGNOSIS — Z961 Presence of intraocular lens: Secondary | ICD-10-CM | POA: Diagnosis not present

## 2017-02-21 DIAGNOSIS — H401133 Primary open-angle glaucoma, bilateral, severe stage: Secondary | ICD-10-CM | POA: Diagnosis not present

## 2017-02-21 DIAGNOSIS — H04123 Dry eye syndrome of bilateral lacrimal glands: Secondary | ICD-10-CM | POA: Diagnosis not present

## 2017-02-21 MED ORDER — PANTOPRAZOLE SODIUM 40 MG PO TBEC: 40.0000 mg | DELAYED_RELEASE_TABLET | Freq: Every day | ORAL | 1 refills | Status: DC

## 2017-02-21 NOTE — Telephone Encounter (Signed)
Noted  

## 2017-02-21 NOTE — Telephone Encounter (Signed)
Nexium not covered, preferred alternatives are omeprazole, pantoprazole, and rabeprazole.

## 2017-02-21 NOTE — Telephone Encounter (Signed)
D/c nexium. [facility].

## 2017-02-21 NOTE — Telephone Encounter (Signed)
Spoke with pt and she states that she saw Phylliss Bob w/ [facility] in [place] and they have her set up to do epidural on 03/30/17 and pt states she cannot wait that long. States she is going back to previous doctor (couldn't remember name) and states that she has already had 1 epidural through them and is scheduled for 2 additional epidurals at [facility].

## 2017-02-21 NOTE — Telephone Encounter (Signed)
Notified pt and she voices understanding. 

## 2017-02-23 ENCOUNTER — Ambulatory Visit
Admission: RE | Admit: 2017-02-23 | Discharge: 2017-02-23 | Disposition: A | Discharge status: Home / Self Care | Payer: Medicare Other | Source: Ambulatory Visit | Attending: Sports Medicine | Admitting: Sports Medicine

## 2017-02-23 DIAGNOSIS — M4802 Spinal stenosis, cervical region: Secondary | ICD-10-CM

## 2017-02-23 DIAGNOSIS — M47812 Spondylosis without myelopathy or radiculopathy, cervical region: Secondary | ICD-10-CM | POA: Diagnosis not present

## 2017-02-23 MED ORDER — IOPAMIDOL (ISOVUE-M 300) INJECTION 61%
1.0000 mL | Freq: Once | INTRAMUSCULAR | Status: AC | PRN
  Administered 2017-02-23: 1 mL via EPIDURAL

## 2017-02-23 MED ORDER — TRIAMCINOLONE ACETONIDE 40 MG/ML IJ SUSP (RADIOLOGY)
60.0000 mg | Freq: Once | INTRAMUSCULAR | Status: AC
  Administered 2017-02-23: 60 mg via EPIDURAL

## 2017-02-23 NOTE — Discharge Instructions (Signed)

## 2017-03-01 ENCOUNTER — Telehealth: Payer: Self-pay | Admitting: Family

## 2017-03-01 DIAGNOSIS — M25512 Pain in left shoulder: Secondary | ICD-10-CM

## 2017-03-01 NOTE — Telephone Encounter (Signed)
Pt. Called back. Reports her epidural did "not work at all. I'm having a lot of pain in my shoulder." Requests provider is made aware.

## 2017-03-01 NOTE — Telephone Encounter (Signed)
I made a referral to neurosurgery back in August [facility]) Could you please call their office to see if pt was seen (and request note) and if not see if we can get her an appointment?  In the meantime, I would recommend that she notify the pain clinic who performed the injection that she has not had any relief.  How often is she using the pain medication?  She can use it twice daily as needed.

## 2017-03-01 NOTE — Telephone Encounter (Signed)
Patient would like for Mary Trevino to be made aware that her epidural she had on 02/23/17 did not work. Still in a lot of pain

## 2017-03-01 NOTE — Telephone Encounter (Signed)
Copied from [place] [phone]. Topic: Quick Communication - See Telephone Encounter >> Mar 01, 2017 11:28 AM Synthia Innocent wrote: CRM for notification. See Telephone encounter for:  Epidural did not work 03/01/17.

## 2017-03-02 NOTE — Telephone Encounter (Signed)
Notified pt of below and she is agreeable to proceed with xray. Order entered. Pt states she called neurosurgery back this morning and was told they have nothing else to offer her except surgery and pt is not wanting to proceed with surgery at this time. Advised pt she should follow up with pain management as they would need to manage her chronic pain going forward and if xray shows new finding we will give further instructions at that time. Pt voices understanding.

## 2017-03-02 NOTE — Telephone Encounter (Addendum)
Pt established with [facility] in [place] at that time and was last seen there by PA, Florestine Avers on 02/14/17. Note is available in careEverywhere. Pt states their office referred her to [facility] for the injections and she completed her 2nd one on 02/23/17. Advised her to contact them and let them know that her pain is not better. Pt thinks there may be a problem with her shoulder that is keeping her pain from improving and requests xray of her shoulder for further evaluation?  Pt also established with [facility] pain management on 02/17/17 but they weren't going to be able to get injections set up for her until December. Pt has not been back to them. I spoke with Freada Bergeron at [facility] and verified that pt did not establish with their office (she preferred to stay in [facility] at that time).  Pt reports that she takes Hydrocodone 1-3 times a day. Doesn't like to take it very often as it makes her feel bad. States that pain still is not relieved with 2-3 hydrocodone daily. Pt is aware that PCP is out of the office today and may be Monday before we are able to respond and pt voices understanding.

## 2017-03-02 NOTE — Telephone Encounter (Signed)
Left message for pt to call back if shoulder pain was not her left shoulder that we have been treating her for, otherwise will proceed with xray left shoulder and radiology will be open on Friday from 7am to 5pm.

## 2017-03-02 NOTE — Telephone Encounter (Signed)
OK to put in order for shoulder x ray.  Please verify with pt which shoulder is hurting.  (dx is shoulder pain).  Otherwise follow up with neurosurgery and the office who provided the injections.

## 2017-03-04 ENCOUNTER — Ambulatory Visit (HOSPITAL_BASED_OUTPATIENT_CLINIC_OR_DEPARTMENT_OTHER)
Admission: RE | Admit: 2017-03-04 | Discharge: 2017-03-04 | Disposition: A | Discharge status: Home / Self Care | Payer: Medicare Other | Source: Ambulatory Visit | Attending: Family | Admitting: Family

## 2017-03-04 ENCOUNTER — Encounter: Payer: Self-pay | Admitting: Family

## 2017-03-04 DIAGNOSIS — M25512 Pain in left shoulder: Secondary | ICD-10-CM | POA: Diagnosis not present

## 2017-03-04 DIAGNOSIS — M19012 Primary osteoarthritis, left shoulder: Secondary | ICD-10-CM | POA: Insufficient documentation

## 2017-03-09 ENCOUNTER — Encounter: Payer: Self-pay | Admitting: Family

## 2017-03-09 ENCOUNTER — Ambulatory Visit (INDEPENDENT_AMBULATORY_CARE_PROVIDER_SITE_OTHER): Payer: Medicare Other | Admitting: Family

## 2017-03-09 VITALS — BP 117/65 | HR 79 | Temp 98.8°F | Resp 16 | Ht 65.0 in | Wt 152.0 lb

## 2017-03-09 DIAGNOSIS — I10 Essential (primary) hypertension: Secondary | ICD-10-CM

## 2017-03-09 DIAGNOSIS — M5412 Radiculopathy, cervical region: Secondary | ICD-10-CM | POA: Diagnosis not present

## 2017-03-09 DIAGNOSIS — R55 Syncope and collapse: Secondary | ICD-10-CM

## 2017-03-09 MED ORDER — METOPROLOL SUCCINATE ER 25 MG PO TB24: 25.0000 mg | ORAL_TABLET | Freq: Every day | ORAL | 1 refills | Status: DC

## 2017-03-09 NOTE — Progress Notes (Signed)
Subjective:    Patient ID: Mary Trevino, female    DOB: 1927/03/10, 83 y.o.   MRN: [id number]  HPI  Patient is a 83 year old female who presents today for follow-up of her chronic neck pain and cervical radiculopathy.  She is currently established with [facility] neurosurgery in [facility].  She was referred by neurosurgery for epidural steroid injections.  She had her second injection on February 23, 2017. She has not had much improvement in her pain with the injections. Neurosurgery advised her that they do not have anything else to offer her besides surgery.  She has declined surgery. She is also established with pain management and had her first appointment with them on 02/17/2017.  She requested an x-ray of her shoulder on November 20.   An x-ray noted  She completed this x-ray on 23 November no acute findings.  Note was made however of mild to moderate degenerative changes.  Today she reports ongoing neck pain which radiates into the left shoulder and down the left arm.  She reports pain is chronic.  She is frustrated by the lack of improvement after her epidural steroid injections.  Wants to know what else can be done.  She reports that she "does not want to be on this pain medicine the rest of my life."  Review of Systems See HPI  Past Medical History:  Diagnosis Date  . Glaucoma   . Headache   . Hyperlipidemia   . Hypertension   . Subarachnoid hemorrhage ([place]) 12/2015     Social History   Socioeconomic History  . Marital status: Widowed    Spouse name: Not on file  . Number of children: 3  . Years of education: HS  . Highest education level: Not on file  Social Needs  . Financial resource strain: Not on file  . Food insecurity - worry: Not on file  . Food insecurity - inability: Not on file  . Transportation needs - medical: Not on file  . Transportation needs - non-medical: Not on file  Occupational History  . Occupation: Retied  Tobacco Use  . Smoking status:  Former Smoker    Years: 10.00  . Smokeless tobacco: Never Used  Substance and Sexual Activity  . Alcohol use: No    Alcohol/week: 0.0 oz  . Drug use: No  . Sexual activity: Not on file  Other Topics Concern  . 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   No caffeine use.    Past Surgical History:  Procedure Laterality Date  . ABDOMINAL HYSTERECTOMY  1975  . CORNEAL TRANSPLANT     2016 (left) 2014 (right)     Family History  Problem Relation Age of Onset  . Hypertension Mother        died of cardiac arrest age 24  . Heart attack Mother   . Other Father        natural causes  . Colon cancer Daughter        died age 58    Allergies  Allergen Reactions  . Augmentin [Amoxicillin-Pot Clavulanate] Diarrhea    Current Outpatient Medications on File Prior to Visit  Medication Sig Dispense Refill  . acetaminophen (TYLENOL) 325 MG tablet Take 650  mg by mouth every 4 (four) hours as needed.    [name] atorvastatin (LIPITOR) 10 MG tablet TAKE 1 TABLET DAILY 90 tablet 1  . brimonidine-timolol (COMBIGAN) 0.2-0.5 % ophthalmic solution Place 1 drop into both eyes twice a day    . furosemide (LASIX) 20 MG tablet Take 1 tablet (20 mg total) by mouth daily as needed for edema. 30 tablet 1  . HYDROcodone-acetaminophen (NORCO/VICODIN) 5-325 MG tablet Take 1 tablet by mouth every 6 (six) hours as needed for moderate pain. 12 tablet 0  . HYDROcodone-acetaminophen (NORCO/VICODIN) 5-325 MG tablet Take 1 tablet by mouth every 6 (six) hours as needed for moderate pain. 60 tablet 0  . loteprednol (LOTEMAX) 0.5 % ophthalmic suspension Place 1 drop into the right eye.     . metoprolol succinate (TOPROL-XL) 25 MG 24 hr tablet Take 1 tablet (25 mg total) by mouth daily. Please schedule yearly  appointment for anymore refills, thanks! [phone] 30 tablet 0  . mirabegron ER (MYRBETRIQ) 25 MG TB24 tablet Take 1 tablet (25 mg total) by mouth daily. For overactive bladder 90 tablet 1  . pantoprazole (PROTONIX) 40 MG tablet Take 1 tablet (40 mg total) daily by mouth. 90 tablet 1  . prednisoLONE acetate (PRED FORTE) 1 % ophthalmic suspension Place 1 drop into the left eye twice a day     No current facility-administered medications on file prior to visit.     BP 117/65 (BP Location: Right Arm, Patient Position: Sitting, Cuff Size: Small)   Pulse 79   Temp 98.8 F (37.1 C) (Oral)   Resp 16   Ht 5\' 5"  (1.651 m)   Wt 152 lb (68.9 kg)   SpO2 100%   BMI 25.29 kg/m       Objective:   Physical Exam  Constitutional: She is oriented to person, place, and time. She appears well-developed and well-nourished.  Cardiovascular: Normal rate, regular rhythm and normal heart sounds.  No murmur heard. Pulmonary/Chest: Effort normal and breath sounds normal. No respiratory distress. She has no wheezes.  Musculoskeletal:  No pain with passive range of motion of left shoulder.  Shoulder is nontender.  Neurological: She is alert and oriented to person, place, and time.  Psychiatric: She has a normal Trevino and affect. Her behavior is normal. Judgment and thought content normal.          Assessment & Plan:  Cervical radiculopathy-uncontrolled- pain continues.  I discussed with her that I think her left shoulder pain is referred pain from the cervical radiculopathy.  She does have some mild arthritis however I do not think it accounts for the pain that she is experiencing.  I have advised her at this point her only options are continued treatment with pain medication or surgery.  She does not wish to pursue surgery but is willing to meet again with the neurosurgeon.  I have given her the numbers of the neurosurgical office to schedule follow-up as well as the pain clinic.  I told her that  ultimately I would like for her pain management doctor to pick up the prescribing of her chronic opiates.  Hypertension-blood pressure is stable.  Refill provided on metoprolol.

## 2017-03-09 NOTE — Patient Instructions (Signed)
Please call the Pain clinic to schedule a follow up appointment with Mary Luz NP  [phone] ([place], [place])  Please call to schedule a follow up appointment with the [facility] Neurosurgery clinic  [address], [place], [place] [address] [phone]

## 2017-04-01 ENCOUNTER — Encounter (HOSPITAL_BASED_OUTPATIENT_CLINIC_OR_DEPARTMENT_OTHER): Payer: Self-pay

## 2017-04-01 ENCOUNTER — Other Ambulatory Visit: Payer: Self-pay

## 2017-04-01 ENCOUNTER — Emergency Department (HOSPITAL_BASED_OUTPATIENT_CLINIC_OR_DEPARTMENT_OTHER)
Admission: EM | Admit: 2017-04-01 | Discharge: 2017-04-01 | Disposition: A | Discharge status: Home / Self Care | Payer: Medicare Other | Attending: Emergency Medicine | Admitting: Emergency Medicine

## 2017-04-01 ENCOUNTER — Emergency Department (HOSPITAL_BASED_OUTPATIENT_CLINIC_OR_DEPARTMENT_OTHER): Payer: Medicare Other

## 2017-04-01 DIAGNOSIS — I10 Essential (primary) hypertension: Secondary | ICD-10-CM | POA: Insufficient documentation

## 2017-04-01 DIAGNOSIS — Z87891 Personal history of nicotine dependence: Secondary | ICD-10-CM | POA: Insufficient documentation

## 2017-04-01 DIAGNOSIS — Z79899 Other long term (current) drug therapy: Secondary | ICD-10-CM | POA: Diagnosis not present

## 2017-04-01 DIAGNOSIS — R0789 Other chest pain: Secondary | ICD-10-CM | POA: Diagnosis not present

## 2017-04-01 DIAGNOSIS — I517 Cardiomegaly: Secondary | ICD-10-CM | POA: Diagnosis not present

## 2017-04-01 DIAGNOSIS — R079 Chest pain, unspecified: Secondary | ICD-10-CM | POA: Diagnosis not present

## 2017-04-01 LAB — COMPREHENSIVE METABOLIC PANEL
ALT: 34 U/L (ref 14–54)
ANION GAP: 7 (ref 5–15)
AST: 23 U/L (ref 15–41)
Albumin: 3.8 g/dL (ref 3.5–5.0)
Alkaline Phosphatase: 45 U/L (ref 38–126)
BUN: 17 mg/dL (ref 6–20)
CALCIUM: 9 mg/dL (ref 8.9–10.3)
CHLORIDE: 107 mmol/L (ref 101–111)
CO2: 25 mmol/L (ref 22–32)
CREATININE: 0.79 mg/dL (ref 0.44–1.00)
Glucose, Bld: 101 mg/dL — ABNORMAL HIGH (ref 65–99)
Potassium: 3.9 mmol/L (ref 3.5–5.1)
SODIUM: 139 mmol/L (ref 135–145)
Total Bilirubin: 0.4 mg/dL (ref 0.3–1.2)
Total Protein: 6.9 g/dL (ref 6.5–8.1)

## 2017-04-01 LAB — CBC WITH DIFFERENTIAL/PLATELET
Basophils Absolute: 0 10*3/uL (ref 0.0–0.1)
Basophils Relative: 0 %
EOS ABS: 0.1 10*3/uL (ref 0.0–0.7)
EOS PCT: 1 %
HCT: 38 % (ref 36.0–46.0)
Hemoglobin: 12.8 g/dL (ref 12.0–15.0)
LYMPHS ABS: 1.5 10*3/uL (ref 0.7–4.0)
LYMPHS PCT: 23 %
MCH: 29.2 pg (ref 26.0–34.0)
MCHC: 33.7 g/dL (ref 30.0–36.0)
MCV: 86.8 fL (ref 78.0–100.0)
MONO ABS: 0.5 10*3/uL (ref 0.1–1.0)
MONOS PCT: 9 %
Neutro Abs: 4.2 10*3/uL (ref 1.7–7.7)
Neutrophils Relative %: 67 %
PLATELETS: 196 10*3/uL (ref 150–400)
RBC: 4.38 MIL/uL (ref 3.87–5.11)
RDW: 15.8 % — AB (ref 11.5–15.5)
WBC: 6.3 10*3/uL (ref 4.0–10.5)

## 2017-04-01 LAB — TROPONIN I

## 2017-04-01 MED ORDER — ASPIRIN 81 MG PO CHEW
324.0000 mg | CHEWABLE_TABLET | Freq: Once | ORAL | Status: AC
  Administered 2017-04-01: 324 mg via ORAL
  Filled 2017-04-01: qty 4

## 2017-04-01 MED ORDER — AMLODIPINE BESYLATE 5 MG PO TABS: 5.0000 mg | ORAL_TABLET | Freq: Every day | ORAL | 0 refills | Status: DC

## 2017-04-01 MED ORDER — AMLODIPINE BESYLATE 5 MG PO TABS
5.0000 mg | ORAL_TABLET | Freq: Once | ORAL | Status: AC
  Administered 2017-04-01: 5 mg via ORAL
  Filled 2017-04-01: qty 1

## 2017-04-01 MED ORDER — NITROGLYCERIN 0.4 MG SL SUBL: 0.4000 mg | SUBLINGUAL_TABLET | SUBLINGUAL | Status: DC | PRN

## 2017-04-01 NOTE — ED Notes (Signed)
Patient transported to X-ray 

## 2017-04-01 NOTE — ED Provider Notes (Signed)
[facility] EMERGENCY DEPARTMENT Provider Note   CSN: [id number] Arrival date & time: 04/01/17  1709     History   Chief Complaint Chief Complaint  Patient presents with  . Chest Pain    HPI Mary Trevino is a 83 y.o. female.  HPI   Reports today not feeling well, blood pressure high.  Took blood pressure medications today. At home blood pressures 409-811 systolic, normally 914.  Has had left arm pain for over one month, had epidural/neck injection for it but that didn't help.  Usually that pain Is in the shoulder but today it was more down in the arm.  Also has had pains every once in a while in her chest, has it every few weeks.  Had it this morning. Lasted 5-10 minutes while laying down.  Nothing makes it better or worse. Not worse with exertion.  No shortness of breath, nausea or vomiting.  No lightheadedness.  No chest pain since being here in ED. Has had back pain for several weeks.  Has only been taking tylenol for pain. Has never seen cardiology for symptoms.   Past Medical History:  Diagnosis Date  . Glaucoma   . Headache   . Hyperlipidemia   . Hypertension   . Subarachnoid hemorrhage ([place]) 12/2015    Patient Active Problem List   Diagnosis Date Noted  . Spinal stenosis in cervical region 12/03/2016  . Myofascial pain dysfunction syndrome 12/03/2016  . Overactive bladder 09/21/2016  . History of corneal transplant 08/19/2016  . Depression 06/21/2016  . Cervico-occipital neuralgia of the right side 04/22/2016  . Right lumbar radiculopathy 04/01/2016  . Headache 04/01/2016  . History of subarachnoid hemorrhage 12/23/2015  . Legally blind 11/12/2015  . HTN (hypertension) 09/10/2015  . Glaucoma 09/10/2015  . Hyperlipidemia 09/10/2015    Past Surgical History:  Procedure Laterality Date  . ABDOMINAL HYSTERECTOMY  1975  . CORNEAL TRANSPLANT     2016 (left) 2014 (right)     OB History    No data available       Home Medications    Prior to  Admission medications   Medication Sig Start Date End Date Taking? Authorizing Provider  acetaminophen (TYLENOL) 325 MG tablet Take 650 mg by mouth every 4 (four) hours as needed. 12/20/15   [provider]  amLODipine (NORVASC) 5 MG tablet Take 1 tablet (5 mg total) by mouth daily. 04/01/17   Gareth Morgan, MD  atorvastatin (LIPITOR) 10 MG tablet TAKE 1 TABLET DAILY 10/19/16   Debbrah Alar, NP  brimonidine-timolol (COMBIGAN) 0.2-0.5 % ophthalmic solution Place 1 drop into both eyes twice a day    [provider]  furosemide (LASIX) 20 MG tablet Take 1 tablet (20 mg total) by mouth daily as needed for edema. 09/16/15   Dorothy Spark, MD  HYDROcodone-acetaminophen (NORCO/VICODIN) 5-325 MG tablet Take 1 tablet by mouth every 6 (six) hours as needed for moderate pain. 01/20/17   Shelda Pal, DO  HYDROcodone-acetaminophen (NORCO/VICODIN) 5-325 MG tablet Take 1 tablet by mouth every 6 (six) hours as needed for moderate pain. 01/21/17   Debbrah Alar, NP  loteprednol (LOTEMAX) 0.5 % ophthalmic suspension Place 1 drop into the right eye.  09/24/15   [provider]  metoprolol succinate (TOPROL-XL) 25 MG 24 hr tablet Take 1 tablet (25 mg total) by mouth daily. Please schedule yearly appointment for anymore refills, thanks! [phone] 03/09/17   Debbrah Alar, NP  mirabegron ER (MYRBETRIQ) 25 MG TB24 tablet Take 1  tablet (25 mg total) by mouth daily. For overactive bladder 09/21/16   Debbrah Alar, NP  pantoprazole (PROTONIX) 40 MG tablet Take 1 tablet (40 mg total) daily by mouth. 02/21/17   Debbrah Alar, NP  prednisoLONE acetate (PRED FORTE) 1 % ophthalmic suspension Place 1 drop into the left eye twice a day    [provider]    Family History Family History  Problem Relation Age of Onset  . Hypertension Mother        died of cardiac arrest age 17  . Heart attack Mother   . Other Father        natural causes  .  Colon cancer Daughter        died age 69    Social History Social History   Tobacco Use  . Smoking status: Former Smoker    Years: 10.00  . Smokeless tobacco: Never Used  Substance Use Topics  . Alcohol use: No    Alcohol/week: 0.0 oz  . Drug use: No     Allergies   Augmentin [amoxicillin-pot clavulanate]   Review of Systems Review of Systems  Constitutional: Negative for fever.  HENT: Negative for sore throat.   Eyes: Negative for visual disturbance.  Respiratory: Negative for cough and shortness of breath.   Cardiovascular: Positive for chest pain.  Gastrointestinal: Negative for abdominal pain, nausea and vomiting.  Genitourinary: Negative for difficulty urinating and dysuria.  Musculoskeletal: Positive for back pain (reports has had pain in middle of back for 3 weeks). Negative for neck pain.  Skin: Negative for rash.  Neurological: Negative for syncope, facial asymmetry, weakness, numbness and headaches.     Physical Exam Updated Vital Signs BP (!) 161/89 (BP Location: Right Arm)   Pulse 80   Temp 99.6 F (37.6 C) (Oral)   Resp 18   Ht 5\' 4"  (1.626 m)   Wt 68 kg (150 lb)   SpO2 98%   BMI 25.75 kg/m   Physical Exam  Constitutional: She is oriented to person, place, and time. She appears well-developed and well-nourished. No distress.  HENT:  Head: Normocephalic and atraumatic.  Eyes: Conjunctivae and EOM are normal.  Neck: Normal range of motion.  Cardiovascular: Normal rate, regular rhythm, normal heart sounds and intact distal pulses. Exam reveals no gallop and no friction rub.  No murmur heard. Pulmonary/Chest: Effort normal and breath sounds normal. No respiratory distress. She has no wheezes. She has no rales.  Abdominal: Soft. She exhibits no distension. There is no tenderness. There is no guarding.  Musculoskeletal: She exhibits no edema or tenderness.  Neurological: She is alert and oriented to person, place, and time.  Skin: Skin is warm and  dry. No rash noted. She is not diaphoretic. No erythema.  Nursing note and vitals reviewed.    ED Treatments / Results  Labs (all labs ordered are listed, but only abnormal results are displayed) Labs Reviewed  CBC WITH DIFFERENTIAL/PLATELET - Abnormal; Notable for the following components:      Result Value   RDW 15.8 (*)    All other components within normal limits  COMPREHENSIVE METABOLIC PANEL - Abnormal; Notable for the following components:   Glucose, Bld 101 (*)    All other components within normal limits  TROPONIN I  TROPONIN I    EKG  EKG Interpretation  Date/Time:  Friday April 01 2017 17:15:15 EST Ventricular Rate:  63 PR Interval:  150 QRS Duration: 72 QT Interval:  398 QTC Calculation: 407 R Axis:   -  73 Text Interpretation:  Sinus rhythm with occasional Premature ventricular complexes Left axis deviation Pulmonary disease pattern Abnormal ECG No previous ECGs available Confirmed by Gareth Morgan [phone]) on 04/01/2017 5:27:11 PM       Radiology Dg Chest 2 View  Result Date: 04/01/2017 CLINICAL DATA:  Acute onset of hypertension EXAM: CHEST  2 VIEW COMPARISON:  10/23/2015 FINDINGS: No focal pulmonary infiltrate or effusion. Borderline to mild cardiomegaly. Aortic atherosclerosis. No pneumothorax. IMPRESSION: No active cardiopulmonary disease. Stable borderline to mild cardiomegaly. Electronically Signed   By: Donavan Foil M.D.   On: 04/01/2017 18:15    Procedures Procedures (including critical care time)  Medications Ordered in ED Medications  aspirin chewable tablet 324 mg (324 mg Oral Given 04/01/17 1748)  amLODipine (NORVASC) tablet 5 mg (5 mg Oral Given 04/01/17 1918)     Initial Impression / Assessment and Plan / ED Course  I have reviewed the triage vital signs and the nursing notes.  Pertinent labs & imaging results that were available during my care of the patient were reviewed by me and considered in my medical decision making (see  chart for details).     83yo female with history above presents with concern for elevated blood pressures and episode of chest pain.  Differential diagnosis for chest pain includes pulmonary embolus, dissection, pneumothorax, pneumonia, ACS, myocarditis, pericarditis.  EKG was done and evaluate by me and showed no acute ST changes and no signs of pericarditis. Chest x-ray was done and evaluated by me and radiology and showed no sign of pneumonia or pneumothorax. No dyspnea or continuing symptoms, doubt PE. Normal bilateral upper and LE pulses, normal XR, do not feel hx or exam consistent with dissection. [place] troponin negative, no sign of ACS. Patient with brief episode of discomfort in chest this AM without return of pain, without exertional symptoms or associated symptoms.  Noted to have htn on arrival, blood pressures improving spontaneously.  No sign of htn emergency.  Will initiate amlodipine for outpt treatment of htn. Pt reports she has previously been on this medication. Given 5mg  amlodipine rx, recommend close follow up with PCP. Patient discharged in stable condition with understanding of reasons to return.    Final Clinical Impressions(s) / ED Diagnoses   Final diagnoses:  Chest pain, unspecified type  Essential hypertension    ED Discharge Orders        Ordered    amLODipine (NORVASC) 5 MG tablet  Daily     04/01/17 Pamella Pert, MD 04/02/17 0301

## 2017-04-01 NOTE — ED Triage Notes (Signed)
C/o CP x today-presents to triage in w/c

## 2017-04-01 NOTE — ED Notes (Signed)
Pt discharged to home with family. NAD.  

## 2017-04-06 ENCOUNTER — Other Ambulatory Visit: Payer: Self-pay | Admitting: Family

## 2017-04-08 DIAGNOSIS — G894 Chronic pain syndrome: Secondary | ICD-10-CM | POA: Diagnosis not present

## 2017-04-08 DIAGNOSIS — M519 Unspecified thoracic, thoracolumbar and lumbosacral intervertebral disc disorder: Secondary | ICD-10-CM | POA: Diagnosis not present

## 2017-04-08 DIAGNOSIS — M4802 Spinal stenosis, cervical region: Secondary | ICD-10-CM | POA: Diagnosis not present

## 2017-04-08 DIAGNOSIS — M9989 Other biomechanical lesions of abdomen and other regions: Secondary | ICD-10-CM | POA: Diagnosis not present

## 2017-04-08 DIAGNOSIS — M47892 Other spondylosis, cervical region: Secondary | ICD-10-CM | POA: Diagnosis not present

## 2017-04-15 ENCOUNTER — Telehealth: Payer: Self-pay | Admitting: Family

## 2017-04-15 ENCOUNTER — Encounter: Payer: Self-pay | Admitting: Family

## 2017-04-15 ENCOUNTER — Ambulatory Visit (INDEPENDENT_AMBULATORY_CARE_PROVIDER_SITE_OTHER): Payer: Medicare Other | Admitting: Family

## 2017-04-15 VITALS — BP 117/68 | HR 67 | Temp 98.2°F | Resp 16 | Ht 62.0 in | Wt 152.0 lb

## 2017-04-15 DIAGNOSIS — R0789 Other chest pain: Secondary | ICD-10-CM | POA: Diagnosis not present

## 2017-04-15 DIAGNOSIS — I10 Essential (primary) hypertension: Secondary | ICD-10-CM | POA: Diagnosis not present

## 2017-04-15 DIAGNOSIS — G894 Chronic pain syndrome: Secondary | ICD-10-CM

## 2017-04-15 MED ORDER — AMLODIPINE BESYLATE 5 MG PO TABS: 5.0000 mg | ORAL_TABLET | Freq: Every day | ORAL | 11 refills | Status: DC

## 2017-04-15 NOTE — Telephone Encounter (Signed)
LR: 04/01/17 30#  OV: 04/15/17 Refilled for 1 year sent to [facility]

## 2017-04-15 NOTE — Progress Notes (Signed)
Subjective:    Patient ID: Mary Trevino, female    DOB: 10-13-1926, 84 y.o.   MRN: [id number]  HPI  Mary Trevino is an 84 yr old female who presents today for ER follow up.   ER Record is reviewed from her visit on 04/01/17.  When she arrived in the emergency department she reported intermittent chest pain which had occurred on the morning of her visit at rest.  This lasted 5-10 minutes.  Her blood pressure was elevated at that time 161/89.  Chest x-ray showed no active cardiopulmonary disease.  EKG did not show any acute changes.  Troponin was negative.  She was started on amlodipine 5 mg once daily and advised to follow-up with [place] for repeat blood pressure. Reports occasional chest pain which is generally when she lays down.  S BP Readings from Last 3 Encounters:  04/15/17 117/68  04/01/17 (!) 161/89  03/09/17 117/65    Chronic pain-  She is scheduled for another epidural at the end of the month. She is not taking hydrocodone. She is using tylenol but not regularly. Not using hydrocodone as it does not help.   Review of Systems    see HPI  Past Medical History:  Diagnosis Date  . Glaucoma   . Headache   . Hyperlipidemia   . Hypertension   . Subarachnoid hemorrhage ([place]) 12/2015     Social History   Socioeconomic History  . Marital status: Widowed    Spouse name: Not on file  . Number of children: 3  . Years of education: HS  . Highest education level: Not on file  Social Needs  . Financial resource strain: Not on file  . Food insecurity - worry: Not on file  . Food insecurity - inability: Not on file  . Transportation needs - medical: Not on file  . Transportation needs - non-medical: Not on file  Occupational History  . Occupation: Retied  Tobacco Use  . Smoking status: Former Smoker    Years: 10.00  . Smokeless tobacco: Never Used  Substance and Sexual Activity  . Alcohol use: No    Alcohol/week: 0.0 oz  . Drug use: No  . Sexual activity: Not on file  Other  Topics Concern  . 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   No caffeine use.    Past Surgical History:  Procedure Laterality Date  . ABDOMINAL HYSTERECTOMY  1975  . CORNEAL TRANSPLANT     2016 (left) 2014 (right)     Family History  Problem Relation Age of Onset  . Hypertension Mother        died of cardiac arrest age 95  . Heart attack Mother   . Other Father        natural causes  . Colon cancer Daughter        died age 72    Allergies  Allergen Reactions  . Augmentin [Amoxicillin-Pot Clavulanate] Diarrhea    Current Outpatient Medications on File Prior to Visit  Medication Sig Dispense Refill  . acetaminophen (TYLENOL) 325 MG tablet Take 650 mg by mouth every 4 (four) hours as needed.    [name] amLODipine (NORVASC) 5 MG tablet Take 1 tablet (5 mg total) by mouth  daily. 30 tablet 0  . atorvastatin (LIPITOR) 10 MG tablet Take 1 tablet (10 mg total) by mouth daily. 90 tablet 1  . brimonidine-timolol (COMBIGAN) 0.2-0.5 % ophthalmic solution Place 1 drop into both eyes twice a day    . furosemide (LASIX) 20 MG tablet Take 1 tablet (20 mg total) by mouth daily as needed for edema. 30 tablet 1  . HYDROcodone-acetaminophen (NORCO/VICODIN) 5-325 MG tablet Take 1 tablet by mouth every 6 (six) hours as needed for moderate pain. 12 tablet 0  . HYDROcodone-acetaminophen (NORCO/VICODIN) 5-325 MG tablet Take 1 tablet by mouth every 6 (six) hours as needed for moderate pain. 60 tablet 0  . loteprednol (LOTEMAX) 0.5 % ophthalmic suspension Place 1 drop into the right eye.     . metoprolol succinate (TOPROL-XL) 25 MG 24 hr tablet Take 1 tablet (25 mg total) by mouth daily. Please schedule yearly appointment for anymore refills, thanks! [phone] 90 tablet 1  . mirabegron ER  (MYRBETRIQ) 25 MG TB24 tablet Take 1 tablet (25 mg total) by mouth daily. For overactive bladder 90 tablet 1  . pantoprazole (PROTONIX) 40 MG tablet Take 1 tablet (40 mg total) daily by mouth. 90 tablet 1  . prednisoLONE acetate (PRED FORTE) 1 % ophthalmic suspension Place 1 drop into the left eye twice a day     No current facility-administered medications on file prior to visit.     BP 117/68 (BP Location: Left Arm, Patient Position: Sitting, Cuff Size: Small)   Pulse 67   Temp 98.2 F (36.8 C) (Oral)   Resp 16   Ht 5\' 2"  (1.575 m)   Wt 152 lb (68.9 kg)   SpO2 100%   BMI 27.80 kg/m    Objective:   Physical Exam  Constitutional: She is oriented to person, place, and time. She appears well-developed and well-nourished.  HENT:  Head: Normocephalic and atraumatic.  Cardiovascular: Normal rate, regular rhythm and normal heart sounds.  No murmur heard. Pulmonary/Chest: Effort normal and breath sounds normal. No respiratory distress. She has no wheezes.  Musculoskeletal: She exhibits no edema.  Neurological: She is alert and oriented to person, place, and time.  Psychiatric: She has a normal Trevino and affect. Her behavior is normal. Judgment and thought content normal.          Assessment & Plan:  HTN- BP is improved following addition of amlodipine 5mg .  Continue same.  Chronic pain- we discussed adding standing tylenol 1000mg  tid.  She is also continuing with pain management for ESI's.    Atypical CP- had neg stress test 2017, neg troponin in the ED.  Pt is advised to  please go to the ER if you develop recurrent/severe chest pain that does not resolve in a few minutes on its own.

## 2017-04-15 NOTE — Telephone Encounter (Signed)
Copied from [place] [id number]. Topic: Quick Communication - Rx Refill/Question >> Apr 15, 2017 11:19 AM Synthia Innocent wrote: Has the patient contacted their pharmacy? Yes.     (Agent: If no, request that the patient contact the pharmacy for the refill.)   Preferred Pharmacy (with phone number or street name): [facility]   Agent: Please be advised that RX refills may take up to 3 business days. We ask that you follow-up with your pharmacy. Requesting refill on amLODipine (NORVASC) 5 MG tablet

## 2017-04-15 NOTE — Patient Instructions (Signed)
For pain you may use tylenol 1000mg  every 8 hours. Please go to the ER if you develop recurrent/severe chest pain that does not resolve in a few minutes on its own.

## 2017-04-19 ENCOUNTER — Telehealth: Payer: Self-pay | Admitting: Family

## 2017-04-19 DIAGNOSIS — R519 Headache, unspecified: Secondary | ICD-10-CM

## 2017-04-19 DIAGNOSIS — R51 Headache: Principal | ICD-10-CM

## 2017-04-19 NOTE — Telephone Encounter (Signed)
Copied from [place] [id number]. Topic: General - Other >> Apr 19, 2017 11:25 AM Scherrie Gerlach wrote: Reason for CRM: pt states she saw Melissa on 04/15/2017 and forgot to tell Melissa she is having frequent headaches, just about every day. Sometimes back of head, top, side of head.  Not always the same place.  Just felt like she needed to let you know

## 2017-04-19 NOTE — Telephone Encounter (Signed)
Noted, recommend tylenol as needed for headache.

## 2017-04-19 NOTE — Telephone Encounter (Signed)
Ok, or should we send referral to her current neurologist in [place] if she prefers?

## 2017-04-19 NOTE — Telephone Encounter (Signed)
Notified pt. She states she has been taking tylenol and it doesn't seem to help. Headaches are intermittent daily as described below but she states they are "terrible when they occur".  She denies worsening vision or nausea with headaches. She was just "wondering if she needed any testing" but states she will continue taking the tylenol.  Please advise if any further recommendation?

## 2017-04-19 NOTE — Telephone Encounter (Signed)
Please place referral to [place] neurology for HA's.

## 2017-04-19 NOTE — Telephone Encounter (Signed)
Ok to send to current neurologist.

## 2017-04-20 ENCOUNTER — Encounter: Payer: Self-pay | Admitting: Neurology

## 2017-04-20 NOTE — Telephone Encounter (Signed)
Notified pt and she is agreeable to referral. I looked further into her chart and looks like she has only been seeing neurosurgery in the past. Pt prefers to stay in [facility]. Advised her that we will refer her to [facility]. Referral placed.

## 2017-04-21 DIAGNOSIS — H4089 Other specified glaucoma: Secondary | ICD-10-CM | POA: Diagnosis not present

## 2017-04-22 ENCOUNTER — Telehealth: Payer: Self-pay | Admitting: Family

## 2017-04-22 MED ORDER — GABAPENTIN 300 MG PO CAPS: 300.0000 mg | ORAL_CAPSULE | Freq: Every day | ORAL | 1 refills | Status: DC

## 2017-04-22 NOTE — Telephone Encounter (Signed)
Requesting refill of Gabapentin  LOV 04/15/17 with M. O'Sullivan,NP

## 2017-04-22 NOTE — Telephone Encounter (Signed)
Copied from [place] [id number]. Topic: Quick Communication - See Telephone Encounter >> Apr 22, 2017 10:12 AM Boyd Kerbs wrote: CRM for notification. See Telephone encounter for:   Patient requesting refill for Gabapentin 300   [facility] - [address], [address] [place] [address] Phone: [phone] Fax: [phone]    04/22/17.

## 2017-04-22 NOTE — Telephone Encounter (Signed)
Mary Trevino-- Rx is on pt's refill history but not on current med list.  Please advise?

## 2017-04-27 ENCOUNTER — Encounter: Payer: Self-pay | Admitting: Neurology

## 2017-04-27 ENCOUNTER — Ambulatory Visit (INDEPENDENT_AMBULATORY_CARE_PROVIDER_SITE_OTHER): Payer: Medicare Other | Admitting: Neurology

## 2017-04-27 VITALS — BP 94/60 | HR 78 | Ht 64.0 in | Wt 149.5 lb

## 2017-04-27 DIAGNOSIS — Z8679 Personal history of other diseases of the circulatory system: Secondary | ICD-10-CM | POA: Diagnosis not present

## 2017-04-27 DIAGNOSIS — G44329 Chronic post-traumatic headache, not intractable: Secondary | ICD-10-CM

## 2017-04-27 MED ORDER — GABAPENTIN 100 MG PO CAPS: ORAL_CAPSULE | ORAL | 0 refills | Status: DC

## 2017-04-27 NOTE — Progress Notes (Signed)
NEUROLOGY CONSULTATION NOTE  Jamaris Biernat MRN: [id number] DOB: Sep 13, 1926  Referring provider: Debbrah Alar, NP Primary care provider: Ashby Dawes  Reason for consult:  headache  HISTORY OF PRESENT ILLNESS: Mary Trevino is an 84 year old right-handed female with glaucoma and corneal transplant with blindness in right eye, hypertension, headache, atypical chest pain and history of subarachnoid hemorrhage in September 2017 who presents for headache.   History supplemented by PCP and hospital notes.  Onset:  Since September 2017 after she fell out of bed and hit the right side of her head against the wall.  She developed right-sided headache, neck and back pain.  She was admitted to [facility] where CT of head from 12/18/15 revealed small subarachnoid hemorrhage in the right frontal parietal region.  She was admitted for monitoring.  Surgery was not indicated.  Repeat head CT from 01/01/16 demonstrated that it was nearly resolved. Location:  Varies (back of head, top of head, either temple) Quality:  pressure Intensity:  Moderate to severe Aura:  no Prodrome:  no Postdrome:  no Associated symptoms:  No nausea, vomiting, photophobia, phonophobia, osmophobia, autonomic symptoms or visual disturbance.  She has not had any new worse headache of her life, waking up from sleep Duration:  Usually 10 minutes but sometimes longer Frequency:  Almost daily up to several times a day Frequency of abortive medication: unsure Triggers/exacerbating factors:  unsure Relieving factors:  Tylenol Activity:  Does not aggravate  MRI of brain without contrast from 04/16/16 was personally reviewed and was unremarkable.  She also has chronic neck pain with pain radiating down the left arm.  MRI of cervical spine from 11/20/16 was personally reviewed and demonstrated multilevel cervical degenerative disc disease with left sided foraminal stenosis, most notable at C4-5 and C5-6.  However, she  also demonstrates facet hypertrophy at C2-3 causing moderate right foraminal stenosis and broad-based disc osteophyte complex at C3-4 causing moderate foraminal narrowing, worse on the right.  She is followed by neurosurgery/interventional pain management in [facility].  She has received epidural injections that have been ineffective.   Past NSAIDS:  ibuprofen Past analgesics:  tramadol Past abortive triptans:  no Past muscle relaxants:  no Past anti-emetic:  no Past antihypertensive medications:  no Past antidepressant medications:  nortriptyline 20mg  Past anticonvulsant medications:  Lyrica Past vitamins/Herbal/Supplements:  no Past antihistamines/decongestants:  no Other past therapies:  Physical therapy, epidural injections for neck  Current NSAIDS:  no Current analgesics:  Tylenol Current triptans:  no Current anti-emetic:  no Current muscle relaxants:  no Current anti-anxiolytic:  no Current sleep aide:  no Current Antihypertensive medications:  Toprol XL, Lasix, amlodipine Current Antidepressant medications:  no Current Anticonvulsant medications:  gabapentin 300mg  at bedtime Current Vitamins/Herbal/Supplements:  MVI Current Antihistamines/Decongestants:  no Other therapy:  no  PAST MEDICAL HISTORY: Past Medical History:  Diagnosis Date  . Glaucoma   . Headache   . Hyperlipidemia   . Hypertension   . Subarachnoid hemorrhage ([place]) 12/2015    PAST SURGICAL HISTORY: Past Surgical History:  Procedure Laterality Date  . ABDOMINAL HYSTERECTOMY  1975  . CORNEAL TRANSPLANT     2016 (left) 2014 (right)     MEDICATIONS: Current Outpatient Medications on File Prior to Visit  Medication Sig Dispense Refill  . acetaminophen (TYLENOL) 325 MG tablet Take 650 mg by mouth every 4 (four) hours as needed.    [name] amLODipine (NORVASC) 5 MG tablet Take 1 tablet (5 mg total) by mouth daily. 30 tablet 11  .  atorvastatin (LIPITOR) 10 MG tablet Take 1 tablet (10 mg total) by mouth  daily. 90 tablet 1  . brimonidine-timolol (COMBIGAN) 0.2-0.5 % ophthalmic solution Place 1 drop into both eyes twice a day    . furosemide (LASIX) 20 MG tablet Take 1 tablet (20 mg total) by mouth daily as needed for edema. 30 tablet 1  . gabapentin (NEURONTIN) 300 MG capsule Take 1 capsule (300 mg total) by mouth at bedtime. 90 capsule 1  . HYDROcodone-acetaminophen (NORCO/VICODIN) 5-325 MG tablet Take 1 tablet by mouth every 6 (six) hours as needed for moderate pain. 12 tablet 0  . loteprednol (LOTEMAX) 0.5 % ophthalmic suspension Place 1 drop into the right eye.     . metoprolol succinate (TOPROL-XL) 25 MG 24 hr tablet Take 1 tablet (25 mg total) by mouth daily. Please schedule yearly appointment for anymore refills, thanks! [phone] 90 tablet 1  . mirabegron ER (MYRBETRIQ) 25 MG TB24 tablet Take 1 tablet (25 mg total) by mouth daily. For overactive bladder 90 tablet 1  . pantoprazole (PROTONIX) 40 MG tablet Take 1 tablet (40 mg total) daily by mouth. 90 tablet 1  . prednisoLONE acetate (PRED FORTE) 1 % ophthalmic suspension Place 1 drop into the left eye twice a day     No current facility-administered medications on file prior to visit.     ALLERGIES: Allergies  Allergen Reactions  . Augmentin [Amoxicillin-Pot Clavulanate] Diarrhea    FAMILY HISTORY: Family History  Problem Relation Age of Onset  . Hypertension Mother        died of cardiac arrest age 41  . Heart attack Mother   . Other Father        natural causes  . Colon cancer Daughter        died age 79    SOCIAL HISTORY: Social History   Socioeconomic History  . Marital status: Widowed    Spouse name: Not on file  . Number of children: 3  . Years of education: HS  . Highest education level: Not on file  Social Needs  . Financial resource strain: Not on file  . Food insecurity - worry: Not on file  . Food insecurity - inability: Not on file  . Transportation needs - medical: Not on file  . Transportation  needs - non-medical: Not on file  Occupational History  . Occupation: Retied  Tobacco Use  . Smoking status: Former Smoker    Years: 10.00  . Smokeless tobacco: Never Used  Substance and Sexual Activity  . Alcohol use: No    Alcohol/week: 0.0 oz  . Drug use: No  . Sexual activity: Not on file  Other Topics Concern  . 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   No caffeine use.    REVIEW OF SYSTEMS: Constitutional: No fevers, chills, or sweats, no generalized fatigue, change in appetite Eyes: No visual changes, double vision, eye pain Ear, nose and throat: No hearing loss, ear pain, nasal congestion, sore throat Cardiovascular: No chest pain, palpitations Respiratory:  No shortness of breath at rest or with exertion, wheezes GastrointestinaI: No nausea, vomiting, diarrhea, abdominal pain, fecal incontinence Genitourinary:  No dysuria, urinary retention or frequency Musculoskeletal:  No neck pain, back pain Integumentary:  No rash, pruritus, skin lesions Neurological: as above Psychiatric: No depression, insomnia, anxiety Endocrine: No palpitations, fatigue, diaphoresis, mood swings, change in appetite, change in weight, increased thirst Hematologic/Lymphatic:  No purpura, petechiae. Allergic/Immunologic: no itchy/runny eyes, nasal congestion, recent allergic reactions, rashes  PHYSICAL EXAM: Vitals:   04/27/17 0805  BP: 94/60  Pulse: 78  SpO2: 96%   General: No acute distress.  Patient appears well-groomed.  Head:  Normocephalic/atraumatic Eyes:  fundi examined but not visualized Neck: supple, no paraspinal tenderness, full range of motion Back: No paraspinal tenderness Heart: regular rate and rhythm Lungs: Clear to auscultation bilaterally. Vascular: No  carotid bruits. Neurological Exam: Mental status: alert and oriented to person, place, and time, recent and remote memory intact, fund of knowledge intact, attention and concentration intact, speech fluent and not dysarthric, language intact. Cranial nerves: CN I: not tested CN II: bilateral surgical pupils asymmetric (larger on right) and non-reactive to light, blind in right eye CN III, IV, VI:  full range of motion, no nystagmus, no ptosis CN V: facial sensation intact CN VII: upper and lower face symmetric CN VIII: hearing intact CN IX, X: gag intact, uvula midline CN XI: sternocleidomastoid and trapezius muscles intact CN XII: tongue midline Bulk & Tone: normal, no fasciculations. Motor:  5/5 throughout  Sensation:  Pinprick and vibration sensation intact. Deep Tendon Reflexes:  2+ throughout, toes downgoing.  Finger to nose testing:  Without dysmetria.  Gait:  Cautious.  Able to turn. Romberg negative.  IMPRESSION: Chronic post-traumatic headache  PLAN: 1.  Titrate gabapentin to 300mg  twice daily.  Cautioned her about dizziness and drowsiness and to contact me if she experiences these symptoms.  If needed/tolerating, we can increase dose or switch to nortriptyline (she previously was on 20mg  which is a low dose). 2.  Follow up in 2 to 3 months.  Thank you for allowing me to take part in the care of this patient.  Metta Clines, DO  CC: Debbrah Alar, NP

## 2017-04-27 NOTE — Patient Instructions (Addendum)
1.  We will add dose of gabapentin in morning.  I will prescribe you a 100mg  pill to take in the morning and you will increase dose every week until you are taking total of 300mg  in the morning.  Continue the 300mg  pill at night.  Take 100mg  (1 pill) in morning for 1 week  Then 200mg  (2 pills) in morning for 1 week  Then 300mg  (3 pills)  Continue the 300mg  pill at bedtime.  Caution for dizziness or drowsiness.  Contact me if you have side effects such as dizziness or drowsiness. 2.  Contact me after you have been on 300mg  twice daily for about 4 weeks and we can either continue that dose or increase dose/change medication if needed.   3.  Follow up in 2 to 3 months.

## 2017-05-25 DIAGNOSIS — H04123 Dry eye syndrome of bilateral lacrimal glands: Secondary | ICD-10-CM | POA: Diagnosis not present

## 2017-05-25 DIAGNOSIS — Z947 Corneal transplant status: Secondary | ICD-10-CM | POA: Diagnosis not present

## 2017-06-21 ENCOUNTER — Ambulatory Visit (INDEPENDENT_AMBULATORY_CARE_PROVIDER_SITE_OTHER): Payer: Medicare Other | Admitting: Family

## 2017-06-21 VITALS — BP 120/66 | HR 68 | Ht 64.0 in | Wt 146.2 lb

## 2017-06-21 DIAGNOSIS — M5416 Radiculopathy, lumbar region: Secondary | ICD-10-CM | POA: Diagnosis not present

## 2017-06-21 DIAGNOSIS — K5900 Constipation, unspecified: Secondary | ICD-10-CM

## 2017-06-21 DIAGNOSIS — M5414 Radiculopathy, thoracic region: Secondary | ICD-10-CM

## 2017-06-21 MED ORDER — MELOXICAM 7.5 MG PO TABS: 7.5000 mg | ORAL_TABLET | Freq: Every day | ORAL | 0 refills | Status: DC

## 2017-06-21 MED FILL — MELOXICAM 7.5 MG TABLET: 7.5 | 14 days supply | Qty: 14 | Fill #0

## 2017-06-21 NOTE — Progress Notes (Signed)
Subjective:    Patient ID: Mary Trevino, female    DOB: 08-18-1926, 84 y.o.   MRN: [id number]  HPI  Pt reports a burning sensation across the lower abdomen.  Only occurs when she is laying down for her nap in the afternoons. Reports that she lays there until it passes.  "like a burning pain." Reports that she reports that she has some similar pain that is in the front of both legs near her feet.   She denies dysuria.  Reports that she sometimes goes 3-4 days without BM. Reports that she drinks plenty of water and fresh produce. Reports that bm's are hard and she finds herself straining and not getting a "good bowel movement."     Review of Systems   See HPI  Past Medical History:  Diagnosis Date  . Glaucoma   . Headache   . Hyperlipidemia   . Hypertension   . Subarachnoid hemorrhage ([place]) 12/2015     Social History   Socioeconomic History  . Marital status: Widowed    Spouse name: Not on file  . Number of children: 3  . Years of education: HS  . Highest education level: Not on file  Social Needs  . Financial resource strain: Not on file  . Food insecurity - worry: Not on file  . Food insecurity - inability: Not on file  . Transportation needs - medical: Not on file  . Transportation needs - non-medical: Not on file  Occupational History  . Occupation: [employer]  Tobacco Use  . Smoking status: Former Smoker    Years: 10.00  . Smokeless tobacco: Never Used  Substance and Sexual Activity  . Alcohol use: No    Alcohol/week: 0.0 oz  . Drug use: No  . Sexual activity: Not on file  Other Topics Concern  . 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   No caffeine use.    Past Surgical History:  Procedure Laterality Date  . ABDOMINAL HYSTERECTOMY  1975    . CORNEAL TRANSPLANT     2016 (left) 2014 (right)     Family History  Problem Relation Age of Onset  . Hypertension Mother        died of cardiac arrest age 87  . Heart attack Mother   . Other Father        natural causes  . Colon cancer Daughter        died age 28    Allergies  Allergen Reactions  . Augmentin [Amoxicillin-Pot Clavulanate] Diarrhea    Current Outpatient Medications on File Prior to Visit  Medication Sig Dispense Refill  . acetaminophen (TYLENOL) 325 MG tablet Take 650 mg by mouth every 4 (four) hours as needed.    [name] amLODipine (NORVASC) 5 MG tablet Take 1 tablet (5 mg total) by mouth daily. 30 tablet 11  . atorvastatin (LIPITOR) 10 MG tablet Take 1 tablet (10 mg total) by mouth daily. 90 tablet 1  . brimonidine-timolol (COMBIGAN) 0.2-0.5 % ophthalmic solution Place 1 drop into both eyes twice a day    . furosemide (LASIX) 20 MG tablet Take 1 tablet (20 mg total) by mouth daily as needed for edema. 30 tablet 1  . gabapentin (NEURONTIN) 100 MG  capsule Take 1 capsule in morning for 7 days, then 2 capsules in morning for 7 days, then 3 capsules in morning. 90 capsule 0  . gabapentin (NEURONTIN) 300 MG capsule Take 1 capsule (300 mg total) by mouth at bedtime. 90 capsule 1  . HYDROcodone-acetaminophen (NORCO/VICODIN) 5-325 MG tablet Take 1 tablet by mouth every 6 (six) hours as needed for moderate pain. 12 tablet 0  . loteprednol (LOTEMAX) 0.5 % ophthalmic suspension Place 1 drop into the right eye.     . metoprolol succinate (TOPROL-XL) 25 MG 24 hr tablet Take 1 tablet (25 mg total) by mouth daily. Please schedule yearly appointment for anymore refills, thanks! [phone] 90 tablet 1  . mirabegron ER (MYRBETRIQ) 25 MG TB24 tablet Take 1 tablet (25 mg total) by mouth daily. For overactive bladder 90 tablet 1  . pantoprazole (PROTONIX) 40 MG tablet Take 1 tablet (40 mg total) daily by mouth. 90 tablet 1  . prednisoLONE acetate (PRED FORTE) 1 % ophthalmic suspension  Place 1 drop into the left eye twice a day     No current facility-administered medications on file prior to visit.     BP 120/66 (BP Location: Right Arm, Patient Position: Sitting, Cuff Size: Normal)   Pulse 68   Ht 5\' 4"  (1.626 m)   Wt 146 lb 3.2 oz (66.3 kg)   SpO2 100%   BMI 25.10 kg/m        Objective:   Physical Exam  Constitutional: She appears well-developed and well-nourished.  Cardiovascular: Normal rate, regular rhythm and normal heart sounds.  No murmur heard. Pulmonary/Chest: Effort normal and breath sounds normal. No respiratory distress. She has no wheezes.  Abdominal: Soft. Bowel sounds are normal. She exhibits no distension and no mass. There is no tenderness. There is no rebound.  Musculoskeletal:       Cervical back: She exhibits no tenderness.       Thoracic back: She exhibits no tenderness.       Lumbar back: She exhibits no tenderness.  Neurological:  Bilateral LE strength is 5/5  Psychiatric: She has a normal mood and affect. Her behavior is normal. Judgment and thought content normal.          Assessment & Plan:  Lumbar and thoracic radiculopathy- Patient with buring pain in the distribution of T11/T12 and L4/L5. Had MRI performed 04/15/16 which noted: Prominent spondylitic changes, most prominent at L4-5, asymmetric broad-based disc osteophyte bulge in the left side with severe left foraminal narrowing which was likely compressing on that left L4 and L5 nerve roots.  Note was also made of disc bulge at T10-T11.  Her findings are consistent with her known degenerative spinal disease.  Will give trial of meloxicam.  I have advised her let me know if symptoms are not improved in 1 week.  Constipation- pt is advised as follows:  Take 1 capful of miralax with 8 oz of water or juice today. Then take 1 tablespoon mixed with 8 oz of water, titrate up of down with goal of 1 soft BM/day. Drink plenty of water and lots of fresh produce.

## 2017-06-21 NOTE — Patient Instructions (Signed)
Take 1 capful of miralax with 8 oz of water or juice today. Then take 1 tablespoon mixed with 8 oz of water, titrate up of down with goal of 1 soft BM/day. Drink plenty of water and lots of fresh produce. Call if symptoms worsen or if not improved in 1 week.

## 2017-06-21 NOTE — Addendum Note (Signed)
Addended by: Kelle Darting A on: 06/21/2017 05:03 PM   Modules accepted: Orders

## 2017-07-12 DIAGNOSIS — G894 Chronic pain syndrome: Secondary | ICD-10-CM | POA: Diagnosis not present

## 2017-07-12 DIAGNOSIS — M519 Unspecified thoracic, thoracolumbar and lumbosacral intervertebral disc disorder: Secondary | ICD-10-CM | POA: Diagnosis not present

## 2017-07-12 DIAGNOSIS — M47892 Other spondylosis, cervical region: Secondary | ICD-10-CM | POA: Diagnosis not present

## 2017-07-12 DIAGNOSIS — M4802 Spinal stenosis, cervical region: Secondary | ICD-10-CM | POA: Diagnosis not present

## 2017-07-12 DIAGNOSIS — M9989 Other biomechanical lesions of abdomen and other regions: Secondary | ICD-10-CM | POA: Diagnosis not present

## 2017-07-13 DIAGNOSIS — Z87891 Personal history of nicotine dependence: Secondary | ICD-10-CM | POA: Diagnosis not present

## 2017-07-13 DIAGNOSIS — M4802 Spinal stenosis, cervical region: Secondary | ICD-10-CM | POA: Diagnosis not present

## 2017-07-13 DIAGNOSIS — M47812 Spondylosis without myelopathy or radiculopathy, cervical region: Secondary | ICD-10-CM | POA: Diagnosis not present

## 2017-07-13 DIAGNOSIS — E7800 Pure hypercholesterolemia, unspecified: Secondary | ICD-10-CM | POA: Diagnosis not present

## 2017-07-13 DIAGNOSIS — I10 Essential (primary) hypertension: Secondary | ICD-10-CM | POA: Diagnosis not present

## 2017-07-13 DIAGNOSIS — G894 Chronic pain syndrome: Secondary | ICD-10-CM | POA: Diagnosis not present

## 2017-07-15 ENCOUNTER — Encounter: Payer: Self-pay | Admitting: Family

## 2017-07-15 ENCOUNTER — Ambulatory Visit (INDEPENDENT_AMBULATORY_CARE_PROVIDER_SITE_OTHER): Payer: Medicare Other | Admitting: Family

## 2017-07-15 VITALS — BP 137/75 | HR 69 | Temp 98.8°F | Resp 16 | Ht 62.0 in | Wt 150.6 lb

## 2017-07-15 DIAGNOSIS — K5900 Constipation, unspecified: Secondary | ICD-10-CM

## 2017-07-15 DIAGNOSIS — I10 Essential (primary) hypertension: Secondary | ICD-10-CM | POA: Diagnosis not present

## 2017-07-15 DIAGNOSIS — E785 Hyperlipidemia, unspecified: Secondary | ICD-10-CM | POA: Diagnosis not present

## 2017-07-15 DIAGNOSIS — K219 Gastro-esophageal reflux disease without esophagitis: Secondary | ICD-10-CM | POA: Diagnosis not present

## 2017-07-15 DIAGNOSIS — N3281 Overactive bladder: Secondary | ICD-10-CM | POA: Diagnosis not present

## 2017-07-15 DIAGNOSIS — Z111 Encounter for screening for respiratory tuberculosis: Secondary | ICD-10-CM | POA: Diagnosis not present

## 2017-07-15 LAB — COMPREHENSIVE METABOLIC PANEL
ALT: 34 U/L (ref 0–35)
AST: 24 U/L (ref 0–37)
Albumin: 3.8 g/dL (ref 3.5–5.2)
Alkaline Phosphatase: 40 U/L (ref 39–117)
BUN: 20 mg/dL (ref 6–23)
CHLORIDE: 108 meq/L (ref 96–112)
CO2: 29 meq/L (ref 19–32)
CREATININE: 0.87 mg/dL (ref 0.40–1.20)
Calcium: 9.3 mg/dL (ref 8.4–10.5)
GFR: 78.95 mL/min (ref >60.00)
Glucose, Bld: 82 mg/dL (ref 70–99)
Potassium: 3.8 mEq/L (ref 3.5–5.1)
SODIUM: 144 meq/L (ref 135–145)
Total Bilirubin: 0.3 mg/dL (ref 0.2–1.2)
Total Protein: 6.6 g/dL (ref 6.0–8.3)

## 2017-07-15 LAB — LIPID PANEL
CHOL/HDL RATIO: 2
Cholesterol: 173 mg/dL (ref 0–200)
HDL: 82.5 mg/dL (ref >39.00)
LDL CALC: 79 mg/dL (ref 0–99)
NonHDL: 90.34
Triglycerides: 55 mg/dL (ref 0.0–149.0)
VLDL: 11 mg/dL (ref 0.0–40.0)

## 2017-07-15 MED ORDER — MIRABEGRON ER 25 MG PO TB24: 25.0000 mg | ORAL_TABLET | Freq: Every day | ORAL | 1 refills | Status: DC

## 2017-07-15 NOTE — Patient Instructions (Addendum)
Please make sure you are taking they Myrbetriq (for overactive bladder). Complete lab work prior to leaving.

## 2017-07-15 NOTE — Progress Notes (Signed)
Subjective:    Patient ID: Mary Trevino, female    DOB: Sep 17, 1926, 84 y.o.   MRN: [id number]  HPI  Mary Trevino is an 84 yr old female who presents today for follow up.   Constipation- last visit we recommended addition of miralax/increase in water and fresh produce. Reports improvement in her symptoms with miralax.   HTN- maintained on amlodipine.   BP Readings from Last 3 Encounters:  07/15/17 137/75  06/21/17 120/66  04/27/17 94/60   Hyperlipidemia-  Denies myalgia.  Lab Results  Component Value Date   CHOL 166 12/23/2015   HDL 64.70 12/23/2015   LDLCALC 83 12/23/2015   TRIG 94.0 12/23/2015   CHOLHDL 3 12/23/2015   Overactive bladder- thinks she might have run out of myrbetriq. Got up multiple times last night to urinate. No issues this AM.    GERD- maintained on protonix. GERD symptoms are stable but notes some belching.   Review of Systems    see HPI  Past Medical History:  Diagnosis Date  . Glaucoma   . Headache   . Hyperlipidemia   . Hypertension   . Subarachnoid hemorrhage ([place]) 12/2015     Social History   Socioeconomic History  . Marital status: Widowed    Spouse name: Not on file  . Number of children: 3  . Years of education: HS  . Highest education level: Not on file  Occupational History  . 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 Financial resource strain: Not on file  . Food insecurity:    Worry: Not on file    Inability: Not on file  . Transportation needs:    Medical: Not on file    Non-medical: Not on file  Tobacco Use  . Smoking status: Former Smoker    Years: 10.00  . Smokeless tobacco: Never Used  Substance and Sexual Activity  . Alcohol use: No    Alcohol/week: 0.0 oz  . Drug use: No  . Sexual activity: Not on file  Lifestyle  . Physical activity:    Days per week: Not on file    Minutes per session: Not on file  . Stress: Not on file  Relationships  . Social connections:    Talks on phone: Not on file    Gets together:  Not on file    Attends religious service: Not on file    Active member of club or organization: Not on file    Attends meetings of clubs or organizations: Not on file    Relationship status: Not on file  . Intimate partner violence:    Fear of current or ex partner: Not on file    Emotionally abused: Not on file    Physically abused: Not on file    Forced sexual activity: Not on file  Other Topics Concern  . 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   No caffeine use.    Past Surgical History:  Procedure Laterality Date  . ABDOMINAL HYSTERECTOMY  1975  . CORNEAL TRANSPLANT     2016 (left) 2014 (right)     Family History  Problem Relation Age of Onset  . Hypertension Mother        died of cardiac arrest  age 25  . Heart attack Mother   . Other Father        natural causes  . Colon cancer Daughter        died age 72    Allergies  Allergen Reactions  . Augmentin [Amoxicillin-Pot Clavulanate] Diarrhea    Current Outpatient Medications on File Prior to Visit  Medication Sig Dispense Refill  . acetaminophen (TYLENOL) 325 MG tablet Take 650 mg by mouth every 4 (four) hours as needed.    [name] amLODipine (NORVASC) 5 MG tablet Take 1 tablet (5 mg total) by mouth daily. 30 tablet 11  . atorvastatin (LIPITOR) 10 MG tablet Take 1 tablet (10 mg total) by mouth daily. 90 tablet 1  . brimonidine-timolol (COMBIGAN) 0.2-0.5 % ophthalmic solution Place 1 drop into both eyes twice a day    . furosemide (LASIX) 20 MG tablet Take 1 tablet (20 mg total) by mouth daily as needed for edema. 30 tablet 1  . gabapentin (NEURONTIN) 100 MG capsule Take 1 capsule in morning for 7 days, then 2 capsules in morning for 7 days, then 3 capsules in morning. 90 capsule 0  . gabapentin  (NEURONTIN) 300 MG capsule Take 1 capsule (300 mg total) by mouth at bedtime. 90 capsule 1  . HYDROcodone-acetaminophen (NORCO/VICODIN) 5-325 MG tablet Take 1 tablet by mouth every 6 (six) hours as needed for moderate pain. 12 tablet 0  . loteprednol (LOTEMAX) 0.5 % ophthalmic suspension Place 1 drop into the right eye.     . meloxicam (MOBIC) 7.5 MG tablet Take 1 tablet (7.5 mg total) by mouth daily. 14 tablet 0  . metoprolol succinate (TOPROL-XL) 25 MG 24 hr tablet Take 1 tablet (25 mg total) by mouth daily. Please schedule yearly appointment for anymore refills, thanks! [phone] 90 tablet 1  . mirabegron ER (MYRBETRIQ) 25 MG TB24 tablet Take 1 tablet (25 mg total) by mouth daily. For overactive bladder 90 tablet 1  . pantoprazole (PROTONIX) 40 MG tablet Take 1 tablet (40 mg total) daily by mouth. 90 tablet 1  . prednisoLONE acetate (PRED FORTE) 1 % ophthalmic suspension Place 1 drop into the left eye twice a day     No current facility-administered medications on file prior to visit.     BP 137/75 (BP Location: Right Arm, Patient Position: Sitting, Cuff Size: Normal)   Pulse 69   Temp 98.8 F (37.1 C) (Oral)   Resp 16   Ht 5\' 2"  (1.575 m)   Wt 150 lb 9.6 oz (68.3 kg)   SpO2 100%   BMI 27.55 kg/m    Objective:   Physical Exam  Constitutional: She is oriented to person, place, and time. She appears well-developed and well-nourished.  HENT:  Head: Normocephalic and atraumatic.  Cardiovascular: Normal rate, regular rhythm and normal heart sounds.  No murmur heard. Pulmonary/Chest: Effort normal and breath sounds normal. No respiratory distress. She has no wheezes.  Neurological: She is alert and oriented to person, place, and time.  Psychiatric: She has a normal mood and affect. Her behavior is normal. Judgment and thought content normal.          Assessment & Plan:  Overactive bladder- uncontrolled. Restart myrbetriq.  HTN- BP stable, continue current meds.  GERD-  stable on PPI, continue same.  Hyperlipidemia- tolerating statin, obtain follow up lipid panel.  Constipation- improved with prn miralax.   She also has paperwork with her today [facility]) for an ALF where she will stay for a few days while  her daughters are out of town. Check TB gold.

## 2017-07-16 LAB — QUANTIFERON-TB GOLD PLUS
Mitogen-NIL: >10 IU/mL
NIL: 0.01 [IU]/mL
QUANTIFERON-TB GOLD PLUS: NEGATIVE
TB1-NIL: 0 IU/mL
TB2-NIL: 0 [IU]/mL

## 2017-07-18 ENCOUNTER — Telehealth: Payer: Self-pay

## 2017-07-18 NOTE — Telephone Encounter (Signed)
PA approved through 04/11/2098

## 2017-07-20 ENCOUNTER — Telehealth: Payer: Self-pay | Admitting: *Deleted

## 2017-07-20 NOTE — Telephone Encounter (Signed)
Per provider instructions, attached TB Quantiferon-TB result and faxed FL2 to [facility] on Tues, 07/19/17 with confirmation/SLS 04/10

## 2017-07-20 NOTE — Telephone Encounter (Signed)
Daughter calling about FL2 form and lab for tb.  Melissa stated that she signed on Monday and sent to [facility].  Please call daughter to update her on forms.    Caryn Section [phone]

## 2017-07-21 NOTE — Telephone Encounter (Signed)
LMOM [daughter, [name]] with contact name and number for return call, if needed RE: FL2 and Result of TB-Quantiferon test per provider instructions on 07/18/17 and faxed paperwork to Facility listed, [facility] in [place]/SLS

## 2017-07-22 NOTE — Telephone Encounter (Signed)
Daughter, [name], came in to office today  And picked up hard copy of FL2 and TB-Quantiferon test result printout/SLS 04/12

## 2017-08-01 ENCOUNTER — Telehealth: Payer: Self-pay | Admitting: Family

## 2017-08-01 NOTE — Telephone Encounter (Signed)
Copied from [place] [id number]. Topic: Quick Communication - Rx Refill/Question >> Aug 01, 2017 11:58 AM Ether Griffins B wrote: Medication: metoprolol succinate (TOPROL-XL) 25 MG 24 hr tablet Has the patient contacted their pharmacy? Yes.    (Agent: If no, request that the patient contact the pharmacy for the refill.) Preferred Pharmacy (with phone number or street name): [facility], [place]: Please be advised that RX refills may take up to 3 business days. We ask that you follow-up with your pharmacy.

## 2017-08-02 ENCOUNTER — Telehealth: Payer: Self-pay | Admitting: Family

## 2017-08-02 ENCOUNTER — Other Ambulatory Visit: Payer: Self-pay | Admitting: *Deleted

## 2017-08-02 DIAGNOSIS — R072 Precordial pain: Secondary | ICD-10-CM

## 2017-08-02 DIAGNOSIS — R55 Syncope and collapse: Secondary | ICD-10-CM

## 2017-08-02 DIAGNOSIS — E785 Hyperlipidemia, unspecified: Secondary | ICD-10-CM

## 2017-08-02 DIAGNOSIS — R600 Localized edema: Secondary | ICD-10-CM

## 2017-08-02 MED ORDER — METOPROLOL SUCCINATE ER 25 MG PO TB24: 25.0000 mg | ORAL_TABLET | Freq: Every day | ORAL | 1 refills | Status: DC

## 2017-08-02 NOTE — Telephone Encounter (Signed)
Copied from [place] [id number]. Topic: Quick Communication - See Telephone Encounter >> Aug 02, 2017  1:00 PM Neva Seat wrote: Catheryn Bacon [place] [phone]  Faxed request for an order to take the following  Rx's missing on pt's FL2 amLODipine (NORVASC) 5 MG tablet Lidocaine 5 % patch  Order for pt to self administer her medications.

## 2017-08-02 NOTE — Telephone Encounter (Signed)
Rx refilled- patient seen 07/15/17, has appointment 09/07/17 and is scheduled in October as well.

## 2017-08-02 NOTE — Telephone Encounter (Signed)
Mary Trevino-- please advise below request?

## 2017-08-02 NOTE — Telephone Encounter (Signed)
Ivin Booty-- please let me know if you don't see these come through.

## 2017-08-02 NOTE — Telephone Encounter (Signed)
Copied from [place] [id number]. Topic: Quick Communication - Rx Refill/Question >> Aug 02, 2017  3:48 PM Margot Ables wrote: Medication: furosemide (LASIX) 20 MG tablet - pt states that she is now using lasix daily for leg swelling - I advised it was last ordered in 2017 by a different provider. Pt requesting 90 day supply be sent to [facility].

## 2017-08-03 ENCOUNTER — Telehealth: Payer: Self-pay | Admitting: *Deleted

## 2017-08-03 ENCOUNTER — Ambulatory Visit: Payer: Medicare Other | Admitting: Neurology

## 2017-08-03 MED ORDER — FUROSEMIDE 20 MG PO TABS: 20.0000 mg | ORAL_TABLET | Freq: Every day | ORAL | 1 refills | Status: DC | PRN

## 2017-08-03 NOTE — Telephone Encounter (Signed)
Melissa-- FYI if you see this come through

## 2017-08-03 NOTE — Telephone Encounter (Signed)
Received Physician Orders from [place] [x2]; forwarded to provider/SLS 04/24

## 2017-08-09 ENCOUNTER — Other Ambulatory Visit: Payer: Self-pay | Admitting: Family

## 2017-08-09 DIAGNOSIS — R55 Syncope and collapse: Secondary | ICD-10-CM

## 2017-08-11 DIAGNOSIS — M9989 Other biomechanical lesions of abdomen and other regions: Secondary | ICD-10-CM | POA: Diagnosis not present

## 2017-08-11 DIAGNOSIS — M4800 Spinal stenosis, site unspecified: Secondary | ICD-10-CM | POA: Diagnosis not present

## 2017-08-11 DIAGNOSIS — M25512 Pain in left shoulder: Secondary | ICD-10-CM | POA: Diagnosis not present

## 2017-08-11 DIAGNOSIS — M4802 Spinal stenosis, cervical region: Secondary | ICD-10-CM | POA: Diagnosis not present

## 2017-08-11 DIAGNOSIS — M47892 Other spondylosis, cervical region: Secondary | ICD-10-CM | POA: Diagnosis not present

## 2017-08-11 DIAGNOSIS — M519 Unspecified thoracic, thoracolumbar and lumbosacral intervertebral disc disorder: Secondary | ICD-10-CM | POA: Diagnosis not present

## 2017-08-11 DIAGNOSIS — G8929 Other chronic pain: Secondary | ICD-10-CM | POA: Diagnosis not present

## 2017-08-11 DIAGNOSIS — G894 Chronic pain syndrome: Secondary | ICD-10-CM | POA: Diagnosis not present

## 2017-08-19 NOTE — Telephone Encounter (Signed)
Conversation  (Newest Message First)  Me    08/03/17 3:32 PM  Note    Received Physician Orders from [place] [x2]; forwarded to provider/SLS 04/24

## 2017-08-20 ENCOUNTER — Other Ambulatory Visit: Payer: Self-pay | Admitting: Family

## 2017-08-22 DIAGNOSIS — H4089 Other specified glaucoma: Secondary | ICD-10-CM | POA: Diagnosis not present

## 2017-08-22 DIAGNOSIS — Z947 Corneal transplant status: Secondary | ICD-10-CM | POA: Diagnosis not present

## 2017-09-02 ENCOUNTER — Telehealth: Payer: Self-pay | Admitting: *Deleted

## 2017-09-02 MED ORDER — AMLODIPINE BESYLATE 5 MG PO TABS: 5.0000 mg | ORAL_TABLET | Freq: Every day | ORAL | 0 refills | Status: DC

## 2017-09-02 NOTE — Telephone Encounter (Signed)
Received fax from [facility] that pt is completely out of BP medication and home BP was 158/90. States [facility] will not ship medication until 09/03/17 and pt is requesting rx to [facility] until she received mail order. Rx sent. Notified pt.

## 2017-09-06 ENCOUNTER — Telehealth: Payer: Self-pay | Admitting: *Deleted

## 2017-09-06 NOTE — Telephone Encounter (Signed)
Received [facility] Physician Orders from [facility], completed as much as possible; forwarded to provider/SLS 05/28

## 2017-09-07 ENCOUNTER — Encounter: Payer: Self-pay | Admitting: Family

## 2017-09-07 ENCOUNTER — Telehealth: Payer: Self-pay | Admitting: Family

## 2017-09-07 ENCOUNTER — Ambulatory Visit (HOSPITAL_BASED_OUTPATIENT_CLINIC_OR_DEPARTMENT_OTHER)
Admission: RE | Admit: 2017-09-07 | Discharge: 2017-09-07 | Disposition: A | Discharge status: Home / Self Care | Payer: Medicare Other | Source: Ambulatory Visit | Attending: Family | Admitting: Family

## 2017-09-07 ENCOUNTER — Ambulatory Visit (INDEPENDENT_AMBULATORY_CARE_PROVIDER_SITE_OTHER): Payer: Medicare Other | Admitting: Family

## 2017-09-07 VITALS — BP 139/63 | HR 83 | Temp 99.5°F | Resp 16 | Ht 62.0 in | Wt 148.0 lb

## 2017-09-07 DIAGNOSIS — R5383 Other fatigue: Secondary | ICD-10-CM

## 2017-09-07 DIAGNOSIS — I700 Atherosclerosis of aorta: Secondary | ICD-10-CM | POA: Diagnosis not present

## 2017-09-07 DIAGNOSIS — R350 Frequency of micturition: Secondary | ICD-10-CM

## 2017-09-07 DIAGNOSIS — R7989 Other specified abnormal findings of blood chemistry: Secondary | ICD-10-CM

## 2017-09-07 DIAGNOSIS — R51 Headache: Secondary | ICD-10-CM | POA: Diagnosis not present

## 2017-09-07 DIAGNOSIS — R0789 Other chest pain: Secondary | ICD-10-CM

## 2017-09-07 DIAGNOSIS — R599 Enlarged lymph nodes, unspecified: Secondary | ICD-10-CM | POA: Diagnosis not present

## 2017-09-07 DIAGNOSIS — R519 Headache, unspecified: Secondary | ICD-10-CM

## 2017-09-07 LAB — CBC WITH DIFFERENTIAL/PLATELET
BASOS ABS: 0 10*3/uL (ref 0.0–0.1)
Basophils Relative: 0.5 % (ref 0.0–3.0)
EOS ABS: 0.2 10*3/uL (ref 0.0–0.7)
Eosinophils Relative: 4.1 % (ref 0.0–5.0)
HCT: 38.4 % (ref 36.0–46.0)
Hemoglobin: 12.5 g/dL (ref 12.0–15.0)
LYMPHS ABS: 0.7 10*3/uL (ref 0.7–4.0)
LYMPHS PCT: 13 % (ref 12.0–46.0)
MCHC: 32.6 g/dL (ref 30.0–36.0)
MCV: 86.7 fl (ref 78.0–100.0)
MONO ABS: 0.6 10*3/uL (ref 0.1–1.0)
Monocytes Relative: 9.8 % (ref 3.0–12.0)
NEUTROS PCT: 72.6 % (ref 43.0–77.0)
Neutro Abs: 4.1 10*3/uL (ref 1.4–7.7)
Platelets: 221 10*3/uL (ref 150.0–400.0)
RBC: 4.42 Mil/uL (ref 3.87–5.11)
RDW: 14.9 % (ref 11.5–15.5)
WBC: 5.6 10*3/uL (ref 4.0–10.5)

## 2017-09-07 LAB — HEPATIC FUNCTION PANEL
ALT: 15 U/L (ref 0–35)
AST: 15 U/L (ref 0–37)
Albumin: 3.9 g/dL (ref 3.5–5.2)
Alkaline Phosphatase: 44 U/L (ref 39–117)
BILIRUBIN DIRECT: 0.1 mg/dL (ref 0.0–0.3)
BILIRUBIN TOTAL: 0.4 mg/dL (ref 0.2–1.2)
Total Protein: 6.9 g/dL (ref 6.0–8.3)

## 2017-09-07 LAB — BASIC METABOLIC PANEL
BUN: 14 mg/dL (ref 6–23)
CALCIUM: 9.6 mg/dL (ref 8.4–10.5)
CHLORIDE: 102 meq/L (ref 96–112)
CO2: 32 mEq/L (ref 19–32)
CREATININE: 0.85 mg/dL (ref 0.40–1.20)
GFR: 81.07 mL/min (ref >60.00)
Glucose, Bld: 106 mg/dL — ABNORMAL HIGH (ref 70–99)
POTASSIUM: 4.2 meq/L (ref 3.5–5.1)
SODIUM: 141 meq/L (ref 135–145)

## 2017-09-07 LAB — POC URINALSYSI DIPSTICK (AUTOMATED)
Bilirubin, UA: NEGATIVE
Blood, UA: NEGATIVE
GLUCOSE UA: NEGATIVE
Ketones, UA: NEGATIVE
Leukocytes, UA: NEGATIVE
NITRITE UA: NEGATIVE
PROTEIN UA: NEGATIVE
Spec Grav, UA: 1.015 (ref 1.010–1.025)
Urobilinogen, UA: 0.2 E.U./dL
pH, UA: 6 (ref 5.0–8.0)

## 2017-09-07 LAB — TSH: TSH: 0.87 u[IU]/mL (ref 0.35–4.50)

## 2017-09-07 MED ORDER — IOPAMIDOL (ISOVUE-370) INJECTION 76%
100.0000 mL | Freq: Once | INTRAVENOUS | Status: AC | PRN
  Administered 2017-09-07: 76 mL via INTRAVENOUS

## 2017-09-07 NOTE — Telephone Encounter (Signed)
Patient advised of abnormal labs and to be aware of [place] calling back for CT scan this afternoon.

## 2017-09-07 NOTE — Progress Notes (Signed)
Subjective:    Patient ID: Mary Trevino, female    DOB: 1927-03-22, 84 y.o.   MRN: [id number]  HPI  Pt is an 84 yr old female who presents today with chief complaint of fatigue- c/o fatigue. Also reports intermittent dizziness and HA.  Of note, she saw Neuro for HA back in 1/18 and underwent MRI of the brain. MRI noted:  "Unremarkable MRI scan of the brain showing expected changes of remote age blood products over the right convexity from prior subarachnoid hemorrhage and mild changes of age-appropriate chronic microvascular ischemia."  She reports that her HA is sometimes on the side, sometimes on the top. She reports weakness, dizziness with standing. Denies nausea.  Reports higher bp in the afternoon 150-160.  She reports some left sided chest discomfort, intermittent. Denies CP currently.  She denies dysuria.  She reports urinary frequency which is somewhat worse.    Review of Systems   See HPI  Past Medical History:  Diagnosis Date  . Glaucoma   . Headache   . Hyperlipidemia   . Hypertension   . Subarachnoid hemorrhage ([place]) 12/2015     Social History   Socioeconomic History  . Marital status: Widowed    Spouse name: Not on file  . Number of children: 3  . Years of education: HS  . Highest education level: Not on file  Occupational History  . 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 Financial resource strain: Not on file  . Food insecurity:    Worry: Not on file    Inability: Not on file  . Transportation needs:    Medical: Not on file    Non-medical: Not on file  Tobacco Use  . Smoking status: Former Smoker    Years: 10.00  . Smokeless tobacco: Never Used  Substance and Sexual Activity  . Alcohol use: No    Alcohol/week: 0.0 oz  . Drug use: No  . Sexual activity: Not on file  Lifestyle  . Physical activity:    Days per week: Not on file    Minutes per session: Not on file  . Stress: Not on file  Relationships  . Social connections:    Talks on phone: Not  on file    Gets together: Not on file    Attends religious service: Not on file    Active member of club or organization: Not on file    Attends meetings of clubs or organizations: Not on file    Relationship status: Not on file  . Intimate partner violence:    Fear of current or ex partner: Not on file    Emotionally abused: Not on file    Physically abused: Not on file    Forced sexual activity: Not on file  Other Topics Concern  . 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   No caffeine use.    Past Surgical History:  Procedure Laterality Date  . ABDOMINAL HYSTERECTOMY  1975  . CORNEAL TRANSPLANT     2016 (left) 2014 (right)     Family History  Problem Relation Age of Onset  . Hypertension Mother        died of cardiac arrest age 37  . Heart attack Mother   .  Other Father        natural causes  . Colon cancer Daughter        died age 69    Allergies  Allergen Reactions  . Augmentin [Amoxicillin-Pot Clavulanate] Diarrhea    Current Outpatient Medications on File Prior to Visit  Medication Sig Dispense Refill  . acetaminophen (TYLENOL) 325 MG tablet Take 650 mg by mouth every 4 (four) hours as needed.    [name] amLODipine (NORVASC) 5 MG tablet Take 1 tablet (5 mg total) by mouth daily. 30 tablet 0  . atorvastatin (LIPITOR) 10 MG tablet Take 1 tablet (10 mg total) by mouth daily. 90 tablet 1  . brimonidine-timolol (COMBIGAN) 0.2-0.5 % ophthalmic solution Place 1 drop into both eyes twice a day    . furosemide (LASIX) 20 MG tablet Take 1 tablet (20 mg total) by mouth daily as needed for edema. 90 tablet 1  . gabapentin (NEURONTIN) 100 MG capsule Take 1 capsule in morning for 7 days, then 2 capsules in morning for 7 days, then 3 capsules in morning. 90 capsule  0  . gabapentin (NEURONTIN) 300 MG capsule Take 1 capsule (300 mg total) by mouth at bedtime. 90 capsule 1  . HYDROcodone-acetaminophen (NORCO/VICODIN) 5-325 MG tablet Take 1 tablet by mouth every 6 (six) hours as needed for moderate pain. 12 tablet 0  . loteprednol (LOTEMAX) 0.5 % ophthalmic suspension Place 1 drop into the right eye.     . meloxicam (MOBIC) 7.5 MG tablet Take 1 tablet (7.5 mg total) by mouth daily. 14 tablet 0  . metoprolol succinate (TOPROL-XL) 25 MG 24 hr tablet Take 1 tablet (25 mg total) by mouth daily. 90 tablet 1  . mirabegron ER (MYRBETRIQ) 25 MG TB24 tablet Take 1 tablet (25 mg total) by mouth daily. For overactive bladder 90 tablet 1  . pantoprazole (PROTONIX) 40 MG tablet TAKE 1 TABLET DAILY 90 tablet 1  . prednisoLONE acetate (PRED FORTE) 1 % ophthalmic suspension Place 1 drop into the left eye twice a day     No current facility-administered medications on file prior to visit.     BP 139/63 (BP Location: Right Arm, Patient Position: Sitting, Cuff Size: Small)   Pulse 83   Temp 99.5 F (37.5 C) (Oral)   Resp 16   Ht 5\' 2"  (1.575 m)   Wt 148 lb (67.1 kg)   SpO2 99%   BMI 27.07 kg/m        Objective:   Physical Exam  Constitutional: She is oriented to person, place, and time. She appears well-developed and well-nourished.  Cardiovascular: Normal rate, regular rhythm and normal heart sounds.  No murmur heard. Pulmonary/Chest: Effort normal and breath sounds normal. No respiratory distress. She has no wheezes.  Musculoskeletal: She exhibits no edema.  Neurological: She is alert and oriented to person, place, and time.  Psychiatric: She has a normal mood and affect. Her behavior is normal. Judgment and thought content normal.          Assessment & Plan:  Fatigue- obtain CMET, CBC, TSH.  UA is unremarkable. Send for culture to rule out infection.   Atypical chest pain- denies current cp. EKG is performed and personally reviewed. It is also compared  to previous EKG. No acute changes- appears  Unchanged. She is advised to go to the ER if recurrent CP occurs. Will refer to cardiology for further evaluation and obtain D dimer to rule out PE.    [place]- refer back to neurology for  further evaluation.

## 2017-09-07 NOTE — Patient Instructions (Addendum)
Please complete lab work prior to leaving. We are going to refer you to cardiology for further evaluation of your chest pain. Please go to the ER if you develop recurrent chest pain or shortness of breath.  We will set you up to see neurology for your headache.

## 2017-09-07 NOTE — Telephone Encounter (Signed)
Radiology department advised labs are ready, they will call the patient asap to come in for ct.

## 2017-09-07 NOTE — Telephone Encounter (Signed)
Please contact pt and let her know that d dimer (screening for blood clot) is elevated. I would like her to have a stat CT chest today. BMET is pending, can we schedule CT for later this afternoon please?

## 2017-09-08 ENCOUNTER — Telehealth: Payer: Self-pay | Admitting: Family

## 2017-09-08 LAB — D-DIMER, QUANTITATIVE: D-Dimer, Quant: 0.65 mcg/mL FEU — ABNORMAL HIGH (ref <0.50)

## 2017-09-08 LAB — URINE CULTURE
MICRO NUMBER: 90646370
SPECIMEN QUALITY:: ADEQUATE

## 2017-09-08 NOTE — Telephone Encounter (Signed)
Left message to return call. [place] for pec to discuss with patient. Please schedule patient for office visit to see Debbrah Alar tomorrow 09/09/17.

## 2017-09-08 NOTE — Telephone Encounter (Signed)
Reviewed her below concerns.    I also reviewed her paperwork from [facility].    They are requesting PPD, tetanus and pneumovax.  Let's bring her into the office to see me again to address her ongoing medical concerns and update her shots so we can complete her paperwork for [facility].

## 2017-09-08 NOTE — Telephone Encounter (Signed)
Copied from [place] [id number]. Topic: General - Other >> Sep 08, 2017  9:13 AM Lennox Solders wrote: Reason for CRM: pt would like a nurse to call her back concerning the results of blood work and ct scan. Pt is concern because of abnormal blood work that she had a chest ct scan that was normal. Pt can not see the neurologist until July. Pt does not want to wait that long. Pt is on cancellation list. Pt saw [facility] yesterday

## 2017-09-09 ENCOUNTER — Telehealth: Payer: Self-pay

## 2017-09-09 ENCOUNTER — Emergency Department (HOSPITAL_BASED_OUTPATIENT_CLINIC_OR_DEPARTMENT_OTHER): Payer: Medicare Other

## 2017-09-09 ENCOUNTER — Encounter (HOSPITAL_BASED_OUTPATIENT_CLINIC_OR_DEPARTMENT_OTHER): Payer: Self-pay

## 2017-09-09 ENCOUNTER — Ambulatory Visit: Payer: Self-pay | Admitting: *Deleted

## 2017-09-09 ENCOUNTER — Emergency Department (HOSPITAL_BASED_OUTPATIENT_CLINIC_OR_DEPARTMENT_OTHER)
Admission: EM | Admit: 2017-09-09 | Discharge: 2017-09-09 | Disposition: A | Discharge status: Home / Self Care | Payer: Medicare Other | Attending: Emergency Medicine | Admitting: Emergency Medicine

## 2017-09-09 ENCOUNTER — Other Ambulatory Visit: Payer: Self-pay

## 2017-09-09 DIAGNOSIS — R51 Headache: Secondary | ICD-10-CM | POA: Diagnosis not present

## 2017-09-09 DIAGNOSIS — G44209 Tension-type headache, unspecified, not intractable: Secondary | ICD-10-CM | POA: Diagnosis not present

## 2017-09-09 DIAGNOSIS — I10 Essential (primary) hypertension: Secondary | ICD-10-CM | POA: Diagnosis not present

## 2017-09-09 DIAGNOSIS — Z87891 Personal history of nicotine dependence: Secondary | ICD-10-CM | POA: Insufficient documentation

## 2017-09-09 MED ORDER — TRAMADOL HCL 50 MG PO TABS: 50.0000 mg | ORAL_TABLET | Freq: Four times a day (QID) | ORAL | 0 refills | Status: DC | PRN

## 2017-09-09 NOTE — ED Triage Notes (Signed)
Pt c/o pain to top and rt side of head x2wks, denies headache states "pain to head". Denies fall/injury or n/v

## 2017-09-09 NOTE — Telephone Encounter (Signed)
Copied from [place] [id number]. Topic: General - Other >> Sep 08, 2017  9:13 AM Lennox Solders wrote: Reason for CRM: pt would like a nurse to call her back concerning the results of blood work and ct scan. Pt is concern because of abnormal blood work that she had a chest ct scan that was normal. Pt can not see the neurologist until July. Pt does not want to wait that long. Pt is on cancellation list. Pt saw [facility] yesterday >> Sep 08, 2017  2:41 PM Boyd Kerbs wrote: Pt. Spoke to neuro and they can not get her in until July,  She can not wait this long.  She still in top of head.   Please advise. Also wanting lab results >> Sep 09, 2017 10:32 AM Boyd Kerbs wrote: Pt. Is not feeling well and is asking doctor to please call her.

## 2017-09-09 NOTE — Telephone Encounter (Signed)
Noted  

## 2017-09-09 NOTE — ED Notes (Signed)
Patient transported to CT 

## 2017-09-09 NOTE — Telephone Encounter (Signed)
Please let pt know that her lab work looks ok.  I would like to see her on Monday as scheduled, however if her headache worsens over the weekend she should go to the ER.  I am not sure what is causing her fatigue but we can further evaluate on Monday.  I would recommend tylenol as needed for headache.

## 2017-09-09 NOTE — ED Provider Notes (Signed)
Emergency Department Provider Note   I have reviewed the triage vital signs and the nursing notes.   HISTORY  Chief Complaint Headache   HPI Mary Trevino is a 84 y.o. female with PMH of traumatic SAH, HLD, and HTN presents to the emergency department for evaluation of pain on the top of the head.  It is been constant for the past 2 weeks.  Patient denies any head trauma.  He denies any sudden onset, maximal intensity symptoms.  No fevers or chills.  She has seen her primary care physician who encouraged her to present to the emergency department if symptoms got worse.  She denies any worsening symptoms but states that her headache is persistent and so came in today.  Patient has baseline blindness in the right eye and significantly decreased vision in the left.  She denies any numbness, tingling, weakness.  Denies any speech changes or difficulty swallowing.  Patient is not anticoagulated.  Past Medical History:  Diagnosis Date  . Glaucoma   . Headache   . Hyperlipidemia   . Hypertension   . Subarachnoid hemorrhage ([place]) 12/2015    Patient Active Problem List   Diagnosis Date Noted  . Spinal stenosis in cervical region 12/03/2016  . Myofascial pain dysfunction syndrome 12/03/2016  . Overactive bladder 09/21/2016  . History of corneal transplant 08/19/2016  . Depression 06/21/2016  . Cervico-occipital neuralgia of the right side 04/22/2016  . Right lumbar radiculopathy 04/01/2016  . Headache 04/01/2016  . History of subarachnoid hemorrhage 12/23/2015  . Legally blind 11/12/2015  . HTN (hypertension) 09/10/2015  . Glaucoma 09/10/2015  . Hyperlipidemia 09/10/2015    Past Surgical History:  Procedure Laterality Date  . ABDOMINAL HYSTERECTOMY  1975  . CORNEAL TRANSPLANT     2016 (left) 2014 (right)     Current Outpatient Rx  . Order #: [id number] Class: Historical Med  . Order #: [id number] Class: Normal  . Order #: [id number] Class: Normal  . Order #: [id number] Class:  Historical Med  . Order #: [id number] Class: Normal  . Order #: [id number] Class: Normal  . Order #: [id number] Class: Normal  . Order #: [id number] Class: Print  . Order #: [id number] Class: Historical Med  . Order #: [id number] Class: Normal  . Order #: [id number] Class: Normal  . Order #: [id number] Class: Normal  . Order #: [id number] Class: Normal  . Order #: [id number] Class: Historical Med  . Order #: [id number] Class: Print    Allergies Augmentin [amoxicillin-pot clavulanate]  Family History  Problem Relation Age of Onset  . Hypertension Mother        died of cardiac arrest age 66  . Heart attack Mother   . Other Father        natural causes  . Colon cancer Daughter        died age 87    Social History Social History   Tobacco Use  . Smoking status: Former Smoker    Years: 10.00  . Smokeless tobacco: Never Used  Substance Use Topics  . Alcohol use: No    Alcohol/week: 0.0 oz  . Drug use: No    Review of Systems  Constitutional: No fever/chills Eyes: No visual changes. ENT: No sore throat. Cardiovascular: Denies chest pain. Respiratory: Denies shortness of breath. Gastrointestinal: No abdominal pain.  No nausea, no vomiting.  No diarrhea.  No constipation. Genitourinary: Negative for dysuria. Musculoskeletal: Negative for back pain. Skin: Negative for rash. Neurological: Negative for focal weakness or numbness. Positive HA.   10-point ROS otherwise negative.  ____________________________________________   PHYSICAL EXAM:  VITAL SIGNS: ED Triage Vitals  Enc Vitals Group     BP 09/09/17 1601 128/80     Pulse Rate 09/09/17 1601 85     Resp 09/09/17 1601 18     Temp 09/09/17 1601 99 F (37.2 C)     Temp Source 09/09/17 1601 Oral     SpO2 09/09/17 1601 98 %     Weight 09/09/17 1601 146 lb (66.2 kg)     Height 09/09/17 1601 5\' 4"  (1.626 m)     Pain Score 09/09/17 1604 7   Constitutional: Alert and oriented. Well appearing and in no acute distress. Eyes:  Conjunctivae are normal. Patient with baseline fixed pupil on the right with haziness over both lenses.  Head: Atraumatic. Nose: No congestion/rhinnorhea. Mouth/Throat: Mucous membranes are moist.  Neck: No stridor. Patient with mild paraspinous tenderness in the cervical spine.  No midline spine tenderness. Cardiovascular: Normal rate, regular rhythm. Good peripheral circulation. Grossly normal heart sounds.   Respiratory: Normal respiratory effort.  No retractions. Lungs CTAB. Gastrointestinal: Soft and nontender. No distention.  Musculoskeletal: No lower extremity tenderness nor edema. No gross deformities of extremities. Neurologic:  Normal speech and language. No gross focal neurologic deficits are appreciated.  Skin:  Skin is warm, dry and intact. No rash noted.  ____________________________________________  RADIOLOGY  Ct Head Wo Contrast  Result Date: 09/09/2017 CLINICAL DATA:  Headaches for 2 weeks and increasing fatigue EXAM: CT HEAD WITHOUT CONTRAST CT CERVICAL SPINE WITHOUT CONTRAST TECHNIQUE: Multidetector CT imaging of the head and cervical spine was performed following the standard protocol without intravenous contrast. Multiplanar CT image reconstructions of the cervical spine were also generated. COMPARISON:  12/18/2015 FINDINGS: CT HEAD FINDINGS Brain: Mild atrophic changes are again identified. Mild chronic white matter ischemic changes seen. Basal ganglia calcifications are noted bilaterally. No findings to suggest acute hemorrhage, acute infarction or space-occupying mass lesion are noted. Vascular: No hyperdense vessel or unexpected calcification. Skull: Normal. Negative for fracture or focal lesion. Sinuses/Orbits: Postsurgical changes are noted in the orbits bilaterally stable from the previous exam. Other: None CT CERVICAL SPINE FINDINGS Alignment: Within normal limits. Skull base and vertebrae: 7 cervical segments are well visualized. Vertebral body height is well  maintained. Osteophytic changes are noted from C4-C7. Multilevel facet hypertrophic changes are seen. No acute fracture or acute facet abnormality is noted. Soft tissues and spinal canal: No prevertebral fluid or swelling. No visible canal hematoma. Upper chest: Within normal limits. Other: None IMPRESSION: CT of the head: Chronic changes without acute abnormality. CT of the cervical spine: Multilevel degenerative change without acute abnormality. Electronically Signed   By: Inez Catalina M.D.   On: 09/09/2017 17:43   Ct Cervical Spine Wo Contrast  Result Date: 09/09/2017 CLINICAL DATA:  Headaches for 2 weeks and increasing fatigue EXAM: CT HEAD WITHOUT CONTRAST CT CERVICAL SPINE WITHOUT CONTRAST TECHNIQUE: Multidetector CT imaging of the head and cervical spine was performed following the standard protocol without intravenous contrast. Multiplanar CT image reconstructions of the cervical spine were also generated. COMPARISON:  12/18/2015 FINDINGS: CT HEAD FINDINGS Brain: Mild atrophic changes are again identified. Mild chronic white matter ischemic changes seen. Basal ganglia calcifications are noted bilaterally. No findings to suggest acute hemorrhage, acute infarction or space-occupying mass lesion are noted. Vascular: No hyperdense vessel or unexpected calcification. Skull: Normal. Negative for fracture or focal lesion. Sinuses/Orbits: Postsurgical changes are noted in the orbits bilaterally stable from the previous exam. Other: None CT CERVICAL SPINE FINDINGS Alignment: Within normal limits. Skull  base and vertebrae: 7 cervical segments are well visualized. Vertebral body height is well maintained. Osteophytic changes are noted from C4-C7. Multilevel facet hypertrophic changes are seen. No acute fracture or acute facet abnormality is noted. Soft tissues and spinal canal: No prevertebral fluid or swelling. No visible canal hematoma. Upper chest: Within normal limits. Other: None IMPRESSION: CT of the head:  Chronic changes without acute abnormality. CT of the cervical spine: Multilevel degenerative change without acute abnormality. Electronically Signed   By: Inez Catalina M.D.   On: 09/09/2017 17:43    ____________________________________________   PROCEDURES  Procedure(s) performed:   Procedures  None ____________________________________________   INITIAL IMPRESSION / ASSESSMENT AND PLAN / ED COURSE  Pertinent labs & imaging results that were available during my care of the patient were reviewed by me and considered in my medical decision making (see chart for details).  Patient presents to the emergency department with pain on the top of the head and some neck discomfort.  Symptoms have been constant for the last 2 weeks.  No fever to suggest infectious etiology.  No trauma.  Given the patient's age and no prior history of similar headaches plan for CT imaging of the head.   CT head and cervical spine negative for acute findings. Plan for Tramadol, which the patient has tolerated well in the past, and Neurology/PCP follow up.   At this time, I do not feel there is any life-threatening condition present. I have reviewed and discussed all results (EKG, imaging, lab, urine as appropriate), exam findings with patient. I have reviewed nursing notes and appropriate previous records.  I feel the patient is safe to be discharged home without further emergent workup. Discussed usual and customary return precautions. Patient and family (if present) verbalize understanding and are comfortable with this plan.  Patient will follow-up with their primary care provider. If they do not have a primary care provider, information for follow-up has been provided to them. All questions have been answered.  ____________________________________________  FINAL CLINICAL IMPRESSION(S) / ED DIAGNOSES  Final diagnoses:  Acute non intractable tension-type headache    NEW OUTPATIENT MEDICATIONS STARTED DURING  THIS VISIT:  Discharge Medication List as of 09/09/2017  6:12 PM    START taking these medications   Details  traMADol (ULTRAM) 50 MG tablet Take 1 tablet (50 mg total) by mouth every 6 (six) hours as needed., Starting Fri 09/09/2017, Print        Note:  This document was prepared using Dragon voice recognition software and may include unintentional dictation errors.  Nanda Quinton, MD Emergency Medicine    Long, [facility], MD 09/10/17 1053

## 2017-09-09 NOTE — Telephone Encounter (Signed)
Copied from [place] [id number]. Topic: General - Other >> Sep 08, 2017  9:13 AM Lennox Solders wrote: Reason for CRM: pt would like a nurse to call her back concerning the results of blood work and ct scan. Pt is concern because of abnormal blood work that she had a chest ct scan that was normal. Pt can not see the neurologist until July. Pt does not want to wait that long. Pt is on cancellation list. Pt saw [facility] yesterday >> Sep 08, 2017  2:41 PM Boyd Kerbs wrote: Pt. Spoke to neuro and they can not get her in until July,  She can not wait this long.  She still in top of head.   Please advise. Also wanting lab results >> Sep 09, 2017 10:32 AM Boyd Kerbs wrote: Pt. Is not feeling well and is asking doctor to please call her.

## 2017-09-09 NOTE — Telephone Encounter (Signed)
Called patient no answer. Called 2nd number we have listed, daughter staters patient is currently using her phone. Left message with daughter to have patient return my call. Daughter agreed.

## 2017-09-09 NOTE — Discharge Instructions (Signed)
You have been seen in the Emergency Department (ED) for a headache.  Please use Tylenol ss needed for symptoms, but only as written on the box.   Only take the Tramadol for severe pain. This may cause constipation so take a stool softener while using this medication. Call to schedule the follow up appointment with the Neurologist.   As we have discussed, please follow up with your primary care doctor as soon as possible regarding today?s Emergency Department (ED) visit and your headache symptoms.    Call your doctor or return to the ED if you have a worsening headache, sudden and severe headache, confusion, slurred speech, facial droop, weakness or numbness in any arm or leg, extreme fatigue, vision problems, or other symptoms that concern you.

## 2017-09-09 NOTE — Telephone Encounter (Signed)
Pt called with having fatigue and pain in her head. She states this is not a headache but a pain in the top of her head mostly. She took Tylenol but it did not help her much.  She denies nausea, vomiting, no new problems with her eyes (has cataract in one eye and blind and the other eye). No fever and trouble ambulating. Going on now for a couple of weeks.  She has a stiff neck but can move it and touch her chin. She already has an appointment scheduled with her pcp on Monday. She would like a call back regarding what else she can take for her head and the results of her lab from 09/07/17. Will route to flow at [id number] [facility] at [facility].  Reason for Disposition . Headache is a chronic symptom (recurrent or ongoing AND present > 4 weeks)  Answer Assessment - Initial Assessment Questions 1. LOCATION: "Where does it hurt?"      Top of her head and sometimes on the sides 2. ONSET: "When did the headache start?" (Minutes, hours or days)      A couple of weeks 3. PATTERN: "Does the pain come and go, or has it been constant since it started?"     Comes and goes. Sometime will stay an half hour. 4. SEVERITY: "How bad is the pain?" and "What does it keep you from doing?"  (e.g., Scale 1-10; mild, moderate, or severe)   - MILD (1-3): doesn't interfere with normal activities    - MODERATE (4-7): interferes with normal activities or awakens from sleep    - SEVERE (8-10): excruciating pain, unable to do any normal activities        Pain #10 when it comes 5. RECURRENT SYMPTOM: "Have you ever had headaches before?" If so, ask: "When was the last time?" and "What happened that time?"      no 6. CAUSE: "What do you think is causing the headache?"     Not sure 7. MIGRAINE: "Have you been diagnosed with migraine headaches?" If so, ask: "Is this headache similar?"      n/a 8. HEAD INJURY: "Has there been any recent injury to the head?"      Hit head when fell out of bed around last September 9. OTHER  SYMPTOMS: "Do you have any other symptoms?" (fever, stiff neck, eye pain, sore throat, cold symptoms)     Stiff neck 10. PREGNANCY: "Is there any chance you are pregnant?" "When was your last menstrual period?"       n/a  Protocols used: HEADACHE-A-AH

## 2017-09-09 NOTE — Telephone Encounter (Signed)
Mary Trevino spoke to the patient already and she is coming in on Monday for follow up.  Thanks.

## 2017-09-09 NOTE — Telephone Encounter (Signed)
Pt states she is not aware of any of the below request from [place]. States she had already visited the assisted living facility and is not aware that she is going back anytime soon.

## 2017-09-09 NOTE — ED Notes (Signed)
ED Provider at bedside, spoke at length regarding test results and follow up care. Son at bedside

## 2017-09-09 NOTE — Telephone Encounter (Signed)
Notified pt and she voices understanding. States she is aware of the appointment for Monday and will keep that. Will go to ER if headache becomes severe over the weekend.

## 2017-09-09 NOTE — Telephone Encounter (Signed)
Returned patients phone call. No answer called 2nd phone number we have listed daughter answered. States mother is currently on her phone but will have her call office back.

## 2017-09-09 NOTE — Telephone Encounter (Signed)
Patient has been scheduled for Monday 09/12/17 with PCP.

## 2017-09-09 NOTE — Telephone Encounter (Signed)
Ok thanks 

## 2017-09-12 ENCOUNTER — Telehealth: Payer: Self-pay

## 2017-09-12 ENCOUNTER — Ambulatory Visit (INDEPENDENT_AMBULATORY_CARE_PROVIDER_SITE_OTHER): Payer: Medicare Other | Admitting: Family

## 2017-09-12 ENCOUNTER — Encounter: Payer: Self-pay | Admitting: Family

## 2017-09-12 VITALS — BP 114/67 | HR 65 | Temp 98.5°F | Resp 18 | Ht 62.0 in | Wt 151.2 lb

## 2017-09-12 DIAGNOSIS — R6883 Chills (without fever): Secondary | ICD-10-CM | POA: Diagnosis not present

## 2017-09-12 DIAGNOSIS — R51 Headache: Secondary | ICD-10-CM

## 2017-09-12 DIAGNOSIS — R599 Enlarged lymph nodes, unspecified: Secondary | ICD-10-CM

## 2017-09-12 DIAGNOSIS — R519 Headache, unspecified: Secondary | ICD-10-CM

## 2017-09-12 LAB — TSH: TSH: 1.12 u[IU]/mL (ref 0.35–4.50)

## 2017-09-12 MED ORDER — GABAPENTIN 300 MG PO CAPS: 300.0000 mg | ORAL_CAPSULE | Freq: Two times a day (BID) | ORAL | 1 refills | Status: DC

## 2017-09-12 NOTE — Progress Notes (Signed)
See 09/09/17 nurse triage note. This has been addressed and pt is being seen today to discuss further.

## 2017-09-12 NOTE — Patient Instructions (Signed)
Please complete lab work prior to leaving. Increase gabapentin to twice daily.  Keep your upcoming appointment with neurology.

## 2017-09-12 NOTE — Progress Notes (Signed)
Subjective:    Patient ID: Mary Trevino, female    DOB: [birth year], 84 y.o.   MRN: [id number]  HPI  Ms. Mary Trevino is an 84 yr old female who presents today for follow up.  I last saw the patient on 09/07/2017.  At that visit she complained of fatigue intermittent dizziness and headache.  Complete metabolic panel, CBC TSH and urinalysis/culture were performed.  This lab work was unremarkable.  She did have an elevated d-dimer.  CT scan was performed which was negative for pulmonary embolus.  Incidental finding was made of a slightly enlarged single abnormal prevascular lymph node.  Size of this lymph node was 11 mm.   She was seen in the emergency department on 09/09/2017.  This was due to acute non-intractable headache.  Work-up included a CT scan of the cervical spine.  This noted multilevel degenerative changes without acute abnormality.  CT scan of the head noted chronic changes without acute abnormality.  She was given a prescription for tramadol. She is scheduled to see neurology on June 20 for headache.   Today she reports ongoing pain in the right scalp/top of her head.   She complains of "chills" and cold intolerance- Review of Systems    see HPI  Past Medical History:  Diagnosis Date  . Glaucoma   . Headache   . Hyperlipidemia   . Hypertension   . Subarachnoid hemorrhage ([place]) 12/2015     Social History   Socioeconomic History  . Marital status: Widowed    Spouse name: Not on file  . Number of children: 3  . Years of education: HS  . Highest education level: Not on file  Occupational History  . 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 Financial resource strain: Not on file  . Food insecurity:    Worry: Not on file    Inability: Not on file  . Transportation needs:    Medical: Not on file    Non-medical: Not on file  Tobacco Use  . Smoking status: Former Smoker    Years: 10.00  . Smokeless tobacco: Never Used  Substance and Sexual Activity  . Alcohol use: No   Alcohol/week: 0.0 oz  . Drug use: No  . Sexual activity: Not on file  Lifestyle  . Physical activity:    Days per week: Not on file    Minutes per session: Not on file  . Stress: Not on file  Relationships  . Social connections:    Talks on phone: Not on file    Gets together: Not on file    Attends religious service: Not on file    Active member of club or organization: Not on file    Attends meetings of clubs or organizations: Not on file    Relationship status: Not on file  . Intimate partner violence:    Fear of current or ex partner: Not on file    Emotionally abused: Not on file    Physically abused: Not on file    Forced sexual activity: Not on file  Other Topics Concern  . 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   No caffeine use.    Past Surgical History:  Procedure Laterality Date  .  ABDOMINAL HYSTERECTOMY  1975  . CORNEAL TRANSPLANT     2016 (left) 2014 (right)     Family History  Problem Relation Age of Onset  . Hypertension Mother        died of cardiac arrest age 39  . Heart attack Mother   . Other Father        natural causes  . Colon cancer Daughter        died age 48    Allergies  Allergen Reactions  . Augmentin [Amoxicillin-Pot Clavulanate] Diarrhea    Current Outpatient Medications on File Prior to Visit  Medication Sig Dispense Refill  . acetaminophen (TYLENOL) 325 MG tablet Take 650 mg by mouth every 4 (four) hours as needed.    [name] amLODipine (NORVASC) 5 MG tablet Take 1 tablet (5 mg total) by mouth daily. 30 tablet 0  . atorvastatin (LIPITOR) 10 MG tablet Take 1 tablet (10 mg total) by mouth daily. 90 tablet 1  . brimonidine-timolol (COMBIGAN) 0.2-0.5 % ophthalmic solution Place 1 drop into both eyes twice a day    .  furosemide (LASIX) 20 MG tablet Take 1 tablet (20 mg total) by mouth daily as needed for edema. 90 tablet 1  . gabapentin (NEURONTIN) 100 MG capsule Take 1 capsule in morning for 7 days, then 2 capsules in morning for 7 days, then 3 capsules in morning. 90 capsule 0  . gabapentin (NEURONTIN) 300 MG capsule Take 1 capsule (300 mg total) by mouth at bedtime. 90 capsule 1  . HYDROcodone-acetaminophen (NORCO/VICODIN) 5-325 MG tablet Take 1 tablet by mouth every 6 (six) hours as needed for moderate pain. 12 tablet 0  . loteprednol (LOTEMAX) 0.5 % ophthalmic suspension Place 1 drop into the right eye.     . meloxicam (MOBIC) 7.5 MG tablet Take 1 tablet (7.5 mg total) by mouth daily. 14 tablet 0  . metoprolol succinate (TOPROL-XL) 25 MG 24 hr tablet Take 1 tablet (25 mg total) by mouth daily. 90 tablet 1  . mirabegron ER (MYRBETRIQ) 25 MG TB24 tablet Take 1 tablet (25 mg total) by mouth daily. For overactive bladder 90 tablet 1  . pantoprazole (PROTONIX) 40 MG tablet TAKE 1 TABLET DAILY 90 tablet 1  . prednisoLONE acetate (PRED FORTE) 1 % ophthalmic suspension Place 1 drop into the left eye twice a day    . traMADol (ULTRAM) 50 MG tablet Take 1 tablet (50 mg total) by mouth every 6 (six) hours as needed. 12 tablet 0   No current facility-administered medications on file prior to visit.     BP 114/67 (BP Location: Left Arm, Cuff Size: Normal)   Pulse 65   Temp 98.5 F (36.9 C) (Oral)   Resp 18   Ht 5\' 2"  (1.575 m)   Wt 151 lb 3.2 oz (68.6 kg)   SpO2 100%   BMI 27.65 kg/m    Objective:   Physical Exam  Constitutional: She is oriented to person, place, and time. She appears well-developed and well-nourished.  Cardiovascular: Normal rate, regular rhythm and normal heart sounds.  No murmur heard. Pulmonary/Chest: Effort normal and breath sounds normal. No respiratory distress. She has no wheezes.  Neurological: She is alert and oriented to person, place, and time. She exhibits normal muscle  tone.  Psychiatric: She has a normal mood and affect. Her behavior is normal. Judgment and thought content normal.          Assessment & Plan:  Enlarged prevascular lymph node- plan repeat CT in  3 months.   Headache- seems like it may be more of a neuralgia than HA.  Will increase her gabapentin to bid and have her follow up as scheduled with neurology.   Chills/cold intolerance- obtain follow up tsh.

## 2017-09-12 NOTE — Telephone Encounter (Signed)
Copied from [place] [id number]. Topic: General - Other >> Sep 08, 2017  9:13 AM Lennox Solders wrote: Reason for CRM: pt would like a nurse to call her back concerning the results of blood work and ct scan. Pt is concern because of abnormal blood work that she had a chest ct scan that was normal. Pt can not see the neurologist until July. Pt does not want to wait that long. Pt is on cancellation list. Pt saw [facility] yesterday >> Sep 08, 2017  2:41 PM Boyd Kerbs wrote: Pt. Spoke to neuro and they can not get her in until July,  She can not wait this long.  She still in top of head.   Please advise. Also wanting lab results >> Sep 09, 2017 10:32 AM Boyd Kerbs wrote: Pt. Is not feeling well and is asking doctor to please call her.

## 2017-09-13 ENCOUNTER — Telehealth: Payer: Self-pay

## 2017-09-13 NOTE — Telephone Encounter (Signed)
Copied from [place] [id number]. Topic: General - Other >> Sep 08, 2017  9:13 AM Lennox Solders wrote: Reason for CRM: pt would like a nurse to call her back concerning the results of blood work and ct scan. Pt is concern because of abnormal blood work that she had a chest ct scan that was normal. Pt can not see the neurologist until July. Pt does not want to wait that long. Pt is on cancellation list. Pt saw [facility] yesterday >> Sep 08, 2017  2:41 PM Boyd Kerbs wrote: Pt. Spoke to neuro and they can not get her in until July,  She can not wait this long.  She still in top of head.   Please advise. Also wanting lab results >> Sep 09, 2017 10:32 AM Boyd Kerbs wrote: Pt. Is not feeling well and is asking doctor to please call her.

## 2017-09-14 ENCOUNTER — Ambulatory Visit: Payer: Medicare Other | Admitting: Neurology

## 2017-09-15 ENCOUNTER — Encounter: Payer: Self-pay | Admitting: Cardiology

## 2017-09-15 ENCOUNTER — Encounter: Payer: Self-pay | Admitting: *Deleted

## 2017-09-15 ENCOUNTER — Ambulatory Visit (INDEPENDENT_AMBULATORY_CARE_PROVIDER_SITE_OTHER): Payer: Medicare Other | Admitting: Cardiology

## 2017-09-15 VITALS — BP 110/72 | HR 70 | Ht 64.0 in | Wt 148.8 lb

## 2017-09-15 DIAGNOSIS — E782 Mixed hyperlipidemia: Secondary | ICD-10-CM | POA: Diagnosis not present

## 2017-09-15 DIAGNOSIS — F329 Major depressive disorder, single episode, unspecified: Secondary | ICD-10-CM | POA: Diagnosis not present

## 2017-09-15 DIAGNOSIS — R0789 Other chest pain: Secondary | ICD-10-CM | POA: Diagnosis not present

## 2017-09-15 DIAGNOSIS — R079 Chest pain, unspecified: Secondary | ICD-10-CM

## 2017-09-15 DIAGNOSIS — I10 Essential (primary) hypertension: Secondary | ICD-10-CM

## 2017-09-15 DIAGNOSIS — F32A Depression, unspecified: Secondary | ICD-10-CM

## 2017-09-15 NOTE — Patient Instructions (Signed)
Medication Instructions:  Your physician recommends that you continue on your current medications as directed. Please refer to the Current Medication list given to you today.   Labwork: None  Testing/Procedures: Your physician has requested that you have an echocardiogram. Echocardiography is a painless test that uses sound waves to create images of your heart. It provides your doctor with information about the size and shape of your heart and how well your heart's chambers and valves are working. This procedure takes approximately one hour. There are no restrictions for this procedure.  Your physician has requested that you have a lexiscan myoview. For further information please visit [email or web address]. Please follow instruction sheet, as given.   Follow-Up: Your physician recommends that you schedule a follow-up appointment in: 1 month.  If you need a refill on your cardiac medications before your next appointment, please call your pharmacy.   Thank you for choosing [facility]! Robyne Peers, RN [phone]

## 2017-09-15 NOTE — Progress Notes (Signed)
Cardiology Consultation:    Date:  09/15/2017   ID:  Mary Trevino, DOB 1926-08-05, MRN [id number]  PCP:  Debbrah Alar, NP  Cardiologist:  Jenne Campus, MD   Referring MD: Debbrah Alar, NP   Chief Complaint  Patient presents with  . Chest Pain  I do not feel well  History of Present Illness:    Mary Trevino is a 84 y.o. female who is being seen today for the evaluation of atypical chest pain at the request of Debbrah Alar, NP.  She is a 83 years old female with multiple medical problems.  She was referred to [place] because of episode of chest pain.  Her chief complaint however today's I feel very weak and tired and nobody can tell me why.  In the matter-of-fact when I walked into the room she was sleeping on the examination table.  She tells me she does not feel well at the same time she goes to senior center and she likes to go there.  When asked what she likes the most about senior center she said exercises.  Described to have some pain located in the left side of her chest not related to exercise.  Typically that happens during the night.  She tells me that she has difficulty sleeping she has to get up many times during the night to go to the restroom.  Denies having any palpitations no swelling of lower extremities.  Does have some exertional shortness of breath.  She did have cardiac work-up done in 2017 which concluded that there was no heart trouble.  That was a stress test as well as echocardiogram.  She does have some risk factors for coronary artery disease namely dyslipidemia and hypertension as well as age.  She quit smoking cigarettes 30-40 years ago.  Past Medical History:  Diagnosis Date  . Glaucoma   . Headache   . Hyperlipidemia   . Hypertension   . Subarachnoid hemorrhage ([place]) 12/2015    Past Surgical History:  Procedure Laterality Date  . ABDOMINAL HYSTERECTOMY  1975  . CORNEAL TRANSPLANT     2016 (left) 2014 (right)     Current  Medications: Current Meds  Medication Sig  . acetaminophen (TYLENOL) 325 MG tablet Take 650 mg by mouth every 4 (four) hours as needed.  [name] amLODipine (NORVASC) 5 MG tablet Take 1 tablet (5 mg total) by mouth daily.  [name] atorvastatin (LIPITOR) 10 MG tablet Take 1 tablet (10 mg total) by mouth daily.  . brimonidine-timolol (COMBIGAN) 0.2-0.5 % ophthalmic solution Place 1 drop into both eyes twice a day  . furosemide (LASIX) 20 MG tablet Take 1 tablet (20 mg total) by mouth daily as needed for edema.  . gabapentin (NEURONTIN) 300 MG capsule Take 1 capsule (300 mg total) by mouth 2 (two) times daily.  [name] loteprednol (LOTEMAX) 0.5 % ophthalmic suspension Place 1 drop into the right eye.   . meloxicam (MOBIC) 7.5 MG tablet Take 1 tablet (7.5 mg total) by mouth daily.  . metoprolol succinate (TOPROL-XL) 25 MG 24 hr tablet Take 1 tablet (25 mg total) by mouth daily.  . pantoprazole (PROTONIX) 40 MG tablet TAKE 1 TABLET DAILY  . prednisoLONE acetate (PRED FORTE) 1 % ophthalmic suspension Place 1 drop into the left eye twice a day     Allergies:   Augmentin [amoxicillin-pot clavulanate]   Social History   Socioeconomic History  . Marital status: Widowed    Spouse name: Not on file  . Number of  children: 3  . Years of education: HS  . Highest education level: Not on file  Occupational History  . 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 Financial resource strain: Not on file  . Food insecurity:    Worry: Not on file    Inability: Not on file  . Transportation needs:    Medical: Not on file    Non-medical: Not on file  Tobacco Use  . Smoking status: Former Smoker    Years: 10.00  . Smokeless tobacco: Never Used  Substance and Sexual Activity  . Alcohol use: No    Alcohol/week: 0.0 oz  . Drug use: No  . Sexual activity: Not on file  Lifestyle  . Physical activity:    Days per week: Not on file    Minutes per session: Not on file  . Stress: Not on file  Relationships  . Social  connections:    Talks on phone: Not on file    Gets together: Not on file    Attends religious service: Not on file    Active member of club or organization: Not on file    Attends meetings of clubs or organizations: Not on file    Relationship status: Not on file  Other Topics Concern  . 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   No caffeine use.     Family History: The patient's family history includes Colon cancer in her daughter; Heart attack in her mother; Hypertension in her mother; Other in her father. ROS:   Please see the history of present illness.    All 14 point review of systems negative except as described per history of present illness.  EKGs/Labs/Other Studies Reviewed:    The following studies were reviewed today: EKG showed normal sinus rhythm normal P interval left axis deviation.  No acute ST segment changes    Recent Labs: 09/07/2017: ALT 15; BUN 14; Creatinine, Ser 0.85; Hemoglobin 12.5; Platelets 221.0; Potassium 4.2; Sodium 141 09/12/2017: TSH 1.12  Recent Lipid Panel    Component Value Date/Time   CHOL 173 07/15/2017 0944   TRIG 55.0 07/15/2017 0944   HDL 82.50 07/15/2017 0944   CHOLHDL 2 07/15/2017 0944   VLDL 11.0 07/15/2017 0944   LDLCALC 79 07/15/2017 0944    Physical Exam:    VS:  BP 110/72   Pulse 70   Ht 5\' 4"  (1.626 m)   Wt 148 lb 12.8 oz (67.5 kg)   SpO2 98%   BMI 25.54 kg/m     Wt Readings from Last 3 Encounters:  09/15/17 148 lb 12.8 oz (67.5 kg)  09/12/17 151 lb 3.2 oz (68.6 kg)  09/09/17 146 lb (66.2 kg)     GEN:  Well nourished, well developed in no acute distress HEENT: Normal NECK: No JVD; No carotid bruits LYMPHATICS: No lymphadenopathy CARDIAC: RRR, no murmurs, no rubs, no gallops RESPIRATORY:   Clear to auscultation without rales, wheezing or rhonchi  ABDOMEN: Soft, non-tender, non-distended MUSCULOSKELETAL:  No edema; No deformity  SKIN: Warm and dry NEUROLOGIC:  Alert and oriented x 3 PSYCHIATRIC:  Normal affect   ASSESSMENT:    1. Atypical chest pain   2. Depression, unspecified depression type   3. Essential hypertension  4. Mixed hyperlipidemia    PLAN:    In order of problems listed above:  1. Atypical chest pain.  She does have some risk factors for coronary artery disease but pain is very atypical since she does have constellation of very unclear symptoms for clarification would be reasonable to perform stress test I will ask her to have [place].  As a part of evaluation of her heart for her symptoms I think it would be reasonable to do echocardiogram as well. 2. She will hypertension blood pressure well controlled continue present medications. 3. Dyslipidemia: She is taking Lipitor which I will continue.   Elderly lady with multiple nonspecific symptoms.  Will check her heart with echocardiogram and stress test however I have a rather low level suspicion that we will find something significant there.  She may be struggling with depression that need to be controlled.  I reviewed all laboratory test done by primary care team and so far there is nothing that we can identify that can explain her symptoms.  See her back in my office in about 1 month  Medication Adjustments/Labs and Tests Ordered: Current medicines are reviewed at length with the patient today.  Concerns regarding medicines are outlined above.  No orders of the defined types were placed in this encounter.  No orders of the defined types were placed in this encounter.   Signed, Park Liter, MD, [facility]. 09/15/2017 9:59 AM    [place]

## 2017-09-19 ENCOUNTER — Telehealth (HOSPITAL_COMMUNITY): Payer: Self-pay | Admitting: *Deleted

## 2017-09-19 NOTE — Telephone Encounter (Signed)
Left message on voicemail per DPR in reference to upcoming appointment scheduled on 09/22/17 at 1000 with detailed instructions given per Myocardial Perfusion Study Information Sheet for the test. LM to arrive 15 minutes early, and that it is imperative to arrive on time for appointment to keep from having the test rescheduled. If you need to cancel or reschedule your appointment, please call the office within 24 hours of your appointment. Failure to do so may result in a cancellation of your appointment, and a $50 no show fee. Phone number given for call back for any questions. Dyneisha Murchison, Ranae Palms

## 2017-09-21 ENCOUNTER — Telehealth (HOSPITAL_COMMUNITY): Payer: Self-pay | Admitting: Cardiology

## 2017-09-21 NOTE — Telephone Encounter (Signed)
User: Mary Trevino A Date/time: 09/21/17 1:21 PM  Comment: Called pt and lmsg for her to Cb to r/s echo..RG  Context:  Outcome: Left Message  Phone number: [phone] Phone Type: Home Phone  Comm. type: Telephone Call type: Outgoing  Contact: [name] Relation to patient: Self   Patient stated that she would rather call back to r/s these test..

## 2017-09-22 ENCOUNTER — Other Ambulatory Visit (HOSPITAL_COMMUNITY): Payer: Medicare Other

## 2017-09-22 ENCOUNTER — Encounter (HOSPITAL_COMMUNITY): Payer: Medicare Other

## 2017-09-29 ENCOUNTER — Institutional Professional Consult (permissible substitution): Payer: Medicare Other | Admitting: Neurology

## 2017-10-03 ENCOUNTER — Other Ambulatory Visit: Payer: Self-pay | Admitting: Family

## 2017-10-05 ENCOUNTER — Ambulatory Visit: Payer: Medicare Other | Admitting: Family

## 2017-10-07 ENCOUNTER — Encounter (HOSPITAL_COMMUNITY): Payer: Self-pay | Admitting: Radiology

## 2017-10-11 ENCOUNTER — Telehealth (HOSPITAL_COMMUNITY): Payer: Self-pay | Admitting: Cardiology

## 2017-10-11 NOTE — Telephone Encounter (Signed)
09/21/17 Patient cancelled appointment for 09/22/17, stating she will be out of town EVD 10/04/17 [facility] to schedule nuc and echo EVD 10/05/17 Called pt and lmsg for her to CB to r/s echo..RG 10/07/17 Letter sent EVD

## 2017-10-14 ENCOUNTER — Ambulatory Visit (INDEPENDENT_AMBULATORY_CARE_PROVIDER_SITE_OTHER): Payer: Medicare Other | Admitting: Neurology

## 2017-10-14 ENCOUNTER — Encounter: Payer: Self-pay | Admitting: Neurology

## 2017-10-14 VITALS — BP 118/74 | HR 67 | Ht 64.0 in | Wt 143.0 lb

## 2017-10-14 DIAGNOSIS — G44329 Chronic post-traumatic headache, not intractable: Secondary | ICD-10-CM | POA: Diagnosis not present

## 2017-10-14 MED ORDER — GABAPENTIN 400 MG PO CAPS: 400.0000 mg | ORAL_CAPSULE | Freq: Two times a day (BID) | ORAL | 3 refills | Status: DC

## 2017-10-14 NOTE — Patient Instructions (Signed)
1.  Stop the 300mg  gabapentin.  Instead, start gabapentin 400mg  capsule, take 1 capsule twice daily.   2.  Contact me in 6 weeks.  If head pain not improved, we can either increase dose or change to a new medication 3.  Follow up in 4 months.

## 2017-10-14 NOTE — Progress Notes (Signed)
NEUROLOGY FOLLOW UP OFFICE NOTE  Mary Trevino [id number]  HISTORY OF PRESENT ILLNESS: Mary Trevino is an 84 year old right-handed female with glaucoma and corneal transplant with blindness in right eye, hypertension, headache, atypical chest pain and history of subarachnoid hemorrhage in September 2017 who follows up for headache.  UPDATE: Last seen in January.  At the time, increased gabapentin 300mg  from once to twice daily.  Headaches are unchanged. Intensity:  Moderate to severe Duration:  10 minutes Frequency:  Daily, several times a day Current NSAIDS:  no Current analgesics:  Tylenol Current triptans:  no Current anti-emetic:  no Current muscle relaxants:  no Current anti-anxiolytic:  no Current sleep aide:  no Current Antihypertensive medications:  Toprol XL, Lasix, amlodipine Current Antidepressant medications:  no Current Anticonvulsant medications:  gabapentin 300mg  twice daily Current Vitamins/Herbal/Supplements:  MVI Current Antihistamines/Decongestants:  no Other therapy:  no    HISTORY: Onset:  Since September 2017 after she fell out of bed and hit the right side of her head against the wall.  She developed right-sided headache, neck and back pain.  She was admitted to [facility] where CT of head from 12/18/15 revealed small subarachnoid hemorrhage in the right frontal parietal region.  She was admitted for monitoring.  Surgery was not indicated.  Repeat head CT from 01/01/16 demonstrated that it was nearly resolved. Location:  Varies (back of head, top of head, either temple) Quality:  pressure Initial Intensity:  Moderate to severe Aura:  no Prodrome:  no Postdrome:  no Associated symptoms:  No nausea, vomiting, photophobia, phonophobia, osmophobia, autonomic symptoms or visual disturbance.  She has not had any new worse headache of her life, waking up from sleep Initial Duration:  Usually 10 minutes but sometimes longer Initial Frequency:  Almost daily  up to several times a day Initial Frequency of abortive medication: unsure Triggers/exacerbating factors:  unsure Relieving factors:  Tylenol Activity:  Does not aggravate   MRI of brain without contrast from 04/16/16 was personally reviewed and was unremarkable.   She also has chronic neck pain with pain radiating down the left arm.  MRI of cervical spine from 11/20/16 was personally reviewed and demonstrated multilevel cervical degenerative disc disease with left sided foraminal stenosis, most notable at C4-5 and C5-6.  However, she also demonstrates facet hypertrophy at C2-3 causing moderate right foraminal stenosis and broad-based disc osteophyte complex at C3-4 causing moderate foraminal narrowing, worse on the right.  She is followed by neurosurgery/interventional pain management in [facility].  She has received epidural injections that have been ineffective.    Past NSAIDS:  ibuprofen Past analgesics:  tramadol Past abortive triptans:  no Past muscle relaxants:  no Past anti-emetic:  no Past antihypertensive medications:  no Past antidepressant medications:  nortriptyline 20mg  Past anticonvulsant medications:  Lyrica Past vitamins/Herbal/Supplements:  no Past antihistamines/decongestants:  no Other past therapies:  Physical therapy, epidural injections for neck  PAST MEDICAL HISTORY: Past Medical History:  Diagnosis Date  . Glaucoma   . Headache   . Hyperlipidemia   . Hypertension   . Subarachnoid hemorrhage ([place]) 12/2015    MEDICATIONS: Current Outpatient Medications on File Prior to Visit  Medication Sig Dispense Refill  . acetaminophen (TYLENOL) 325 MG tablet Take 650 mg by mouth every 4 (four) hours as needed.    [name] amLODipine (NORVASC) 5 MG tablet Take 1 tablet (5 mg total) by mouth daily. 30 tablet 0  . atorvastatin (LIPITOR) 10 MG tablet TAKE 1 TABLET DAILY  90 tablet 1  . brimonidine-timolol (COMBIGAN) 0.2-0.5 % ophthalmic solution Place 1 drop into both eyes twice a  day    . furosemide (LASIX) 20 MG tablet Take 1 tablet (20 mg total) by mouth daily as needed for edema. 90 tablet 1  . loteprednol (LOTEMAX) 0.5 % ophthalmic suspension Place 1 drop into the right eye.     . meloxicam (MOBIC) 7.5 MG tablet Take 1 tablet (7.5 mg total) by mouth daily. 14 tablet 0  . metoprolol succinate (TOPROL-XL) 25 MG 24 hr tablet Take 1 tablet (25 mg total) by mouth daily. 90 tablet 1  . pantoprazole (PROTONIX) 40 MG tablet TAKE 1 TABLET DAILY 90 tablet 1  . prednisoLONE acetate (PRED FORTE) 1 % ophthalmic suspension Place 1 drop into the left eye twice a day     No current facility-administered medications on file prior to visit.     ALLERGIES: Allergies  Allergen Reactions  . Augmentin [Amoxicillin-Pot Clavulanate] Diarrhea    FAMILY HISTORY: Family History  Problem Relation Age of Onset  . Hypertension Mother        died of cardiac arrest age 58  . Heart attack Mother   . Other Father        natural causes  . Colon cancer Daughter        died age 32    SOCIAL HISTORY: Social History   Socioeconomic History  . Marital status: Widowed    Spouse name: Not on file  . Number of children: 3  . Years of education: HS  . Highest education level: Not on file  Occupational History  . 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 Financial resource strain: Not on file  . Food insecurity:    Worry: Not on file    Inability: Not on file  . Transportation needs:    Medical: Not on file    Non-medical: Not on file  Tobacco Use  . Smoking status: Former Smoker    Years: 10.00  . Smokeless tobacco: Never Used  Substance and Sexual Activity  . Alcohol use: No    Alcohol/week: 0.0 oz  . Drug use: No  . Sexual activity: Not on file  Lifestyle  . Physical activity:    Days per week: Not on file    Minutes per session: Not on file  . Stress: Not on file  Relationships  . Social connections:    Talks on phone: Not on file    Gets together: Not on file     Attends religious service: Not on file    Active member of club or organization: Not on file    Attends meetings of clubs or organizations: Not on file    Relationship status: Not on file  . Intimate partner violence:    Fear of current or ex partner: Not on file    Emotionally abused: Not on file    Physically abused: Not on file    Forced sexual activity: Not on file  Other Topics Concern  . 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   No caffeine use.    REVIEW OF SYSTEMS: Constitutional: No fevers, chills, or sweats, no generalized fatigue, change in appetite Eyes: No visual changes,  double vision, eye pain Ear, nose and throat: No hearing loss, ear pain, nasal congestion, sore throat Cardiovascular: No chest pain, palpitations Respiratory:  No shortness of breath at rest or with exertion, wheezes GastrointestinaI: No nausea, vomiting, diarrhea, abdominal pain, fecal incontinence Genitourinary:  No dysuria, urinary retention or frequency Musculoskeletal:  No neck pain, back pain Integumentary: No rash, pruritus, skin lesions Neurological: as above Psychiatric: No depression, insomnia, anxiety Endocrine: No palpitations, fatigue, diaphoresis, mood swings, change in appetite, change in weight, increased thirst Hematologic/Lymphatic:  No purpura, petechiae. Allergic/Immunologic: no itchy/runny eyes, nasal congestion, recent allergic reactions, rashes  PHYSICAL EXAM: Vitals:   10/14/17 1336  BP: 118/74  Pulse: 67  SpO2: 97%   General: No acute distress.  Patient appears well-groomed.   Head:  Normocephalic/atraumatic Eyes:  Fundi examined but not visualized Neck: supple, no paraspinal tenderness, full range of motion Heart:  Regular rate and rhythm Lungs:   Clear to auscultation bilaterally Back: No paraspinal tenderness Neurological Exam: alert and oriented to person, place, and time. Attention span and concentration intact, recent and remote memory intact, fund of knowledge intact.  Speech fluent and not dysarthric, language intact.  CN II-XII intact. Bulk and tone normal, muscle strength 5/5 throughout.  Sensation to light touch  intact.  Deep tendon reflexes 2+ throughout.  Finger to nose testing intact.  Gait normal, Romberg negative.   IMPRESSION: Chronic post-traumatic headache  PLAN: 1.  Increase gabapentin to 400mg  twice daily.  If not improved in 6 weeks, she is to contact us so we can further increase dose or change to alternative medication (possibly Depakote) 2.  Follow up in 4 months.   Metta Clines, DO  CC: Debbrah Alar, NP

## 2017-10-25 ENCOUNTER — Ambulatory Visit: Payer: Medicare Other | Admitting: Cardiology

## 2017-10-27 ENCOUNTER — Encounter: Payer: Self-pay | Admitting: Cardiology

## 2017-12-05 ENCOUNTER — Ambulatory Visit (INDEPENDENT_AMBULATORY_CARE_PROVIDER_SITE_OTHER): Payer: Medicare Other | Admitting: Neurology

## 2017-12-05 ENCOUNTER — Encounter: Payer: Self-pay | Admitting: Neurology

## 2017-12-05 VITALS — BP 134/78 | HR 68 | Ht 64.0 in | Wt 145.5 lb

## 2017-12-05 DIAGNOSIS — G8929 Other chronic pain: Secondary | ICD-10-CM | POA: Insufficient documentation

## 2017-12-05 DIAGNOSIS — R51 Headache: Secondary | ICD-10-CM | POA: Diagnosis not present

## 2017-12-05 DIAGNOSIS — R519 Headache, unspecified: Secondary | ICD-10-CM | POA: Insufficient documentation

## 2017-12-05 MED ORDER — NORTRIPTYLINE HCL 10 MG PO CAPS: 20.0000 mg | ORAL_CAPSULE | Freq: Every day | ORAL | 11 refills | Status: AC

## 2017-12-05 NOTE — Patient Instructions (Signed)
Start nortriptyline as headache prevention.  '10mg'$  every night for one week,  Then '20mg'$  every night.  Tylenol, or Aleve as needed for headache,   ESR, CRP today, will call result.

## 2017-12-05 NOTE — Progress Notes (Signed)
PATIENT: Mary Trevino DOB: 1926-07-12  Chief Complaint  Patient presents with  . Headache    Reports intermittent, sharp pains throughout her head.  The pain does not follow any particular pattern and only last for several minutes at at time.  Her PCP provided her with gabapentin 492m, one capsule BID but she stopped it due to lack of relief.  At times, she has dizziness and nausea.    .Marland KitchenPCP    ODebbrah Alar NP     HISTORICAL  Mary Trevino 84 years old right-handed female, seen in refer by her primary care nurse practitioner MDebbrah Alarfor follow-up of intracranial bleeding in September 2017, initial evaluation was April 01 2016.  She was brought in by her aid, but alone during interview. Lives with her daughter's family, she had past medical history of hyperlipidemia, hypertension, glaucoma, legally blind in her right eye, history of bilateral corneal transplant, right side was in 2014, left side was in 2016. She can only see through her left eye, she use of magnified glasses to read through her left eye.  At baseline, she watches TV, in past few days, she complains of few weeks history right side low back pain, radiating pain to right leg, calf, right foot numbness, pain limited her walking, she did not have radiating pain to left leg, she has no bowel and bladder incontinence.   She fell out of the bed while dreaming, on December 20 2015, hit her right head against the wall, she developed severe headaches at right side, across the back, difficulty turning her neck, [facility] head report on January 01 2016, and December 20 2015, traumatic subarachnoid hemorrhage along the right cerebral convexity, no acute infarction, generalized cerebral volume loss, atherosclerotic calcification,  Now she still has right parietal area headaches, at night, it go up to 10/10, pressure, throbbing, no light noise sensitivity, no  nause, difficulty sleeping.  She has nocturia, 4 times each night.   UPDATE Apr 22 2016: She still has significant headaches at right occipital, parietal area, Especially when she lying down at nighttime trying to go to sleep, she is now taking gabapentin 300 mg at bedtime, nortriptyline 20 mg at nighttime with limited help,  We have personally reviewe MRI of lumbar, there was significant multilevel degenerative disc disease most prominent at L L4-5, L5-S1, with moderate to severe bilateral foraminal stenosis.    MRI of the brain in January 2018, generalized atrophy, evidence of chronic right subarachnoid hemorrhage, no acute abnormality   Today she was noted to have significant right occipital area pain upon deep palpation, I have performed trigger point injection   UPDATE December 05 2017:  She lives with her daughter, brought in by aid, alone at visit, " I never feel good, sick like".  She complain of dizziness, nasal drainage, she has headache every few days, presented to ER on Sep 09 2017 for headache on top of her head.   I personally reviewed CT head without contrast on Sep 09, 2017, chronic changes, generalized atrophy, there was no acute abnormality.  CT of cervical spine, multilevel degenerative changes, there was no significant canal stenosis,  Laboratory evaluations in June 2019, normal TSH, liver functional test, TSH, CBC, BMP showed mild elevated glucose 106, lipid profile LDL of 79,  She was seen by Dr. JMerry Proud given gabapentin 4087mbid, she is no longer taking it, no significant benefit noted.  She usually  does not take anything for her headaches, complains of difficulty sleeping,  REVIEW OF SYSTEMS: Full 14 system review of systems performed and notable only for not enough sleep, joint pain, achy muscles, headaches  ALLERGIES: Allergies  Allergen Reactions  . Augmentin [Amoxicillin-Pot Clavulanate] Diarrhea    HOME MEDICATIONS: Current Outpatient Medications    Medication Sig Dispense Refill  . acetaminophen (TYLENOL) 325 MG tablet Take 650 mg by mouth every 4 (four) hours as needed.    [name] amLODipine (NORVASC) 5 MG tablet Take 1 tablet (5 mg total) by mouth daily. 30 tablet 0  . atorvastatin (LIPITOR) 10 MG tablet TAKE 1 TABLET DAILY 90 tablet 1  . brimonidine-timolol (COMBIGAN) 0.2-0.5 % ophthalmic solution Place 1 drop into both eyes twice a day    . furosemide (LASIX) 20 MG tablet Take 1 tablet (20 mg total) by mouth daily as needed for edema. 90 tablet 1  . loteprednol (LOTEMAX) 0.5 % ophthalmic suspension Place 1 drop into the right eye.     . metoprolol succinate (TOPROL-XL) 25 MG 24 hr tablet Take 1 tablet (25 mg total) by mouth daily. 90 tablet 1  . pantoprazole (PROTONIX) 40 MG tablet TAKE 1 TABLET DAILY 90 tablet 1  . prednisoLONE acetate (PRED FORTE) 1 % ophthalmic suspension Place 1 drop into the left eye twice a day     No current facility-administered medications for this visit.     PAST MEDICAL HISTORY: Past Medical History:  Diagnosis Date  . Glaucoma   . Headache   . Hyperlipidemia   . Hypertension   . Subarachnoid hemorrhage ([place]) 12/2015    PAST SURGICAL HISTORY: Past Surgical History:  Procedure Laterality Date  . ABDOMINAL HYSTERECTOMY  1975  . CORNEAL TRANSPLANT     2016 (left) 2014 (right)     FAMILY HISTORY: Family History  Problem Relation Age of Onset  . Hypertension Mother        died of cardiac arrest age 11  . Heart attack Mother   . Other Father        natural causes  . Colon cancer Daughter        died age 21    SOCIAL HISTORY:  Social History   Socioeconomic History  . Marital status: Widowed    Spouse name: Not on file  . Number of children: 3  . Years of education: HS  . Highest education level: Not on file  Occupational History  . 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 Financial resource strain: Not on file  . Food insecurity:    Worry: Not on file    Inability: Not on file  .  Transportation needs:    Medical: Not on file    Non-medical: Not on file  Tobacco Use  . Smoking status: Former Smoker    Years: 10.00  . Smokeless tobacco: Never Used  Substance and Sexual Activity  . Alcohol use: No    Alcohol/week: 0.0 standard drinks  . Drug use: No  . Sexual activity: Not on file  Lifestyle  . Physical activity:    Days per week: Not on file    Minutes per session: Not on file  . Stress: Not on file  Relationships  . Social connections:    Talks on phone: Not on file    Gets together: Not on file    Attends religious service: Not on file    Active member of club or organization: Not on file    Attends meetings  of clubs or organizations: Not on file    Relationship status: Not on file  . Intimate partner violence:    Fear of current or ex partner: Not on file    Emotionally abused: Not on file    Physically abused: Not on file    Forced sexual activity: Not on file  Other Topics Concern  . 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   No caffeine use.     PHYSICAL EXAM   Vitals:   12/05/17 1507  BP: 134/78  Pulse: 68  Weight: 145 lb 8 oz (66 kg)  Height: _0  (1.626 m)    Not recorded      Body mass index is 24.98 kg/m.  PHYSICAL EXAMNIATION:  Gen: NAD, conversant, well nourised, obese, well groomed                     Cardiovascular: Regular rate rhythm, no peripheral edema, warm, nontender. Eyes: Conjunctivae clear without exudates or hemorrhage Neck: Supple, no carotid bruits. Pulmonary: Clear to auscultation bilaterally   NEUROLOGICAL EXAM:  MENTAL STATUS: Speech:    Speech is normal; fluent and spontaneous with normal comprehension.  Cognition:     Orientation to time, place and person     Normal recent and  remote memory     Normal Attention span and concentration     Normal Language, naming, repeating,spontaneous speech     Fund of knowledge   CRANIAL NERVES: CN II: Deformity of bilateral cornea, due to previous surgery and transplant, left eye counting fingers, no light sensitivity to right eye CN III, IV, VI: extraocular movement are normal. No ptosis. CN V: Facial sensation is intact to pinprick in all 3 divisions bilaterally. Corneal responses are intact.  CN VII: Face is symmetric with normal eye closure and smile. CN VIII: Hearing is normal to rubbing fingers CN IX, X: Palate elevates symmetrically. Phonation is normal. CN XI: Head turning and shoulder shrug are intact CN XII: Tongue is midline with normal movements and no atrophy.  MOTOR: There is no pronator drift of out-stretched arms. Muscle bulk and tone are normal. Muscle strength is normal.  REFLEXES: Reflexes are 2+ and symmetric at the biceps, triceps, knees, and ankles. Plantar responses are flexor.  SENSORY: Intact to light touch, pinprick, positional sensation and vibratory sensation are intact in fingers and toes.  COORDINATION: Rapid alternating movements and fine finger movements are intact. There is no dysmetria on finger-to-nose and heel-knee-shin.    GAIT/STANCE: She needs pushed up to get up from seated position, cautious, mildly unsteady   DIAGNOSTIC DATA (LABS, IMAGING, TESTING) - I reviewed patient records, labs, notes, testing and imaging myself where available.   ASSESSMENT AND PLAN  Mary Trevino is a 84 y.o. female   History of subarachnoid hemorrhage status post fall on December 20 2015  Frequent headaches,  ESR C-reactive protein to rule out temporal arteritis  Nortriptyline 10 mg titrating to 20 mg every night as preventive medications  Tylenol or Aleve as needed for headaches  Continue follow-up with her primary care physician   Marcial Pacas, M.D. Ph.D.  [facility] [address] [place], [place] [address] Ph: [phone] Fax: [phone]  CC: Debbrah Alar, NP

## 2017-12-06 LAB — C-REACTIVE PROTEIN: CRP: 2 mg/L (ref 0–10)

## 2017-12-06 LAB — SEDIMENTATION RATE: Sed Rate: 33 mm/hr (ref 0–40)

## 2017-12-09 ENCOUNTER — Encounter: Payer: Self-pay | Admitting: Family

## 2017-12-09 ENCOUNTER — Ambulatory Visit (INDEPENDENT_AMBULATORY_CARE_PROVIDER_SITE_OTHER): Payer: Medicare Other | Admitting: Family

## 2017-12-09 VITALS — BP 124/78 | HR 67 | Temp 98.7°F | Resp 16 | Ht 64.0 in | Wt 145.0 lb

## 2017-12-09 DIAGNOSIS — F32A Depression, unspecified: Secondary | ICD-10-CM

## 2017-12-09 DIAGNOSIS — H6123 Impacted cerumen, bilateral: Secondary | ICD-10-CM | POA: Diagnosis not present

## 2017-12-09 DIAGNOSIS — R202 Paresthesia of skin: Secondary | ICD-10-CM

## 2017-12-09 DIAGNOSIS — F329 Major depressive disorder, single episode, unspecified: Secondary | ICD-10-CM

## 2017-12-09 DIAGNOSIS — I10 Essential (primary) hypertension: Secondary | ICD-10-CM | POA: Diagnosis not present

## 2017-12-09 LAB — BASIC METABOLIC PANEL
BUN: 17 mg/dL (ref 6–23)
CALCIUM: 9.2 mg/dL (ref 8.4–10.5)
CO2: 29 meq/L (ref 19–32)
Chloride: 108 mEq/L (ref 96–112)
Creatinine, Ser: 0.8 mg/dL (ref 0.40–1.20)
GFR: 86.89 mL/min (ref >60.00)
GLUCOSE: 82 mg/dL (ref 70–99)
Potassium: 4.4 mEq/L (ref 3.5–5.1)
SODIUM: 142 meq/L (ref 135–145)

## 2017-12-09 LAB — VITAMIN B12: Vitamin B-12: 935 pg/mL — ABNORMAL HIGH (ref 211–911)

## 2017-12-09 MED ORDER — ESCITALOPRAM OXALATE 5 MG PO TABS: 5.0000 mg | ORAL_TABLET | Freq: Every day | ORAL | 0 refills | Status: DC

## 2017-12-09 NOTE — Progress Notes (Signed)
Subjective:    Patient ID: Mary Trevino, female    DOB: December 09, 1926, 84 y.o.   MRN: [id number]  HPI  Mary Trevino is an 84 yr old female who presents today for follow up.  HTN- maintained on toprol xl, amlodipine. Reports some chronic swelling in the right lower extremity.  Notes a burning pain in her lower legs at night.   BP Readings from Last 3 Encounters:  12/09/17 124/78  12/05/17 134/78  10/14/17 118/74   Hearing problem- reports decreased hearing in the right ear.   Hyperlipidemia- maitntained on atorvastatin.  Lab Results  Component Value Date   CHOL 173 07/15/2017   HDL 82.50 07/15/2017   LDLCALC 79 07/15/2017   TRIG 55.0 07/15/2017   CHOLHDL 2 07/15/2017   Notes generally "not feeling well."     Review of Systems    see HPI  Past Medical History:  Diagnosis Date  . Glaucoma   . Headache   . Hyperlipidemia   . Hypertension   . Subarachnoid hemorrhage ([place]) 12/2015     Social History   Socioeconomic History  . Marital status: Widowed    Spouse name: Not on file  . Number of children: 3  . Years of education: HS  . Highest education level: Not on file  Occupational History  . 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 Financial resource strain: Not on file  . Food insecurity:    Worry: Not on file    Inability: Not on file  . Transportation needs:    Medical: Not on file    Non-medical: Not on file  Tobacco Use  . Smoking status: Former Smoker    Years: 10.00  . Smokeless tobacco: Never Used  Substance and Sexual Activity  . Alcohol use: No    Alcohol/week: 0.0 standard drinks  . Drug use: No  . Sexual activity: Not on file  Lifestyle  . Physical activity:    Days per week: Not on file    Minutes per session: Not on file  . Stress: Not on file  Relationships  . Social connections:    Talks on phone: Not on file    Gets together: Not on file    Attends religious service: Not on file    Active member of club or organization: Not on file   Attends meetings of clubs or organizations: Not on file    Relationship status: Not on file  . Intimate partner violence:    Fear of current or ex partner: Not on file    Emotionally abused: Not on file    Physically abused: Not on file    Forced sexual activity: Not on file  Other Topics Concern  . 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   No caffeine use.    Past Surgical History:  Procedure Laterality Date  . ABDOMINAL HYSTERECTOMY  1975  . CORNEAL TRANSPLANT     2016 (left) 2014 (right)     Family History  Problem Relation Age of Onset  . Hypertension Mother        died of cardiac arrest age 61  . Heart attack Mother   . Other Father        natural causes  .  Colon cancer Daughter        died age 24    Allergies  Allergen Reactions  . Augmentin [Amoxicillin-Pot Clavulanate] Diarrhea    Current Outpatient Medications on File Prior to Visit  Medication Sig Dispense Refill  . acetaminophen (TYLENOL) 325 MG tablet Take 650 mg by mouth every 4 (four) hours as needed.    [name] amLODipine (NORVASC) 5 MG tablet Take 1 tablet (5 mg total) by mouth daily. 30 tablet 0  . atorvastatin (LIPITOR) 10 MG tablet TAKE 1 TABLET DAILY 90 tablet 1  . brimonidine-timolol (COMBIGAN) 0.2-0.5 % ophthalmic solution Place 1 drop into both eyes twice a day    . furosemide (LASIX) 20 MG tablet Take 1 tablet (20 mg total) by mouth daily as needed for edema. 90 tablet 1  . loteprednol (LOTEMAX) 0.5 % ophthalmic suspension Place 1 drop into the right eye.     . metoprolol succinate (TOPROL-XL) 25 MG 24 hr tablet Take 1 tablet (25 mg total) by mouth daily. 90 tablet 1  . nortriptyline (PAMELOR) 10 MG capsule Take 2 capsules (20 mg total) by mouth at bedtime. 60 capsule 11  .  pantoprazole (PROTONIX) 40 MG tablet TAKE 1 TABLET DAILY 90 tablet 1  . prednisoLONE acetate (PRED FORTE) 1 % ophthalmic suspension Place 1 drop into the left eye twice a day     No current facility-administered medications on file prior to visit.     BP 124/78 (BP Location: Right Arm, Cuff Size: Normal)   Pulse 67   Temp 98.7 F (37.1 C) (Oral)   Resp 16   Ht 5\' 4"  (1.626 m)   Wt 145 lb (65.8 kg)   SpO2 100%   BMI 24.89 kg/m    Objective:   Physical Exam  Constitutional: She appears well-developed and well-nourished.  HENT:  Bilateral cerumen impaction  Cardiovascular: Normal rate, regular rhythm and normal heart sounds.  No murmur heard. Pulmonary/Chest: Effort normal and breath sounds normal. No respiratory distress. She has no wheezes.  Psychiatric: She has a normal Trevino and affect. Her behavior is normal. Judgment and thought content normal.          Assessment & Plan:  Cerumen impaction- Ceruminosis is noted.  Wax is removed by syringing and manual debridement with curette by this provider. Instructions for home care to prevent wax buildup are given. Pt reported restoration of hearing after removal of cerumen.   Depression- trial of lexapro 5mg  once daily. I would like to see if this helps her general malaise which she has had for some time.    HTN- bp stable, continue current meds. Obtain bmet.   Paresthesias- check b12 level.

## 2017-12-09 NOTE — Patient Instructions (Addendum)
Please complete lab work prior to leaving. Let me know if your hearing does not improve after the wax removal.

## 2017-12-15 DIAGNOSIS — Z947 Corneal transplant status: Secondary | ICD-10-CM | POA: Diagnosis not present

## 2017-12-15 DIAGNOSIS — H4089 Other specified glaucoma: Secondary | ICD-10-CM | POA: Diagnosis not present

## 2017-12-16 DIAGNOSIS — H21512 Anterior synechiae (iris), left eye: Secondary | ICD-10-CM | POA: Diagnosis not present

## 2017-12-16 DIAGNOSIS — Z947 Corneal transplant status: Secondary | ICD-10-CM | POA: Diagnosis not present

## 2017-12-16 DIAGNOSIS — H21562 Pupillary abnormality, left eye: Secondary | ICD-10-CM | POA: Diagnosis not present

## 2017-12-20 ENCOUNTER — Ambulatory Visit: Payer: Medicare Other | Admitting: Family

## 2017-12-27 ENCOUNTER — Ambulatory Visit (INDEPENDENT_AMBULATORY_CARE_PROVIDER_SITE_OTHER): Payer: Medicare Other

## 2017-12-27 DIAGNOSIS — Z111 Encounter for screening for respiratory tuberculosis: Secondary | ICD-10-CM

## 2017-12-27 NOTE — Progress Notes (Signed)
Pre visit review using our clinic tool,if applicable. No additional management support is needed unless otherwise documented below in the visit note.   Patient in today for PPD placement. Given 0.1 ml intradermally in the left upper inner arm.  Patient to return on 12/29/17 for reading. Form left for completion.

## 2017-12-29 ENCOUNTER — Ambulatory Visit: Payer: Medicare Other

## 2017-12-29 DIAGNOSIS — Q132 Other congenital malformations of iris: Secondary | ICD-10-CM | POA: Diagnosis not present

## 2017-12-29 DIAGNOSIS — I10 Essential (primary) hypertension: Secondary | ICD-10-CM | POA: Diagnosis not present

## 2017-12-29 DIAGNOSIS — Z720 Tobacco use: Secondary | ICD-10-CM | POA: Diagnosis not present

## 2017-12-29 DIAGNOSIS — Z111 Encounter for screening for respiratory tuberculosis: Secondary | ICD-10-CM

## 2017-12-29 DIAGNOSIS — H21562 Pupillary abnormality, left eye: Secondary | ICD-10-CM | POA: Diagnosis not present

## 2017-12-29 LAB — TB SKIN TEST
Induration: 0 mm
TB Skin Test: NEGATIVE

## 2017-12-29 NOTE — Progress Notes (Signed)
PPD negative. Paperwork given to daughter.

## 2018-01-06 ENCOUNTER — Ambulatory Visit (INDEPENDENT_AMBULATORY_CARE_PROVIDER_SITE_OTHER): Payer: Medicare Other | Admitting: Family

## 2018-01-06 ENCOUNTER — Encounter: Payer: Self-pay | Admitting: Family

## 2018-01-06 VITALS — BP 137/71 | HR 70 | Temp 98.2°F | Resp 16 | Ht 64.0 in | Wt 145.0 lb

## 2018-01-06 DIAGNOSIS — Z23 Encounter for immunization: Secondary | ICD-10-CM | POA: Diagnosis not present

## 2018-01-06 DIAGNOSIS — F329 Major depressive disorder, single episode, unspecified: Secondary | ICD-10-CM

## 2018-01-06 DIAGNOSIS — F32A Depression, unspecified: Secondary | ICD-10-CM

## 2018-01-06 MED ORDER — ESCITALOPRAM OXALATE 10 MG PO TABS: 10.0000 mg | ORAL_TABLET | Freq: Every day | ORAL | 0 refills | Status: DC

## 2018-01-06 NOTE — Progress Notes (Signed)
Subjective:    Patient ID: Mary Trevino, female    DOB: 07/06/1926, 84 y.o.   MRN: [id number]  HPI  [name] is an 84 yr old female who presents today for follow up of her depression.  Last visit we gave her a trial of lexapro 5 mg once daily due to her overall low mood/malaise.  She reports no improvement in her mood.  Denies tearfulness. Denies thoughts of hurting herself or others.    Reports that she is disappointed that her recent eye surgery did not improve her vision. She states that she follows up with her eye surgeon in 1 month and they are considering an eyelid lift to help her vision.      Review of Systems    see  HPI  Past Medical History:  Diagnosis Date  . Glaucoma   . Headache   . Hyperlipidemia   . Hypertension   . Subarachnoid hemorrhage ([place]) 12/2015     Social History   Socioeconomic History  . Marital status: Widowed    Spouse name: Not on file  . Number of children: 3  . Years of education: HS  . Highest education level: Not on file  Occupational History  . 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 Financial resource strain: Not on file  . Food insecurity:    Worry: Not on file    Inability: Not on file  . Transportation needs:    Medical: Not on file    Non-medical: Not on file  Tobacco Use  . Smoking status: Former Smoker    Years: 10.00  . Smokeless tobacco: Never Used  Substance and Sexual Activity  . Alcohol use: No    Alcohol/week: 0.0 standard drinks  . Drug use: No  . Sexual activity: Not on file  Lifestyle  . Physical activity:    Days per week: Not on file    Minutes per session: Not on file  . Stress: Not on file  Relationships  . Social connections:    Talks on phone: Not on file    Gets together: Not on file    Attends religious service: Not on file    Active member of club or organization: Not on file    Attends meetings of clubs or organizations: Not on file    Relationship status: Not on file  . Intimate partner  violence:    Fear of current or ex partner: Not on file    Emotionally abused: Not on file    Physically abused: Not on file    Forced sexual activity: Not on file  Other Topics Concern  . 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   No caffeine use.    Past Surgical History:  Procedure Laterality Date  . ABDOMINAL HYSTERECTOMY  1975  . CORNEAL TRANSPLANT     2016 (left) 2014 (right)     Family History  Problem Relation Age of Onset  . Hypertension Mother        died of cardiac arrest age 108  . Heart attack Mother   . Other Father        natural causes  . Colon cancer Daughter        died  age 23    Allergies  Allergen Reactions  . Augmentin [Amoxicillin-Pot Clavulanate] Diarrhea    Current Outpatient Medications on File Prior to Visit  Medication Sig Dispense Refill  . acetaminophen (TYLENOL) 325 MG tablet Take 650 mg by mouth every 4 (four) hours as needed.    [name] amLODipine (NORVASC) 5 MG tablet Take 1 tablet (5 mg total) by mouth daily. 30 tablet 0  . atorvastatin (LIPITOR) 10 MG tablet TAKE 1 TABLET DAILY 90 tablet 1  . brimonidine-timolol (COMBIGAN) 0.2-0.5 % ophthalmic solution Place 1 drop into both eyes twice a day    . escitalopram (LEXAPRO) 5 MG tablet Take 1 tablet (5 mg total) by mouth daily. 90 tablet 0  . furosemide (LASIX) 20 MG tablet Take 1 tablet (20 mg total) by mouth daily as needed for edema. 90 tablet 1  . ketorolac (ACULAR) 0.4 % SOLN Administer 1 drop into the left eye Four (4) times a day. for 21 days    . loteprednol (LOTEMAX) 0.5 % ophthalmic suspension Place 1 drop into the right eye.     . metoprolol succinate (TOPROL-XL) 25 MG 24 hr tablet Take 1 tablet (25 mg total) by mouth daily. 90 tablet 1  . nortriptyline (PAMELOR) 10 MG  capsule Take 2 capsules (20 mg total) by mouth at bedtime. 60 capsule 11  . pantoprazole (PROTONIX) 40 MG tablet TAKE 1 TABLET DAILY 90 tablet 1  . prednisoLONE acetate (PRED FORTE) 1 % ophthalmic suspension Place 1 drop into the left eye twice a day    . trimethoprim-polymyxin b (POLYTRIM) ophthalmic solution INT 1 GTT IN OS QID FOR 7 DAYS  0   No current facility-administered medications on file prior to visit.     BP 137/71 (BP Location: Right Arm, Patient Position: Sitting, Cuff Size: Small)   Pulse 70   Temp 98.2 F (36.8 C) (Oral)   Resp 16   Ht 5\' 4"  (1.626 m)   Wt 145 lb (65.8 kg)   SpO2 100%   BMI 24.89 kg/m    Objective:   Physical Exam  Constitutional: She is oriented to person, place, and time. She appears well-developed and well-nourished.  Cardiovascular: Normal rate, regular rhythm and normal heart sounds.  No murmur heard. Pulmonary/Chest: Effort normal and breath sounds normal. No respiratory distress. She has no wheezes.  Neurological: She is alert and oriented to person, place, and time.  Skin: Skin is warm and dry.  Psychiatric: She has a normal mood and affect. Her behavior is normal. Judgment and thought content normal.          Assessment & Plan:  Depression- uncontrolled. Her frustration with her vision issues is a contributing factor I believe. Will increase lexapro from 5mg  to 10mg  once daily. Plan follow up in 2 months. She is advised to let me know if her depression symptoms worsen in the meantime and to call 911 if she develops thoughts of hurting herself or others.   Flu shot today.

## 2018-01-06 NOTE — Patient Instructions (Signed)
Please increase lexpro from 5mg  to 10mg .

## 2018-01-10 DIAGNOSIS — H02403 Unspecified ptosis of bilateral eyelids: Secondary | ICD-10-CM | POA: Diagnosis not present

## 2018-01-11 ENCOUNTER — Institutional Professional Consult (permissible substitution): Payer: Medicare Other | Admitting: Neurology

## 2018-01-16 ENCOUNTER — Ambulatory Visit: Payer: Medicare Other | Admitting: Family

## 2018-01-26 ENCOUNTER — Telehealth: Payer: Self-pay | Admitting: Family

## 2018-01-26 NOTE — Telephone Encounter (Signed)
Nanine Means called to inquire about having the faxed FL-2 form faxed to [phone]   Copied from [place]. Topic: General - Other >> Jan 25, 2018 11:35 AM Yvette Rack wrote: Reason for CRM: Benjamine Mola with [employer] states they need the [facility] 2 form faxed to [phone]

## 2018-01-27 NOTE — Telephone Encounter (Signed)
Have you seen this paperwork? Thanks.

## 2018-01-27 NOTE — Telephone Encounter (Signed)
Please see form in Media tab- looks like Percell Miller signed it in September.

## 2018-02-01 NOTE — Telephone Encounter (Signed)
Chief Strategy Officer phoned [employer] and spoke with [place]. Benjamine Mola confirmed that all they needed was a copy of the [facility] form faxed to number given. Author confirmed fax number and faxed it.

## 2018-02-01 NOTE — Telephone Encounter (Signed)
This was apparently taken care of between Percell Miller and Santiago Glad; I did not have paperwork, could you please f/u with provider & caller. Thanks/SLS 10/23

## 2018-02-02 ENCOUNTER — Telehealth: Payer: Self-pay

## 2018-02-02 NOTE — Telephone Encounter (Signed)
Author faxed medication list to [phone]. Author left VM for Seth Bake stating that list would be faxed, and if there is anything else they need, to let us know.   Copied from [place] [id number]. Topic: General - Other >> Feb 01, 2018  1:42 PM Berneta Levins wrote: Reason for CRM:   Seth Bake with [facility] calling.  States they received a FL2 for pt but no attached medication list.  Please refax to Attn:  Seth Bake [phone] Seth Bake can be reached at [phone]

## 2018-02-03 ENCOUNTER — Telehealth: Payer: Self-pay | Admitting: Family

## 2018-02-03 NOTE — Telephone Encounter (Signed)
Noted  

## 2018-02-03 NOTE — Telephone Encounter (Signed)
She needs to be seen please.  If she is not eating/drinking because of it then she should be seen in the ER.

## 2018-02-03 NOTE — Telephone Encounter (Signed)
Copied from [place] [id number]. Topic: Quick Communication - See Telephone Encounter >> Feb 03, 2018  3:15 PM Vernona Rieger wrote: CRM for notification. See Telephone encounter for: 02/03/18.  [facility] called and said she is complaining over a sore throat to the point she can not swallow, very painful, swollen lymph node on left side neck. The nurse is asking if something could becalled in for her or does she need to be seen? [phone] ask for Mary Trevino.

## 2018-02-03 NOTE — Telephone Encounter (Signed)
Notified Elizabeth at [facility]. She states she just spoke with pt and pt is agreeable to go to urgent care. Gave information for urgent care office at the [facility] in [place].

## 2018-02-10 ENCOUNTER — Ambulatory Visit: Payer: Self-pay | Admitting: *Deleted

## 2018-02-10 ENCOUNTER — Ambulatory Visit: Payer: Medicare Other | Admitting: Family Medicine

## 2018-02-10 NOTE — Telephone Encounter (Signed)
Patient's daughter is calling to report her mother has sore throat. She is complaining of pain with swallowing and when daughter checked throat there are white patches in the back.  Daughter works at night and she states her mother states she passed out in the bathroom today- it was not witnessed and the mother is in the bed now. Daughter seems very overwhelmed she states her husband is sick and her mother has just come to live with her. All information came from daughter. PCP had no open appointments.  Reason for Disposition . SEVERE (e.g., excruciating) throat pain  Answer Assessment - Initial Assessment Questions 1. ONSET: "When did the throat start hurting?" (Hours or days ago)      Few days 2. SEVERITY: "How bad is the sore throat?" (Scale 1-10; mild, moderate or severe)   - MILD (1-3):  doesn't interfere with eating or normal activities   - MODERATE (4-7): interferes with eating some solids and normal activities   - SEVERE (8-10):  excruciating pain, interferes with most normal activities   - SEVERE DYSPHAGIA: can't swallow liquids, drooling     Trouble swollowing- throat looks irritated on the left side 3. STREP EXPOSURE: "Has there been any exposure to strep within the past week?" If so, ask: "What type of contact occurred?"      Patient was in assisted living- just can to live with daughter 4.  VIRAL SYMPTOMS: "Are there any symptoms of a cold, such as a runny nose, cough, hoarse voice or red eyes?"      Might have had dizziness states she paas out in bathroom this morning- not witnessed 5. FEVER: "Do you have a fever?" If so, ask: "What is your temperature, how was it measured, and when did it start?"     unknown 6. PUS ON THE TONSILS: "Is there pus on the tonsils in the back of your throat?"     Irritation in throat 7. OTHER SYMPTOMS: "Do you have any other symptoms?" (e.g., difficulty breathing, headache, rash)     no 8. PREGNANCY: "Is there any chance you are pregnant?" "When  was your last menstrual period?"     n/a  Protocols used: SORE THROAT-A-AH

## 2018-02-11 ENCOUNTER — Other Ambulatory Visit: Payer: Self-pay | Admitting: Family

## 2018-02-13 ENCOUNTER — Ambulatory Visit (INDEPENDENT_AMBULATORY_CARE_PROVIDER_SITE_OTHER): Payer: Medicare Other | Admitting: Family

## 2018-02-13 ENCOUNTER — Encounter: Payer: Self-pay | Admitting: Family

## 2018-02-13 VITALS — BP 133/84 | HR 71 | Temp 99.1°F | Resp 18 | Ht 64.0 in | Wt 138.4 lb

## 2018-02-13 DIAGNOSIS — R634 Abnormal weight loss: Secondary | ICD-10-CM

## 2018-02-13 DIAGNOSIS — K1120 Sialoadenitis, unspecified: Secondary | ICD-10-CM

## 2018-02-13 DIAGNOSIS — J029 Acute pharyngitis, unspecified: Secondary | ICD-10-CM

## 2018-02-13 LAB — POCT RAPID STREP A (OFFICE): Rapid Strep A Screen: NEGATIVE

## 2018-02-13 MED ORDER — CLINDAMYCIN HCL 300 MG PO CAPS: 300.0000 mg | ORAL_CAPSULE | Freq: Four times a day (QID) | ORAL | 0 refills | Status: DC

## 2018-02-13 MED ORDER — AMLODIPINE BESYLATE 5 MG PO TABS: 5.0000 mg | ORAL_TABLET | Freq: Every day | ORAL | 1 refills | Status: DC

## 2018-02-13 MED FILL — CLINDAMYCIN HCL 300 MG CAP: 300 | 10 days supply | Qty: 40 | Fill #0

## 2018-02-13 NOTE — Patient Instructions (Signed)
Please begin clindamycin 4 times daily for your neck/throat swelling. Suck on sour candy such as lemon candy for the next few days. You may use tylenol as needed for pain. Call if increased pain, swelling, if fever >101, if you are unable to eat/drink, or if you are not improved in 3-4 days.

## 2018-02-13 NOTE — Progress Notes (Signed)
Subjective:    Patient ID: Mary Trevino, female    DOB: 1927/01/31, 84 y.o.   MRN: [id number]  HPI  Patient is an 84 yr old female who presents today with chief complaint of left sided neck/jaw swelling. Reports some difficulty swallowing and general malaise.She is eating soft foods and able to drink liquids.  Started "a few days ago."    Review of Systems See HPI  Past Medical History:  Diagnosis Date  . Glaucoma   . Headache   . Hyperlipidemia   . Hypertension   . Subarachnoid hemorrhage ([place]) 12/2015     Social History   Socioeconomic History  . Marital status: Widowed    Spouse name: Not on file  . Number of children: 3  . Years of education: HS  . Highest education level: Not on file  Occupational History  . 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 Financial resource strain: Not on file  . Food insecurity:    Worry: Not on file    Inability: Not on file  . Transportation needs:    Medical: Not on file    Non-medical: Not on file  Tobacco Use  . Smoking status: Former Smoker    Years: 10.00  . Smokeless tobacco: Never Used  Substance and Sexual Activity  . Alcohol use: No    Alcohol/week: 0.0 standard drinks  . Drug use: No  . Sexual activity: Not on file  Lifestyle  . Physical activity:    Days per week: Not on file    Minutes per session: Not on file  . Stress: Not on file  Relationships  . Social connections:    Talks on phone: Not on file    Gets together: Not on file    Attends religious service: Not on file    Active member of club or organization: Not on file    Attends meetings of clubs or organizations: Not on file    Relationship status: Not on file  . Intimate partner violence:    Fear of current or ex partner: Not on file    Emotionally abused: Not on file    Physically abused: Not on file    Forced sexual activity: Not on file  Other Topics Concern  . 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   No caffeine use.    Past Surgical History:  Procedure Laterality Date  . ABDOMINAL HYSTERECTOMY  1975  . CORNEAL TRANSPLANT     2016 (left) 2014 (right)     Family History  Problem Relation Age of Onset  . Hypertension Mother        died of cardiac arrest age 20  . Heart attack Mother   . Other Father        natural causes  . Colon cancer Daughter        died age 35    Allergies  Allergen Reactions  . Augmentin [Amoxicillin-Pot Clavulanate] Diarrhea    Current Outpatient Medications on File Prior to Visit  Medication Sig Dispense Refill  . acetaminophen (TYLENOL) 325 MG tablet Take 650 mg by mouth every 4 (four) hours as needed.    [name] amLODipine (NORVASC) 5 MG tablet Take 1 tablet (5 mg  total) by mouth daily. 30 tablet 0  . atorvastatin (LIPITOR) 10 MG tablet TAKE 1 TABLET DAILY 90 tablet 1  . brimonidine-timolol (COMBIGAN) 0.2-0.5 % ophthalmic solution Place 1 drop into both eyes twice a day    . escitalopram (LEXAPRO) 10 MG tablet Take 1 tablet (10 mg total) by mouth daily. 90 tablet 0  . furosemide (LASIX) 20 MG tablet Take 1 tablet (20 mg total) by mouth daily as needed for edema. 90 tablet 1  . loteprednol (LOTEMAX) 0.5 % ophthalmic suspension Place 1 drop into the right eye.     . metoprolol succinate (TOPROL-XL) 25 MG 24 hr tablet Take 1 tablet (25 mg total) by mouth daily. 90 tablet 1  . nortriptyline (PAMELOR) 10 MG capsule Take 2 capsules (20 mg total) by mouth at bedtime. 60 capsule 11  . pantoprazole (PROTONIX) 40 MG tablet TAKE 1 TABLET DAILY 90 tablet 1  . prednisoLONE acetate (PRED FORTE) 1 % ophthalmic suspension Place 1 drop into the left eye twice a day     No current facility-administered medications on file prior to visit.     BP 133/84 (BP Location: Right Arm, Cuff  Size: Normal)   Pulse 71   Temp 99.1 F (37.3 C) (Oral)   Resp 18   Ht 5\' 4"  (1.626 m)   Wt 138 lb 6.4 oz (62.8 kg)   SpO2 100%   BMI 23.76 kg/m       Objective:   Physical Exam  Constitutional: She appears well-developed and well-nourished.  HENT:  Head: Normocephalic and atraumatic.  Mouth/Throat: Oropharynx is clear and moist. No oral lesions. No oropharyngeal exudate, posterior oropharyngeal edema, posterior oropharyngeal erythema or tonsillar abscesses.  Neck:    Tender/enlarged left parotid gland  Cardiovascular: Normal rate, regular rhythm and normal heart sounds.  No murmur heard. Pulmonary/Chest: Effort normal and breath sounds normal. No respiratory distress. She has no wheezes.  Psychiatric: She has a normal mood and affect. Her behavior is normal. Judgment and thought content normal.          Assessment & Plan:  Parotitis- Rapid strep negative. Pt is intolerant to augmentin. Will rx with clindamycin QID x 10 days.  Pt is advised as follows:   Suck on sour candy such as lemon candy for the next few days. You may use tylenol as needed for pain. Call if increased pain, swelling, if fever >101, if you are unable to eat/drink, or if you are not improved in 3-4 days.   Weight loss- she is concerned about weight loss. Has not been eating well the last few days. Prior to that she reports that she was eating well. Plan to bring pt back in 1 month for follow up weight. She had a normal TSH a few months back. If continued weight loss will need to consider further work up at that time.   Wt Readings from Last 3 Encounters:  02/13/18 138 lb 6.4 oz (62.8 kg)  01/06/18 145 lb (65.8 kg)  12/09/17 145 lb (65.8 kg)   Lab Results  Component Value Date   TSH 1.12 09/12/2017

## 2018-02-18 ENCOUNTER — Other Ambulatory Visit: Payer: Self-pay | Admitting: Family

## 2018-02-20 NOTE — Progress Notes (Deleted)
NEUROLOGY FOLLOW UP OFFICE NOTE  Mary Trevino [id number]  HISTORY OF PRESENT ILLNESS: Mary Trevino is an 84 year old right-handed female with hypertension, atypical chest pain, glaucoma and corneal transplant with blindness in the right eye, and history of subarachnoid hemorrhage in September 2017 who follows up for headache.  UPDATE: She stopped gabapentin because it was not effective. Intensity:  *** Duration:  *** Frequency:  *** Frequency of abortive medication: *** Current NSAIDS:  *** Current analgesics: Tylenol Current triptans: None Current ergotamine: None Current anti-emetic: None Current muscle relaxants: None Current anti-anxiolytic: None Current sleep aide: None Current Antihypertensive medications: Toprol-XL, Lasix, amlodipine Current Antidepressant medications: None Current Anticonvulsant medications: none Current anti-CGRP: None Current Vitamins/Herbal/Supplements: Multivitamin Current Antihistamines/Decongestants: None Other therapy: None  Caffeine:  *** Depression:  ***; Anxiety:  *** Other pain:  *** Sleep hygiene:  ***  HISTORY:  Onset: Since September 2017, after she fell out of bed and hit the right side of her head against the wall.  She developed right-sided headache, neck and back pain.  She was admitted to [facility] where CT of head from 12/18/15 revealed small subarachnoid hemorrhage in the right frontal parietal region.  She was admitted for monitoring.  Surgery was not indicated.  Repeat head CT from 01/01/16 demonstrated that it was nearly resolved. Location:  Varies (back of head, top of head, either temple) Quality:  pressure Initial Intensity:  Moderate to severe Aura:  no Prodrome:  no Postdrome:  no Associated symptoms: None. No nausea, vomiting, photophobia, phonophobia, osmophobia, autonomic symptoms or visual disturbance.  She has not had any new worse headache of her life, waking up from sleep Initial Duration:  Usually 10  minutes but sometimes longer Initial Frequency:  Almost daily up to several times a day Initial Frequency of abortive medication: unsure Triggers/exacerbating factors.  None Relieving factors:  Tylenol Activity:  Does not aggravate  MRI of brain without contrast from 04/16/16 was personally reviewed and was unremarkable. Sed rate 33 and CRP 2 from 12/05/17.  She also has chronic neck pain with pain radiating down the left arm.  MRI of cervical spine from 11/20/16 was personally reviewed and demonstrated multilevel cervical degenerative disc disease with left sided foraminal stenosis, most notable at C4-5 and C5-6.  However, she also demonstrates facet hypertrophy at C2-3 causing moderate right foraminal stenosis and broad-based disc osteophyte complex at C3-4 causing moderate foraminal narrowing, worse on the right.  She is followed by neurosurgery/interventional pain management in [facility].  She has received epidural injections that have been ineffective.   Past NSAIDS:  ibuprofen Past analgesics:  tramadol Past abortive triptans:  no Past muscle relaxants:  no Past anti-emetic:  no Past antihypertensive medications:  no Past antidepressant medications:  nortriptyline 20mg  Past anticonvulsant medications:  Lyrica, gabapentin 400mg  twice daily Past vitamins/Herbal/Supplements:  no Past antihistamines/decongestants:  no Other past therapies:  Physical therapy, epidural injections for neck  PAST MEDICAL HISTORY: Past Medical History:  Diagnosis Date  . Glaucoma   . Headache   . Hyperlipidemia   . Hypertension   . Subarachnoid hemorrhage ([place]) 12/2015    MEDICATIONS: Current Outpatient Medications on File Prior to Visit  Medication Sig Dispense Refill  . acetaminophen (TYLENOL) 325 MG tablet Take 650 mg by mouth every 4 (four) hours as needed.    [name] amLODipine (NORVASC) 5 MG tablet Take 1 tablet (5 mg total) by mouth daily. 90 tablet 1  . atorvastatin (LIPITOR) 10 MG tablet TAKE 1  TABLET DAILY 90 tablet 1  . brimonidine-timolol (COMBIGAN) 0.2-0.5 % ophthalmic solution Place 1 drop into both eyes twice a day    . clindamycin (CLEOCIN) 300 MG capsule Take 1 capsule (300 mg total) by mouth 4 (four) times daily. 40 capsule 0  . escitalopram (LEXAPRO) 10 MG tablet Take 1 tablet (10 mg total) by mouth daily. 90 tablet 0  . furosemide (LASIX) 20 MG tablet Take 1 tablet (20 mg total) by mouth daily as needed for edema. 90 tablet 1  . loteprednol (LOTEMAX) 0.5 % ophthalmic suspension Place 1 drop into the right eye.     . metoprolol succinate (TOPROL-XL) 25 MG 24 hr tablet Take 1 tablet (25 mg total) by mouth daily. 90 tablet 1  . nortriptyline (PAMELOR) 10 MG capsule Take 2 capsules (20 mg total) by mouth at bedtime. 60 capsule 11  . pantoprazole (PROTONIX) 40 MG tablet TAKE 1 TABLET DAILY 90 tablet 1  . prednisoLONE acetate (PRED FORTE) 1 % ophthalmic suspension Place 1 drop into the left eye twice a day     No current facility-administered medications on file prior to visit.     ALLERGIES: Allergies  Allergen Reactions  . Augmentin [Amoxicillin-Pot Clavulanate] Diarrhea    FAMILY HISTORY: Family History  Problem Relation Age of Onset  . Hypertension Mother        died of cardiac arrest age 94  . Heart attack Mother   . Other Father        natural causes  . Colon cancer Daughter        died age 48   ***.  SOCIAL HISTORY: Social History   Socioeconomic History  . Marital status: Widowed    Spouse name: Not on file  . Number of children: 3  . Years of education: HS  . Highest education level: Not on file  Occupational History  . 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 Financial resource strain: Not on file  . Food insecurity:    Worry: Not on file    Inability: Not on file  . Transportation needs:    Medical: Not on file    Non-medical: Not on file  Tobacco Use  . Smoking status: Former Smoker    Years: 10.00  . Smokeless tobacco: Never Used    Substance and Sexual Activity  . Alcohol use: No    Alcohol/week: 0.0 standard drinks  . Drug use: No  . Sexual activity: Not on file  Lifestyle  . Physical activity:    Days per week: Not on file    Minutes per session: Not on file  . Stress: Not on file  Relationships  . Social connections:    Talks on phone: Not on file    Gets together: Not on file    Attends religious service: Not on file    Active member of club or organization: Not on file    Attends meetings of clubs or organizations: Not on file    Relationship status: Not on file  . Intimate partner violence:    Fear of current or ex partner: Not on file    Emotionally abused: Not on file    Physically abused: Not on file    Forced sexual activity: Not on file  Other Topics Concern  . 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   No caffeine use.    REVIEW OF SYSTEMS: Constitutional: No fevers, chills, or sweats, no generalized fatigue, change in appetite Eyes: No visual changes, double vision, eye pain Ear, nose and throat: No hearing loss, ear pain, nasal congestion, sore throat Cardiovascular: No chest pain, palpitations Respiratory:  No shortness of breath at rest or with exertion, wheezes GastrointestinaI: No nausea, vomiting, diarrhea, abdominal pain, fecal incontinence Genitourinary:  No dysuria, urinary retention or frequency Musculoskeletal:  No neck pain, back pain Integumentary: No rash, pruritus, skin lesions Neurological: as above Psychiatric: No depression, insomnia, anxiety Endocrine: No palpitations, fatigue, diaphoresis, mood swings, change in appetite, change in weight, increased thirst Hematologic/Lymphatic:  No purpura, petechiae. Allergic/Immunologic: no itchy/runny eyes,  nasal congestion, recent allergic reactions, rashes  PHYSICAL EXAM: *** General: No acute distress.  Patient appears ***-groomed.  *** body habitus. Head:  Normocephalic/atraumatic Eyes:  Fundi examined but not visualized Neck: supple, no paraspinal tenderness, full range of motion Heart:  Regular rate and rhythm Lungs:  Clear to auscultation bilaterally Back: No paraspinal tenderness Neurological Exam: alert and oriented to person, place, and time. Attention span and concentration intact, recent and remote memory intact, fund of knowledge intact.  Speech fluent and not dysarthric, language intact.  CN II-XII intact. Bulk and tone normal, muscle strength 5/5 throughout.  Sensation to light touch, temperature and vibration intact.  Deep tendon reflexes 2+ throughout, toes downgoing.  Finger to nose and heel to shin testing intact.  Gait normal, Romberg negative.  IMPRESSION: ***  PLAN: ***  Metta Clines, DO  CC: ***

## 2018-02-21 ENCOUNTER — Ambulatory Visit: Payer: Medicare Other | Admitting: Neurology

## 2018-02-23 ENCOUNTER — Other Ambulatory Visit: Payer: Self-pay | Admitting: Family

## 2018-02-23 DIAGNOSIS — R55 Syncope and collapse: Secondary | ICD-10-CM

## 2018-03-08 ENCOUNTER — Encounter: Payer: Self-pay | Admitting: Family

## 2018-03-08 ENCOUNTER — Ambulatory Visit (INDEPENDENT_AMBULATORY_CARE_PROVIDER_SITE_OTHER): Payer: Medicare Other | Admitting: Family

## 2018-03-08 VITALS — BP 134/62 | HR 72 | Temp 98.6°F | Ht 64.0 in | Wt 143.2 lb

## 2018-03-08 DIAGNOSIS — G8929 Other chronic pain: Secondary | ICD-10-CM | POA: Diagnosis not present

## 2018-03-08 DIAGNOSIS — R221 Localized swelling, mass and lump, neck: Secondary | ICD-10-CM | POA: Diagnosis not present

## 2018-03-08 DIAGNOSIS — R634 Abnormal weight loss: Secondary | ICD-10-CM | POA: Diagnosis not present

## 2018-03-08 NOTE — Patient Instructions (Signed)
You should be contacted about scheduling your neck ultrasound.  Stop gabapentin, let me know if you have issues with pain after you come off.

## 2018-03-08 NOTE — Progress Notes (Signed)
Subjective:    Patient ID: Mary Trevino, female    DOB: [birth year], 84 y.o.   MRN: [id number]  HPI  Patient is an 84 yr old female who presents today for follow up of her parotitis. We last saw her on 02/13/18.  She was treated with a 10 day course of clindamycin.  She reports that the swelling has improved but "it is still swollen." denies tenderness.   Last visit she was also concerned about weight loss.  Reports improvement in her symptoms.  Wt Readings from Last 3 Encounters:  03/08/18 143 lb 3.2 oz (65 kg)  02/13/18 138 lb 6.4 oz (62.8 kg)  01/06/18 145 lb (65.8 kg)   She also wonders if she can stop gabapentin which she is taking hs only. Denies current pain.   Review of Systems See HPI  Past Medical History:  Diagnosis Date  . Glaucoma   . Headache   . Hyperlipidemia   . Hypertension   . Subarachnoid hemorrhage ([place]) 12/2015     Social History   Socioeconomic History  . Marital status: Widowed    Spouse name: Not on file  . Number of children: 3  . Years of education: HS  . Highest education level: Not on file  Occupational History  . 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 Financial resource strain: Not on file  . Food insecurity:    Worry: Not on file    Inability: Not on file  . Transportation needs:    Medical: Not on file    Non-medical: Not on file  Tobacco Use  . Smoking status: Former Smoker    Years: 10.00  . Smokeless tobacco: Never Used  Substance and Sexual Activity  . Alcohol use: No    Alcohol/week: 0.0 standard drinks  . Drug use: No  . Sexual activity: Not on file  Lifestyle  . Physical activity:    Days per week: Not on file    Minutes per session: Not on file  . Stress: Not on file  Relationships  . Social connections:    Talks on phone: Not on file    Gets together: Not on file    Attends religious service: Not on file    Active member of club or organization: Not on file    Attends meetings of clubs or organizations: Not on  file    Relationship status: Not on file  . Intimate partner violence:    Fear of current or ex partner: Not on file    Emotionally abused: Not on file    Physically abused: Not on file    Forced sexual activity: Not on file  Other Topics Concern  . 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   No caffeine use.    Past Surgical History:  Procedure Laterality Date  . ABDOMINAL HYSTERECTOMY  1975  . CORNEAL TRANSPLANT     2016 (left) 2014 (right)     Family History  Problem Relation Age of Onset  . Hypertension Mother        died of cardiac arrest age 32  . Heart attack Mother   . Other Father        natural causes  . Colon cancer  Daughter        died age 88    Allergies  Allergen Reactions  . Augmentin [Amoxicillin-Pot Clavulanate] Diarrhea    Current Outpatient Medications on File Prior to Visit  Medication Sig Dispense Refill  . acetaminophen (TYLENOL) 325 MG tablet Take 650 mg by mouth every 4 (four) hours as needed.    [name] amLODipine (NORVASC) 5 MG tablet Take 1 tablet (5 mg total) by mouth daily. 90 tablet 1  . atorvastatin (LIPITOR) 10 MG tablet TAKE 1 TABLET DAILY 90 tablet 1  . brimonidine-timolol (COMBIGAN) 0.2-0.5 % ophthalmic solution Place 1 drop into both eyes twice a day    . escitalopram (LEXAPRO) 10 MG tablet Take 1 tablet (10 mg total) by mouth daily. 90 tablet 0  . furosemide (LASIX) 20 MG tablet Take 1 tablet (20 mg total) by mouth daily as needed for edema. 90 tablet 1  . loteprednol (LOTEMAX) 0.5 % ophthalmic suspension Place 1 drop into the right eye.     . nortriptyline (PAMELOR) 10 MG capsule Take 2 capsules (20 mg total) by mouth at bedtime. 60 capsule 11  . pantoprazole (PROTONIX) 40 MG tablet TAKE 1 TABLET DAILY 90 tablet 1  .  prednisoLONE acetate (PRED FORTE) 1 % ophthalmic suspension Place 1 drop into the left eye twice a day    . TOPROL XL 25 MG 24 hr tablet TAKE 1 TABLET DAILY 90 tablet 4   No current facility-administered medications on file prior to visit.     BP 134/62 (BP Location: Right Arm, Patient Position: Sitting, Cuff Size: Normal)   Pulse 72   Temp 98.6 F (37 C) (Oral)   Ht 5\' 4"  (1.626 m)   Wt 143 lb 3.2 oz (65 kg)   SpO2 100%   BMI 24.58 kg/m       Objective:   Physical Exam  Constitutional: She is oriented to person, place, and time. She appears well-developed and well-nourished.  HENT:  Head: Normocephalic and atraumatic.  Neck: Neck supple.    Decreased swelling but gland is still palpable/enlarged at the top of her neck.   Cardiovascular: Normal rate and regular rhythm. Exam reveals no friction rub.  No murmur heard. Musculoskeletal: She exhibits no edema.  Neurological: She is alert and oriented to person, place, and time.  Skin: Skin is warm.          Assessment & Plan:  Neck mass- improving but not resolved. Obtain US to further evaluate.  Weight loss- resolved. Weight is back up. Monitor.   Chronic pain- improved. Advised pt OK to d/c gabapentin. Let me know if pain worsens off the medication.

## 2018-03-16 ENCOUNTER — Ambulatory Visit (HOSPITAL_BASED_OUTPATIENT_CLINIC_OR_DEPARTMENT_OTHER)
Admission: RE | Admit: 2018-03-16 | Discharge: 2018-03-16 | Disposition: A | Discharge status: Home / Self Care | Payer: Medicare Other | Source: Ambulatory Visit | Attending: Family | Admitting: Family

## 2018-03-16 DIAGNOSIS — R221 Localized swelling, mass and lump, neck: Secondary | ICD-10-CM | POA: Insufficient documentation

## 2018-03-19 ENCOUNTER — Other Ambulatory Visit: Payer: Self-pay | Admitting: Family

## 2018-03-22 DIAGNOSIS — Z9883 Filtering (vitreous) bleb after glaucoma surgery status: Secondary | ICD-10-CM | POA: Diagnosis not present

## 2018-03-22 DIAGNOSIS — Z947 Corneal transplant status: Secondary | ICD-10-CM | POA: Diagnosis not present

## 2018-03-22 DIAGNOSIS — H5319 Other subjective visual disturbances: Secondary | ICD-10-CM | POA: Diagnosis not present

## 2018-03-27 DIAGNOSIS — H4010X3 Unspecified open-angle glaucoma, severe stage: Secondary | ICD-10-CM | POA: Diagnosis not present

## 2018-03-27 DIAGNOSIS — H21562 Pupillary abnormality, left eye: Secondary | ICD-10-CM | POA: Diagnosis not present

## 2018-03-27 DIAGNOSIS — Z947 Corneal transplant status: Secondary | ICD-10-CM | POA: Diagnosis not present

## 2018-03-27 DIAGNOSIS — H02403 Unspecified ptosis of bilateral eyelids: Secondary | ICD-10-CM | POA: Diagnosis not present

## 2018-04-01 ENCOUNTER — Other Ambulatory Visit: Payer: Self-pay | Admitting: Family

## 2018-04-20 DIAGNOSIS — Z947 Corneal transplant status: Secondary | ICD-10-CM | POA: Diagnosis not present

## 2018-04-20 DIAGNOSIS — H4089 Other specified glaucoma: Secondary | ICD-10-CM | POA: Diagnosis not present

## 2018-05-05 IMAGING — DX DG CHEST 2V
2 series · 2 of 2 positions shown · non-contrast
Comparison: No prior .

CLINICAL DATA: Cough and congestion.  Fever and chills.

EXAM:
CHEST  2 VIEW

[chest pa]
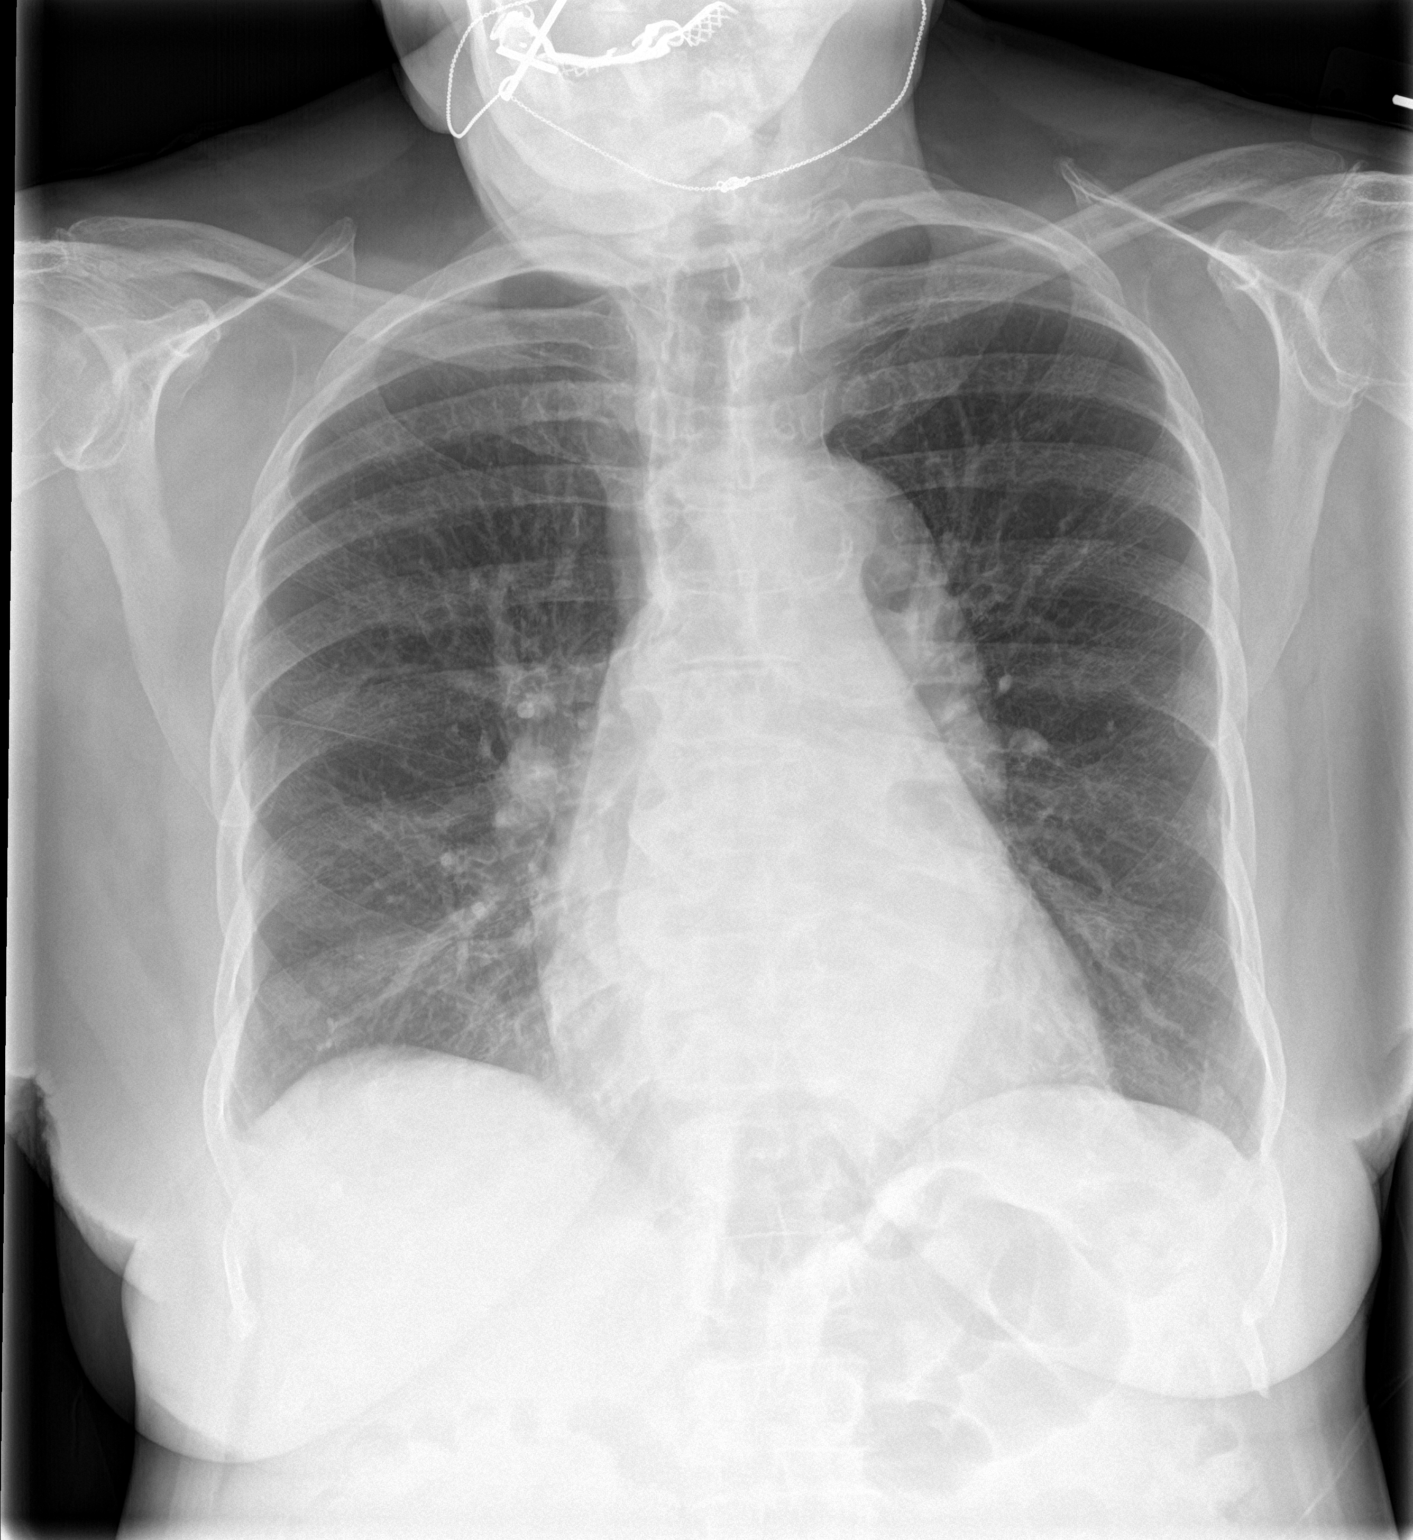

[chest lat]
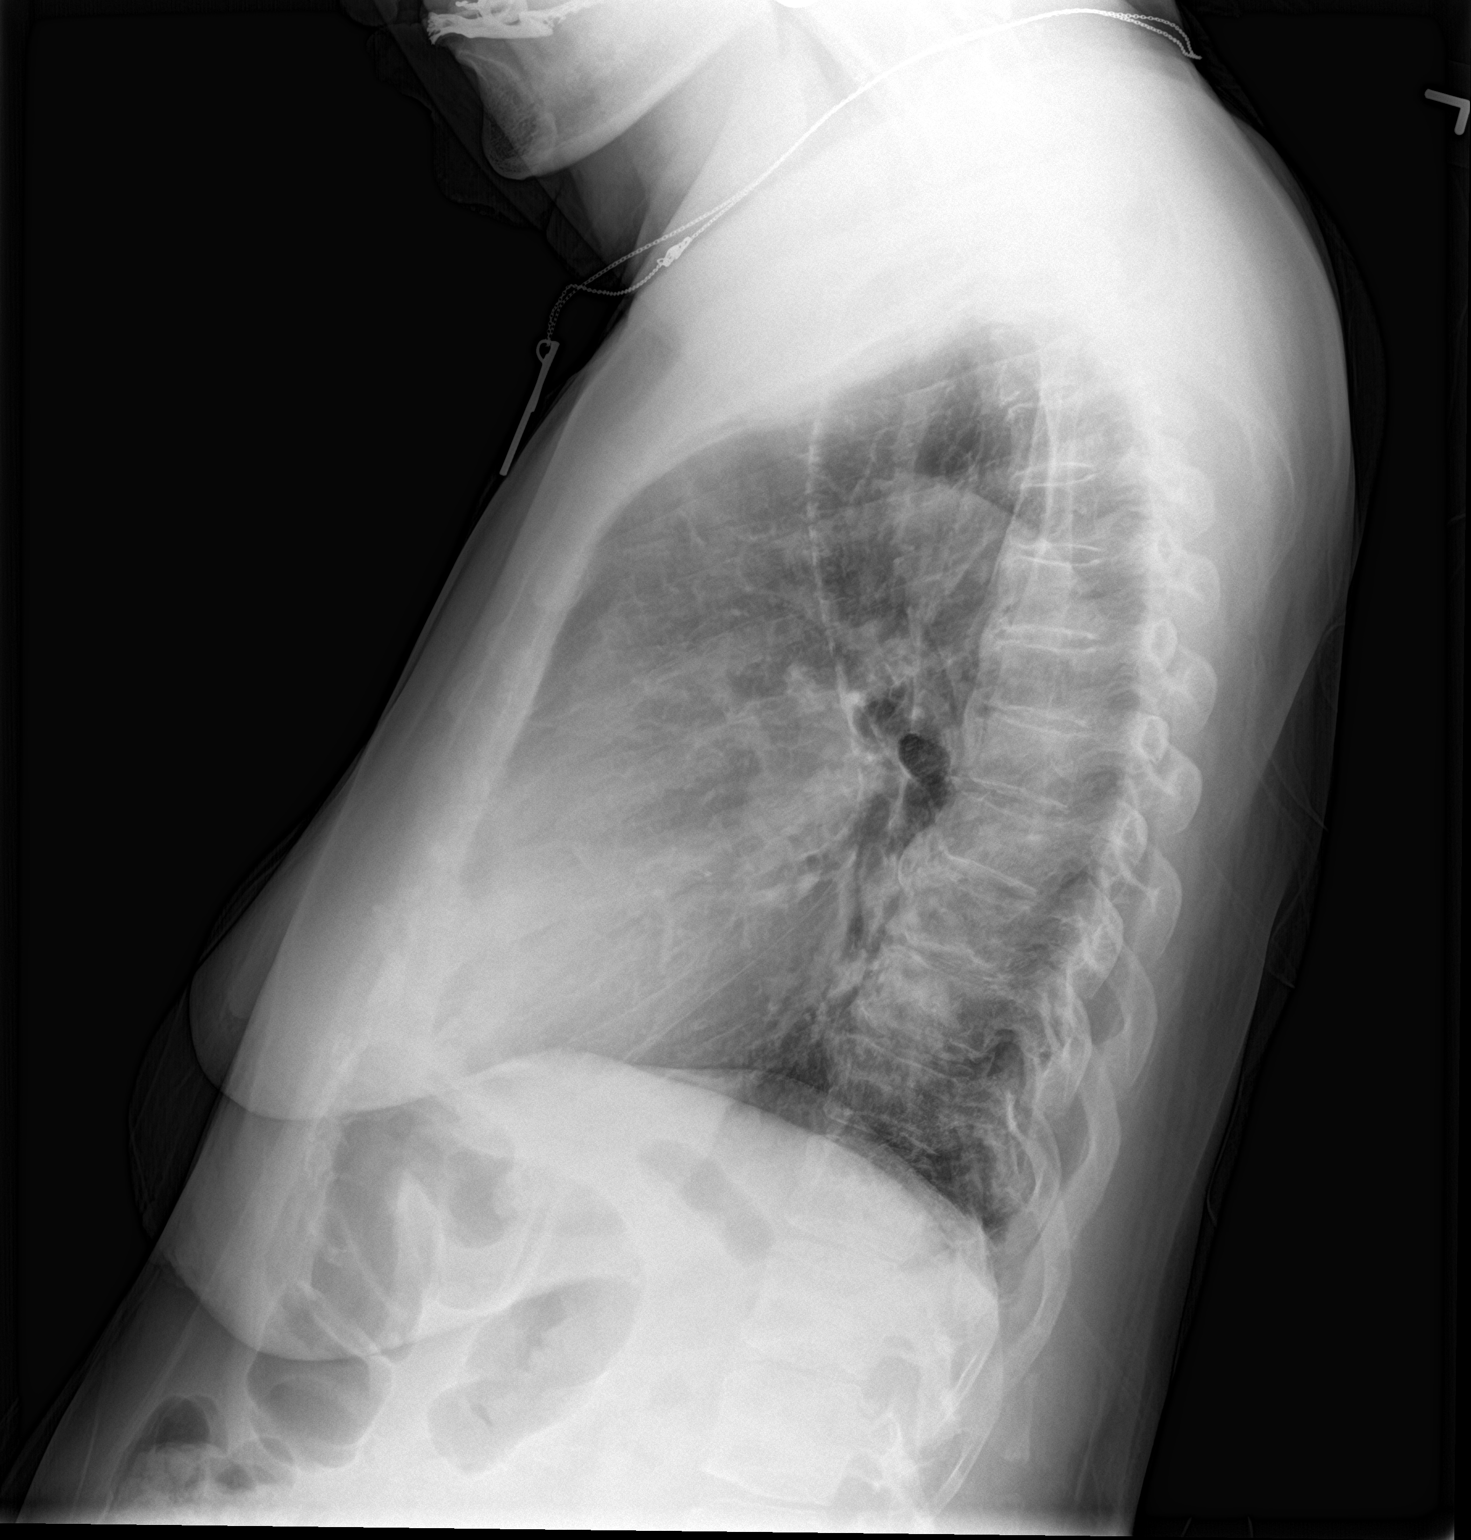

[2 of 2 positions shown; findings below may reference images not displayed]

FINDINGS: Mediastinum hilar structures are unremarkable. Cardiomegaly with
normal pulmonary vascularity. No focal infiltrate. Small bilateral
pleural effusions cannot be excluded. No pneumothorax . Degenerative
changes thoracic spine .
IMPRESSION: 1. Cardiomegaly. No pulmonary venous congestion. Tiny bilateral
pleural effusions noted.
2. No focal pulmonary infiltrate.

## 2018-05-25 DIAGNOSIS — H04123 Dry eye syndrome of bilateral lacrimal glands: Secondary | ICD-10-CM | POA: Diagnosis not present

## 2018-05-25 DIAGNOSIS — H547 Unspecified visual loss: Secondary | ICD-10-CM | POA: Diagnosis not present

## 2018-05-25 DIAGNOSIS — H02403 Unspecified ptosis of bilateral eyelids: Secondary | ICD-10-CM | POA: Diagnosis not present

## 2018-05-25 DIAGNOSIS — H43393 Other vitreous opacities, bilateral: Secondary | ICD-10-CM | POA: Diagnosis not present

## 2018-05-31 DIAGNOSIS — Z947 Corneal transplant status: Secondary | ICD-10-CM | POA: Diagnosis not present

## 2018-05-31 DIAGNOSIS — H4089 Other specified glaucoma: Secondary | ICD-10-CM | POA: Diagnosis not present

## 2018-05-31 DIAGNOSIS — H21562 Pupillary abnormality, left eye: Secondary | ICD-10-CM | POA: Diagnosis not present

## 2018-05-31 DIAGNOSIS — H02403 Unspecified ptosis of bilateral eyelids: Secondary | ICD-10-CM | POA: Diagnosis not present

## 2018-06-13 ENCOUNTER — Encounter: Payer: Self-pay | Admitting: Family

## 2018-06-13 ENCOUNTER — Ambulatory Visit (INDEPENDENT_AMBULATORY_CARE_PROVIDER_SITE_OTHER): Payer: Medicare Other | Admitting: Family

## 2018-06-13 VITALS — BP 128/62 | HR 68 | Temp 99.0°F | Resp 16 | Ht 64.0 in | Wt 147.0 lb

## 2018-06-13 DIAGNOSIS — K219 Gastro-esophageal reflux disease without esophagitis: Secondary | ICD-10-CM

## 2018-06-13 DIAGNOSIS — M5412 Radiculopathy, cervical region: Secondary | ICD-10-CM

## 2018-06-13 DIAGNOSIS — I10 Essential (primary) hypertension: Secondary | ICD-10-CM | POA: Diagnosis not present

## 2018-06-13 DIAGNOSIS — R202 Paresthesia of skin: Secondary | ICD-10-CM | POA: Diagnosis not present

## 2018-06-13 DIAGNOSIS — E785 Hyperlipidemia, unspecified: Secondary | ICD-10-CM

## 2018-06-13 DIAGNOSIS — M5416 Radiculopathy, lumbar region: Secondary | ICD-10-CM

## 2018-06-13 LAB — COMPREHENSIVE METABOLIC PANEL
ALBUMIN: 4 g/dL (ref 3.5–5.2)
ALK PHOS: 44 U/L (ref 39–117)
ALT: 18 U/L (ref 0–35)
AST: 16 U/L (ref 0–37)
BILIRUBIN TOTAL: 0.3 mg/dL (ref 0.2–1.2)
BUN: 17 mg/dL (ref 6–23)
CALCIUM: 9.3 mg/dL (ref 8.4–10.5)
CHLORIDE: 107 meq/L (ref 96–112)
CO2: 30 mEq/L (ref 19–32)
CREATININE: 0.88 mg/dL (ref 0.40–1.20)
GFR: 73.15 mL/min (ref >60.00)
Glucose, Bld: 82 mg/dL (ref 70–99)
Potassium: 4.4 mEq/L (ref 3.5–5.1)
SODIUM: 143 meq/L (ref 135–145)
TOTAL PROTEIN: 6.3 g/dL (ref 6.0–8.3)

## 2018-06-13 LAB — LIPID PANEL
CHOLESTEROL: 192 mg/dL (ref 0–200)
HDL: 72.8 mg/dL (ref >39.00)
LDL CALC: 105 mg/dL — AB (ref 0–99)
NonHDL: 119.12
TRIGLYCERIDES: 72 mg/dL (ref 0.0–149.0)
Total CHOL/HDL Ratio: 3
VLDL: 14.4 mg/dL (ref 0.0–40.0)

## 2018-06-13 MED ORDER — METHYLPREDNISOLONE 4 MG PO TBPK: ORAL_TABLET | ORAL | 0 refills | Status: DC

## 2018-06-13 MED ORDER — FUROSEMIDE 20 MG PO TABS: 20.0000 mg | ORAL_TABLET | Freq: Every day | ORAL | 1 refills | Status: AC | PRN

## 2018-06-13 MED FILL — METHYLPREDNISOLONE 4 MG TAB: 4 | 6 days supply | Qty: 21 | Fill #0

## 2018-06-13 NOTE — Progress Notes (Signed)
Subjective:    Patient ID: Mary Trevino, female    DOB: [birth year], 85 y.o.   MRN: [id number]  HPI  Patient is an 85 yr old female who presents today with chief complaint of right hip pain and left sided back pain.   She has known lumbar disc disease, MRI lumbar spine 1/18 noted the following:   IMPRESSION:  Abnormal MRI scan of the lumbar spine showing prominent spondylitic changes throughout most prominent at L4-5 where there is asymmetric broad-based disc osteophyte bulge to the left resulting in severe left-sided foraminal narrowing and likely involvement of the left L4 and L5 nerve roots. Incidental bilateral simple renal cysts are noted  Of note she has followed with pain management in  [facility]) who has performed some ESI in the past for her cervical neck pain.   Reports pain radiates into the right hip and into the right shoulder.    HTN- maintained on toprol xl 25mg  and amlodipine 5mg .  Uses lasix prn swelling. Reports occasional swelling.  Reports that she takes lasix infrequently.   BP Readings from Last 3 Encounters:  06/13/18 128/62  03/08/18 134/62  02/13/18 133/84   GERD- continues PPI. Reports symptoms are controlled on this medication.   Hyperlipidemia- continues lipitor once daily.  Lab Results  Component Value Date   CHOL 173 07/15/2017   HDL 82.50 07/15/2017   LDLCALC 79 07/15/2017   TRIG 55.0 07/15/2017   CHOLHDL 2 07/15/2017   She reports that her vision has worsened and not "I see very little."  Review of Systems See HPI  Past Medical History:  Diagnosis Date  . Glaucoma   . Headache   . Hyperlipidemia   . Hypertension   . Subarachnoid hemorrhage ([place]) 12/2015     Social History   Socioeconomic History  . Marital status: Widowed    Spouse name: Not on file  . Number of children: 3  . Years of education: HS  . Highest education level: Not on file  Occupational History  . 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  Financial resource strain: Not on file  . Food insecurity:    Worry: Not on file    Inability: Not on file  . Transportation needs:    Medical: Not on file    Non-medical: Not on file  Tobacco Use  . Smoking status: Former Smoker    Years: 10.00  . Smokeless tobacco: Never Used  Substance and Sexual Activity  . Alcohol use: No    Alcohol/week: 0.0 standard drinks  . Drug use: No  . Sexual activity: Not on file  Lifestyle  . Physical activity:    Days per week: Not on file    Minutes per session: Not on file  . Stress: Not on file  Relationships  . Social connections:    Talks on phone: Not on file    Gets together: Not on file    Attends religious service: Not on file    Active member of club or organization: Not on file    Attends meetings of clubs or organizations: Not on file    Relationship status: Not on file  . Intimate partner violence:    Fear of current or ex partner: Not on file    Emotionally abused: Not on file    Physically abused: Not on file    Forced sexual activity: Not on file  Other Topics Concern  . 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   No caffeine use.    Past Surgical History:  Procedure Laterality Date  . ABDOMINAL HYSTERECTOMY  1975  . CORNEAL TRANSPLANT     2016 (left) 2014 (right)     Family History  Problem Relation Age of Onset  . Hypertension Mother        died of cardiac arrest age 30  . Heart attack Mother   . Other Father        natural causes  . Colon cancer Daughter        died age 91    Allergies  Allergen Reactions  . Augmentin [Amoxicillin-Pot Clavulanate] Diarrhea    Current Outpatient Medications on File Prior to Visit  Medication Sig Dispense Refill  . acetaminophen (TYLENOL) 325 MG tablet  Take 650 mg by mouth every 4 (four) hours as needed.    [name] amLODipine (NORVASC) 5 MG tablet Take 1 tablet (5 mg total) by mouth daily. 90 tablet 1  . atorvastatin (LIPITOR) 10 MG tablet TAKE 1 TABLET DAILY 90 tablet 4  . brimonidine-timolol (COMBIGAN) 0.2-0.5 % ophthalmic solution Place 1 drop into both eyes twice a day    . escitalopram (LEXAPRO) 10 MG tablet TAKE 1 TABLET DAILY 90 tablet 1  . furosemide (LASIX) 20 MG tablet Take 1 tablet (20 mg total) by mouth daily as needed for edema. 90 tablet 1  . loteprednol (LOTEMAX) 0.5 % ophthalmic suspension Place 1 drop into the right eye.     . nortriptyline (PAMELOR) 10 MG capsule Take 2 capsules (20 mg total) by mouth at bedtime. 60 capsule 11  . pantoprazole (PROTONIX) 40 MG tablet TAKE 1 TABLET DAILY 90 tablet 1  . prednisoLONE acetate (PRED FORTE) 1 % ophthalmic suspension Place 1 drop into the left eye twice a day    . TOPROL XL 25 MG 24 hr tablet TAKE 1 TABLET DAILY 90 tablet 4   No current facility-administered medications on file prior to visit.     BP 128/62 (BP Location: Right Arm, Patient Position: Sitting, Cuff Size: Small)   Pulse 68   Temp 99 F (37.2 C) (Oral)   Resp 16   Ht 5\' 4"  (1.626 m)   Wt 147 lb (66.7 kg)   SpO2 100%   BMI 25.23 kg/m       Objective:   Physical Exam Constitutional:      Appearance: She is well-developed.  Neck:     Musculoskeletal: Neck supple.     Thyroid: No thyromegaly.  Cardiovascular:     Rate and Rhythm: Normal rate and regular rhythm.     Heart sounds: Normal heart sounds. No murmur.  Pulmonary:     Effort: Pulmonary effort is normal. No respiratory distress.     Breath sounds: Normal breath sounds. No wheezing.  Skin:    General: Skin is warm and dry.  Neurological:     Mental Status: She is alert and oriented to person, place, and time.     Comments: Bilateral UE/LE strength is 5/5  Psychiatric:        Behavior: Behavior normal.        Thought Content: Thought content normal.         Judgment: Judgment normal.  Assessment & Plan:  HTN- bp stable.  Continue current meds. Obtain follow up cmet.   Hyperlipidemia- tolerating statin. Obtain follow up lipid panel.  Lumbar radiculopathy/cervical radiculopathy- trial of medrol dose pak.  GERD- stable on ppi, continue same.

## 2018-06-13 NOTE — Patient Instructions (Signed)
Please complete lab work prior to leaving. Begin medrol dose pak for your pain. Call if symptoms worsen or if they do not improve with the medrol pak.

## 2018-06-21 DIAGNOSIS — T86841 Corneal transplant failure: Secondary | ICD-10-CM | POA: Diagnosis not present

## 2018-06-21 DIAGNOSIS — Z947 Corneal transplant status: Secondary | ICD-10-CM | POA: Diagnosis not present

## 2018-06-21 DIAGNOSIS — H44511 Absolute glaucoma, right eye: Secondary | ICD-10-CM | POA: Diagnosis not present

## 2018-06-21 DIAGNOSIS — H35351 Cystoid macular degeneration, right eye: Secondary | ICD-10-CM | POA: Diagnosis not present

## 2018-06-21 DIAGNOSIS — H47231 Glaucomatous optic atrophy, right eye: Secondary | ICD-10-CM | POA: Diagnosis not present

## 2018-06-22 ENCOUNTER — Telehealth: Payer: Self-pay | Admitting: Family

## 2018-06-22 NOTE — Telephone Encounter (Signed)
Returned call to patient to schedule AWV. Patient did not answer. Will try to call patient again at a later time. SF

## 2018-07-03 ENCOUNTER — Other Ambulatory Visit: Payer: Self-pay | Admitting: Family

## 2018-07-28 ENCOUNTER — Ambulatory Visit (INDEPENDENT_AMBULATORY_CARE_PROVIDER_SITE_OTHER): Payer: Medicare Other | Admitting: Family

## 2018-07-28 ENCOUNTER — Other Ambulatory Visit: Payer: Self-pay

## 2018-07-28 DIAGNOSIS — K1120 Sialoadenitis, unspecified: Secondary | ICD-10-CM

## 2018-07-28 MED ORDER — CLINDAMYCIN HCL 300 MG PO CAPS: 300.0000 mg | ORAL_CAPSULE | Freq: Three times a day (TID) | ORAL | 0 refills | Status: DC

## 2018-07-28 NOTE — Progress Notes (Signed)
Virtual Visit via Video Note  I connected with@ on 07/28/18 at  9:00 AM EDT by a video enabled telemedicine application and verified that I am speaking with the correct person using two identifiers. This visit type was conducted due to national recommendations for restrictions regarding the COVID-19 Pandemic (e.g. social distancing).  This format is felt to be most appropriate for this patient at this time.   I discussed the limitations of evaluation and management by telemedicine and the availability of in person appointments. The patient expressed understanding and agreed to proceed.  Only the patient and myself were on today's video visit. The patient was at home and I was in my office at the time of today's visit.   History of Present Illness:  Patient is an 85 yr old female who presents today with chief complaint of mass under left lower chin/top of neck. Reports that she first noticed swelling about 2-3 days ago. She denies fever.  Reports some tenderness. Reports that the mass is "hard." Denies sore throat or ear pain.    She had similar complaints back in early December prompting an [place] that was unremarkable. She feels as though her swelling is more prominent now than it was back in December.    Observations/Objective:   Gen: Awake, alert, no acute distress Resp: Breathing is even and non-labored Psych: calm/pleasant demeanor Neuro: Alert and Oriented x 3, + facial symmetry, speech is clear. Neck:  + moderate swelling noted beneath left jaw line.  No erythema  Assessment and Plan:  Parotitis- Will rx with clindamycin. Plan follow up video visit in 10 days to re-assess. If no improvement plan to repeat US of neck.  Pt is advised to call if increased pain, swelling, difficulty swallowing or if symptoms are not improved in 3-4 days.   Follow Up Instructions:    I discussed the assessment and treatment plan with the patient. The patient was provided an opportunity to ask questions  and all were answered. The patient agreed with the plan and demonstrated an understanding of the instructions.   The patient was advised to call back or seek an in-person evaluation if the symptoms worsen or if the condition fails to improve as anticipated.    Nance Pear, NP

## 2018-08-09 ENCOUNTER — Encounter: Payer: Self-pay | Admitting: Medical

## 2018-08-09 ENCOUNTER — Ambulatory Visit: Payer: Medicare Other | Admitting: Family

## 2018-08-09 ENCOUNTER — Other Ambulatory Visit: Payer: Self-pay

## 2018-08-09 ENCOUNTER — Ambulatory Visit (INDEPENDENT_AMBULATORY_CARE_PROVIDER_SITE_OTHER): Payer: Medicare Other | Admitting: Medical

## 2018-08-09 VITALS — BP 144/66 | HR 76 | Temp 98.6°F | Resp 16 | Ht 64.0 in | Wt 143.4 lb

## 2018-08-09 DIAGNOSIS — K1120 Sialoadenitis, unspecified: Secondary | ICD-10-CM

## 2018-08-09 DIAGNOSIS — R591 Generalized enlarged lymph nodes: Secondary | ICD-10-CM | POA: Diagnosis not present

## 2018-08-09 DIAGNOSIS — R590 Localized enlarged lymph nodes: Secondary | ICD-10-CM | POA: Diagnosis not present

## 2018-08-09 MED ORDER — KETOROLAC TROMETHAMINE 30 MG/ML IJ SOLN
30.0000 mg | Freq: Once | INTRAMUSCULAR | Status: AC
Start: 2018-08-09 — End: 2018-08-09
  Administered 2018-08-09: 30 mg via INTRAMUSCULAR

## 2018-08-09 NOTE — Addendum Note (Signed)
Addended by: Hinton Dyer on: 08/09/2018 03:23 PM   Modules accepted: Orders

## 2018-08-09 NOTE — Progress Notes (Addendum)
Subjective:    Patient ID: Mary Trevino, female    DOB: [birth year], 85 y.o.   MRN: [id number]  HPI  Pt in office today. She is wearing mask and I kept mask on entire time. Pt states lump on left side of neck present for 3-4 weeks. Pt states size about the same. Pt states just noticed area one day. She states the area hurts to eat. When she turns her head it hurts. Pt states mild ear discomfort the other day but not every day. No sinus pressure. No cough. No chest congestion.  Pt had a virtual visit the other day on 07-28-2018.   Pt states clindamycin did not help at all.   Pt states pain moderate and increases when swallow or turns head.  Review of Systems  Constitutional: Negative for chills, fatigue and fever.  HENT: Positive for ear pain. Negative for congestion, sinus pressure, sinus pain, sore throat and trouble swallowing.        Faint ear pressure.  See hpi.  Respiratory: Negative for cough, chest tightness, shortness of breath and wheezing.   Cardiovascular: Negative for chest pain and palpitations.  Gastrointestinal: Negative for abdominal pain.  Musculoskeletal: Negative for back pain.  Skin: Negative for rash.  Neurological: Negative for dizziness, speech difficulty and headaches.  Hematological: Positive for adenopathy. Does not bruise/bleed easily.  Psychiatric/Behavioral: Negative for behavioral problems.   Past Medical History:  Diagnosis Date  . Glaucoma   . Headache   . Hyperlipidemia   . Hypertension   . Subarachnoid hemorrhage ([place]) 12/2015     Social History   Socioeconomic History  . Marital status: Widowed    Spouse name: Not on file  . Number of children: 3  . Years of education: HS  . Highest education level: Not on file  Occupational History  . 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 Financial resource strain: Not on file  . Food insecurity:    Worry: Not on file    Inability: Not on file  . Transportation needs:    Medical: Not on file   Non-medical: Not on file  Tobacco Use  . Smoking status: Former Smoker    Years: 10.00  . Smokeless tobacco: Never Used  Substance and Sexual Activity  . Alcohol use: No    Alcohol/week: 0.0 standard drinks  . Drug use: No  . Sexual activity: Not on file  Lifestyle  . Physical activity:    Days per week: Not on file    Minutes per session: Not on file  . Stress: Not on file  Relationships  . Social connections:    Talks on phone: Not on file    Gets together: Not on file    Attends religious service: Not on file    Active member of club or organization: Not on file    Attends meetings of clubs or organizations: Not on file    Relationship status: Not on file  . Intimate partner violence:    Fear of current or ex partner: Not on file    Emotionally abused: Not on file    Physically abused: Not on file    Forced sexual activity: Not on file  Other Topics Concern  . 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   No caffeine use.    Past Surgical History:  Procedure Laterality Date  . ABDOMINAL HYSTERECTOMY  1975  . CORNEAL TRANSPLANT     2016 (left) 2014 (right)     Family History  Problem Relation Age of Onset  . Hypertension Mother        died of cardiac arrest age 26  . Heart attack Mother   . Other Father        natural causes  . Colon cancer Daughter        died age 8    Allergies  Allergen Reactions  . Augmentin [Amoxicillin-Pot Clavulanate] Diarrhea    Current Outpatient Medications on File Prior to Visit  Medication Sig Dispense Refill  . acetaminophen (TYLENOL) 325 MG tablet Take 650 mg by mouth every 4 (four) hours as needed.    [name] amLODipine (NORVASC) 5 MG tablet TAKE 1 TABLET DAILY 90 tablet 1  . atorvastatin (LIPITOR) 10 MG tablet  TAKE 1 TABLET DAILY 90 tablet 4  . brimonidine-timolol (COMBIGAN) 0.2-0.5 % ophthalmic solution Place 1 drop into both eyes twice a day    . clindamycin (CLEOCIN) 300 MG capsule Take 1 capsule (300 mg total) by mouth 3 (three) times daily. 30 capsule 0  . escitalopram (LEXAPRO) 10 MG tablet TAKE 1 TABLET DAILY 90 tablet 1  . furosemide (LASIX) 20 MG tablet Take 1 tablet (20 mg total) by mouth daily as needed for edema. 90 tablet 1  . loteprednol (LOTEMAX) 0.5 % ophthalmic suspension Place 1 drop into the right eye.     . methylPREDNISolone (MEDROL DOSEPAK) 4 MG TBPK tablet Please take per package directions 21 tablet 0  . nortriptyline (PAMELOR) 10 MG capsule Take 2 capsules (20 mg total) by mouth at bedtime. 60 capsule 11  . pantoprazole (PROTONIX) 40 MG tablet TAKE 1 TABLET DAILY 90 tablet 1  . prednisoLONE acetate (PRED FORTE) 1 % ophthalmic suspension Place 1 drop into the left eye twice a day    . TOPROL XL 25 MG 24 hr tablet TAKE 1 TABLET DAILY 90 tablet 4   No current facility-administered medications on file prior to visit.     BP (!) 144/66   Pulse 76   Temp 98.6 F (37 C) (Oral)   Resp 16   Ht 5\' 4"  (1.626 m)   Wt 143 lb 6.4 oz (65 kg)   SpO2 100%   BMI 24.61 kg/m       Objective:   Physical Exam  General  Mental Status - Alert. General Appearance - Well groomed. Not in acute distress.  Skin Rashes- No Rashes.  HEENT Head- Normal. Ear Auditory Canal - Left- Normal. Right - Normal.Tympanic Membrane- Left- Normal. Right- Normal. Eye Sclera/Conjunctiva- Left- Normal. Right- Normal. Nose & Sinuses Nasal Mucosa- Left-  Boggy and Congested. Right-  Boggy and  Congested.Bilateral maxillary and frontal sinus pressure. Mouth & Throat Lips: Upper Lip- Normal: no dryness, cracking, pallor, cyanosis, or vesicular eruption. Lower Lip-Normal: no dryness, cracking, pallor, cyanosis or vesicular eruption.   Mouth-Pt has swollen area at base of tongue. That is tender. This  lump is about 73mm x 4 mm in left side lesser sublingual  duct region.. Feels hard and tender.  Buccal Mucosa- Bilateral- No Aphthous ulcers.(pt removed dentures and tissue underneath appears normal. Oropharynx- No Discharge or Erythema. Tonsils: Characteristics- Bilateral- No Erythema or Congestion. Size/Enlargement- Bilateral- No enlargement. Discharge- bilateral-None.  Faint swelling over parotid gland but  not much.  Neck Neck- Supple. No Masses.left submandibular area swollen about 4 cm x 2 cm by palpation.   Chest and Lung Exam Auscultation: Breath Sounds:-Clear even and unlabored.  Cardiovascular Auscultation:Rythm- Regular, rate and rhythm. Murmurs & Other Heart Sounds:Ausculatation of the heart reveal- No Murmurs.  Lymphatic See heent exam.       Assessment & Plan:  Patient has had swollen left submandibular region for about 3 weeks.  In addition some faint parotid region swelling and swollen area under the tongue/sublingual area area.  Patient was treated for parotitis. Clindamycin rx'd by her PCP.  Despite treatment with clindamycin no improvement has occurred.  The areas are still painful.  Hurts to eat and turn her neck.  The submandibular node region is quite swollen by palpation.  Ultrasound done about 1 week ago did not show any enlargement.  Patient may need further imaging studies but in light of the fact that she did not improve at all/worsen.  I thought it would be good idea to get patient in with ears nose and throat specialist within the next day or 2.  Waiting appointment date presently.  Pt very anxious to leave today. As I was trying to schedule  her appointment now and give her information today as to plan/appointment. If can't get appointment with ent by tomorrow will talk to radiologist on further imaging studies. Toradol 30 mg im given attempt to help her with pain/discomfort.   May give other antiboitic but will send note her pcp first and see if can  get quick ent visit. Also ask staff to advise her to suck on hard candy that can help with parotitis.

## 2018-08-09 NOTE — Patient Instructions (Addendum)
Patient has had swollen left submandibular region for about 3 weeks.  In addition some faint parotid region swelling and swollen area under the tongue/sublingual area area.  Patient was treated for parotitis and Clinlindamycin rx'd by her PCP.  Despite treatment with clindamycin no improvement has occurred.  The areas are still painful.  Hurts to eat and turn her neck.  The submandibular node region is quite swollen by palpation.  Ultrasound done about 1 week ago did not show any enlargement.  Patient may need further imaging studies but in light of the fact that she did not improve at all/worsen.  I thought it would be good idea to get patient in with ears nose and throat specialist within the next day or 2.  Waiting appointment date presently.

## 2018-08-10 ENCOUNTER — Telehealth: Payer: Self-pay | Admitting: Medical

## 2018-08-10 LAB — CBC WITH DIFFERENTIAL/PLATELET
Basophils Absolute: 0.1 10*3/uL (ref 0.0–0.1)
Basophils Relative: 1 % (ref 0.0–3.0)
Eosinophils Absolute: 0.1 10*3/uL (ref 0.0–0.7)
Eosinophils Relative: 2 % (ref 0.0–5.0)
HCT: 38.3 % (ref 36.0–46.0)
Hemoglobin: 12.7 g/dL (ref 12.0–15.0)
Lymphocytes Relative: 20.6 % (ref 12.0–46.0)
Lymphs Abs: 1.3 10*3/uL (ref 0.7–4.0)
MCHC: 33.1 g/dL (ref 30.0–36.0)
MCV: 84.8 fl (ref 78.0–100.0)
Monocytes Absolute: 0.5 10*3/uL (ref 0.1–1.0)
Monocytes Relative: 8.5 % (ref 3.0–12.0)
Neutro Abs: 4.2 10*3/uL (ref 1.4–7.7)
Neutrophils Relative %: 67.9 % (ref 43.0–77.0)
Platelets: 231 10*3/uL (ref 150.0–400.0)
RBC: 4.51 Mil/uL (ref 3.87–5.11)
RDW: 15.6 % — ABNORMAL HIGH (ref 11.5–15.5)
WBC: 6.3 10*3/uL (ref 4.0–10.5)

## 2018-08-10 NOTE — Telephone Encounter (Signed)
Pt of Melissa. Recently swollen lymph node and possible parotitis. We got her appointment with ENT. Is she aware. I gave her toradol injection. Did that help. Let me know how she is. Might give additional antibiotic though she did not respond to clindamycin. Would like upate on how she is before the weekend. Her appointment with ent is on monday

## 2018-08-10 NOTE — Telephone Encounter (Signed)
Opened to review 

## 2018-08-14 NOTE — Telephone Encounter (Signed)
Thanks for helping out. How was she doing? Was she in a lot of pain?

## 2018-08-14 NOTE — Telephone Encounter (Signed)
Patient missed appointment this morning at 10 am, per [facility] ent she can come in Wed at 8:30. Patient advised of appointment date, time and address.

## 2018-08-16 DIAGNOSIS — K111 Hypertrophy of salivary gland: Secondary | ICD-10-CM | POA: Diagnosis not present

## 2018-08-16 DIAGNOSIS — Z87891 Personal history of nicotine dependence: Secondary | ICD-10-CM | POA: Diagnosis not present

## 2018-08-21 DIAGNOSIS — K115 Sialolithiasis: Secondary | ICD-10-CM | POA: Diagnosis not present

## 2018-08-21 DIAGNOSIS — E041 Nontoxic single thyroid nodule: Secondary | ICD-10-CM | POA: Diagnosis not present

## 2018-09-08 ENCOUNTER — Other Ambulatory Visit: Payer: Self-pay

## 2018-09-08 ENCOUNTER — Ambulatory Visit: Payer: Medicare Other | Admitting: Family

## 2018-09-08 DIAGNOSIS — Z5329 Procedure and treatment not carried out because of patient's decision for other reasons: Secondary | ICD-10-CM

## 2018-09-08 DIAGNOSIS — Z91199 Patient's noncompliance with other medical treatment and regimen due to unspecified reason: Secondary | ICD-10-CM

## 2018-09-08 NOTE — Progress Notes (Signed)
  Pt was not reachable by facetime, cell phone or home phone at scheduled appointment time.

## 2018-09-13 ENCOUNTER — Ambulatory Visit: Payer: Medicare Other | Admitting: Family

## 2018-09-21 DIAGNOSIS — H4053X4 Glaucoma secondary to other eye disorders, bilateral, indeterminate stage: Secondary | ICD-10-CM | POA: Diagnosis not present

## 2018-09-21 DIAGNOSIS — T86841 Corneal transplant failure: Secondary | ICD-10-CM | POA: Diagnosis not present

## 2018-09-25 DIAGNOSIS — T86841 Corneal transplant failure: Secondary | ICD-10-CM | POA: Diagnosis not present

## 2018-09-25 DIAGNOSIS — Z961 Presence of intraocular lens: Secondary | ICD-10-CM | POA: Diagnosis not present

## 2018-10-28 DIAGNOSIS — Z01818 Encounter for other preprocedural examination: Secondary | ICD-10-CM | POA: Diagnosis not present

## 2018-10-28 DIAGNOSIS — Z1159 Encounter for screening for other viral diseases: Secondary | ICD-10-CM | POA: Diagnosis not present

## 2018-10-28 DIAGNOSIS — T86841 Corneal transplant failure: Secondary | ICD-10-CM | POA: Diagnosis not present

## 2018-10-31 DIAGNOSIS — H52222 Regular astigmatism, left eye: Secondary | ICD-10-CM | POA: Diagnosis not present

## 2018-10-31 DIAGNOSIS — T86841 Corneal transplant failure: Secondary | ICD-10-CM | POA: Diagnosis not present

## 2018-11-06 ENCOUNTER — Telehealth: Payer: Self-pay | Admitting: Family

## 2018-11-06 NOTE — Telephone Encounter (Signed)
Pt request refill for amLODipine (NORVASC) 5 MG tablet    Pharmacy:  [place], [place] [phone] (Phone) [phone] (Fax)

## 2018-11-08 ENCOUNTER — Other Ambulatory Visit: Payer: Self-pay

## 2018-11-08 MED ORDER — AMLODIPINE BESYLATE 5 MG PO TABS: 5.0000 mg | ORAL_TABLET | Freq: Every day | ORAL | 1 refills | Status: DC

## 2018-11-08 NOTE — Telephone Encounter (Signed)
rx sent in for patient

## 2018-12-01 DIAGNOSIS — T86841 Corneal transplant failure: Secondary | ICD-10-CM | POA: Diagnosis not present

## 2018-12-09 DIAGNOSIS — Z1159 Encounter for screening for other viral diseases: Secondary | ICD-10-CM | POA: Diagnosis not present

## 2018-12-12 DIAGNOSIS — Z79899 Other long term (current) drug therapy: Secondary | ICD-10-CM | POA: Diagnosis not present

## 2018-12-12 DIAGNOSIS — Z87891 Personal history of nicotine dependence: Secondary | ICD-10-CM | POA: Diagnosis not present

## 2018-12-12 DIAGNOSIS — M1990 Unspecified osteoarthritis, unspecified site: Secondary | ICD-10-CM | POA: Diagnosis not present

## 2018-12-12 DIAGNOSIS — H1820 Unspecified corneal edema: Secondary | ICD-10-CM | POA: Diagnosis not present

## 2018-12-12 DIAGNOSIS — H21512 Anterior synechiae (iris), left eye: Secondary | ICD-10-CM | POA: Diagnosis not present

## 2018-12-12 DIAGNOSIS — R51 Headache: Secondary | ICD-10-CM | POA: Diagnosis not present

## 2018-12-12 DIAGNOSIS — T86841 Corneal transplant failure: Secondary | ICD-10-CM | POA: Diagnosis not present

## 2018-12-12 DIAGNOSIS — I10 Essential (primary) hypertension: Secondary | ICD-10-CM | POA: Diagnosis not present

## 2018-12-27 ENCOUNTER — Ambulatory Visit: Payer: Self-pay | Admitting: *Deleted

## 2018-12-27 NOTE — Telephone Encounter (Signed)
Summary: lump in throat    Patient requesting a call back from NT to discuss "lump" in throat that makes it somewhat difficult to swallow. Patient denies any difficulty breathing.      Patient reports swollen gland on left side of throat- it is sore and making it hard to swallow due to pain. Patient states she does not have a fever.  Call to office to see if patient can be seen for this - or if this would be a virtual- it will have to be virtual and they are going to set that up. Reason for Disposition . [1] Sore throat is the only symptom AND [2] present > 48 hours  Answer Assessment - Initial Assessment Questions 1. ONSET: "When did the throat start hurting?" (Hours or days ago)      3 days now 2. SEVERITY: "How bad is the sore throat?" (Scale 1-10; mild, moderate or severe)   - MILD (1-3):  doesn't interfere with eating or normal activities   - MODERATE (4-7): interferes with eating some solids and normal activities   - SEVERE (8-10):  excruciating pain, interferes with most normal activities   - SEVERE DYSPHAGIA: can't swallow liquids, drooling     moderate 3. STREP EXPOSURE: "Has there been any exposure to strep within the past week?" If so, ask: "What type of contact occurred?"      no 4.  VIRAL SYMPTOMS: "Are there any symptoms of a cold, such as a runny nose, cough, hoarse voice or red eyes?"      no 5. FEVER: "Do you have a fever?" If so, ask: "What is your temperature, how was it measured, and when did it start?"     no 6. PUS ON THE TONSILS: "Is there pus on the tonsils in the back of your throat?"     no 7. OTHER SYMPTOMS: "Do you have any other symptoms?" (e.g., difficulty breathing, headache, rash)     no 8. PREGNANCY: "Is there any chance you are pregnant?" "When was your last menstrual period?"     n/a  Protocols used: SORE THROAT-A-AH

## 2018-12-27 NOTE — Telephone Encounter (Signed)
Appt scheduled

## 2018-12-28 ENCOUNTER — Other Ambulatory Visit: Payer: Self-pay

## 2018-12-28 ENCOUNTER — Ambulatory Visit (INDEPENDENT_AMBULATORY_CARE_PROVIDER_SITE_OTHER): Payer: Medicare Other | Admitting: Family Medicine

## 2018-12-28 ENCOUNTER — Ambulatory Visit: Payer: Medicare Other | Admitting: Family Medicine

## 2018-12-28 ENCOUNTER — Encounter: Payer: Self-pay | Admitting: Family Medicine

## 2018-12-28 DIAGNOSIS — K115 Sialolithiasis: Secondary | ICD-10-CM | POA: Diagnosis not present

## 2018-12-28 DIAGNOSIS — M7989 Other specified soft tissue disorders: Secondary | ICD-10-CM | POA: Diagnosis not present

## 2018-12-28 MED ORDER — CLINDAMYCIN HCL 300 MG PO CAPS
300.0000 mg | ORAL_CAPSULE | Freq: Three times a day (TID) | ORAL | 0 refills | Status: AC
Start: 2018-12-28 — End: ?

## 2018-12-28 NOTE — Progress Notes (Signed)
Chief Complaint  Patient presents with  . Sore Throat    Subjective: Patient is a 85 y.o. female here for enlarged gland on L. Due to COVID-19 pandemic, we are interacting via telephone. I verified patient's ID using 2 identifiers. Patient agreed to proceed with visit via this method. Patient is at home, I am at office. Patient and I are present for visit.   Pt has a hx of submand gland stone and inflammation. Did well with clinda. Saw ENT, elected not to pursue a procedure. This episode started 4 d ago. No fevers, drainage, growth, injury.   ROS: Const: no fevers  Past Medical History:  Diagnosis Date  . Glaucoma   . Headache   . Hyperlipidemia   . Hypertension   . Subarachnoid hemorrhage ([place]) 12/2015    Objective: No conversational dyspnea Age appropriate judgment and insight Nml affect and mood  Assessment and Plan: Soft tissue mass - Plan: clindamycin (CLEOCIN) 300 MG capsule  Sialolithiasis - Plan: clindamycin (CLEOCIN) 300 MG capsule  Pt has known stone on imaging, has seen ENT. Will repeat clinda as she is allergic to Augmentin. She will let usknow if there are issues, but I would lean towards having her f/u w Dr. Laurance Flatten of ENT if no improvement.  Total time spent: 10:33 The patient voiced understanding and agreement to the plan.  [place], DO 12/28/18  9:43 AM

## 2019-01-01 ENCOUNTER — Telehealth: Payer: Self-pay | Admitting: Family

## 2019-01-01 NOTE — Telephone Encounter (Signed)
Contacted patient in regards to phone call and to after hours call.  She states that she is feeling much better.  Advised to continue and finish up antibiotic as it is helping and to make sure she follows up with ENT.  Patient agreed.

## 2019-01-01 NOTE — Telephone Encounter (Signed)
Pt had telephone visit with dr Nani Ravens on 12-28-2018 and was prescribed cleocin for soft tissue mass the medication just started working yesterday and pt would like to know if she should continue. Pt has 5 more days. Please advise

## 2019-01-01 NOTE — Telephone Encounter (Signed)
FYI

## 2019-01-01 NOTE — Telephone Encounter (Signed)
Patient wanted to know if the antibiotic that she has been taking the clindamycin can cause blurry vision?  She stated since she has been taking it her vision has been blurry.

## 2019-01-01 NOTE — Telephone Encounter (Signed)
Patient requesting call back from [place] regarding.

## 2019-01-01 NOTE — Telephone Encounter (Signed)
Not associated with blurry vision.

## 2019-01-01 NOTE — Telephone Encounter (Signed)
After hours call.  Contact Type Call Who Is Calling Patient / Member / Family / Caregiver Call Type Triage / Clinical Caller Name [name] Relationship To Patient Daughter Return Phone Number [phone] (Primary) Chief Complaint Neck Pain Reason for Call Symptomatic / Request for Health Information Initial Comment Caller states her mother has something in her neck/ throat. She was given antibiotic. PT is still in pain. Was told she has something in her gland by ENT. Translation No Nurse Assessment Nurse: Zenia Resides, RN, [name] Date/Time Eilene Ghazi Time): 12/31/2018 9:14:03 AM Confirm and document reason for call. If symptomatic, describe symptoms. ---Caller states her mother has something in her neck/throat. She was given antibiotic on Thursday. Pt is still in pain. There is a gland swollen in her mouth. States she can't swallow well or eat very well. OTC meds not helping for pain. No fever.

## 2019-01-02 NOTE — Telephone Encounter (Signed)
Patient notified.  She will call her eye doctor.

## 2019-01-12 ENCOUNTER — Other Ambulatory Visit: Payer: Self-pay

## 2019-01-12 ENCOUNTER — Ambulatory Visit (INDEPENDENT_AMBULATORY_CARE_PROVIDER_SITE_OTHER): Payer: Medicare Other

## 2019-01-12 DIAGNOSIS — Z23 Encounter for immunization: Secondary | ICD-10-CM

## 2019-03-02 ENCOUNTER — Other Ambulatory Visit: Payer: Self-pay

## 2019-03-28 ENCOUNTER — Telehealth: Payer: Self-pay | Admitting: Family

## 2019-03-28 DIAGNOSIS — R55 Syncope and collapse: Secondary | ICD-10-CM

## 2019-03-28 MED ORDER — METOPROLOL SUCCINATE ER 25 MG PO TB24: 25.0000 mg | ORAL_TABLET | Freq: Every day | ORAL | 1 refills | Status: DC

## 2019-03-28 MED ORDER — AMLODIPINE BESYLATE 5 MG PO TABS: 5.0000 mg | ORAL_TABLET | Freq: Every day | ORAL | 1 refills | Status: DC

## 2019-03-28 NOTE — Telephone Encounter (Signed)
Medication Refill - Medication:  TOPROL XL 25 MG 24 hr tablet [id number] amLODipine (NORVASC) 5 MG tablet [id number]   Has the patient contacted their pharmacy? No. (Agent: If no, request that the patient contact the pharmacy for the refill.) (Agent: If yes, when and what did the pharmacy advise?)  Preferred Pharmacy (with phone number or street name): [facility]   Agent: Please be advised that RX refills may take up to 3 business days. We ask that you follow-up with your pharmacy.

## 2019-03-29 ENCOUNTER — Telehealth: Payer: Self-pay

## 2019-03-29 MED ORDER — AMLODIPINE BESYLATE 5 MG PO TABS: 5.0000 mg | ORAL_TABLET | Freq: Every day | ORAL | 1 refills | Status: AC

## 2019-03-29 NOTE — Telephone Encounter (Signed)
RX resent

## 2019-03-29 NOTE — Telephone Encounter (Signed)
Copied from [place] [id number]. Topic: General - Other >> Mar 29, 2019  9:43 AM Leward Quan A wrote: Reason for CRM: Patient called to say that the Rx for amLODipine (NORVASC) 5 MG tablet sent on 03/28/2019 was not received per [facility]

## 2019-04-03 ENCOUNTER — Other Ambulatory Visit: Payer: Self-pay | Admitting: Family

## 2019-04-03 ENCOUNTER — Other Ambulatory Visit: Payer: Self-pay

## 2019-04-03 DIAGNOSIS — R55 Syncope and collapse: Secondary | ICD-10-CM

## 2019-04-03 MED ORDER — METOPROLOL SUCCINATE ER 25 MG PO TB24: 25.0000 mg | ORAL_TABLET | Freq: Every day | ORAL | 0 refills | Status: AC

## 2019-04-03 MED ORDER — METOPROLOL SUCCINATE ER 25 MG PO TB24: 25.0000 mg | ORAL_TABLET | Freq: Every day | ORAL | 0 refills | Status: DC

## 2019-04-03 NOTE — Telephone Encounter (Signed)
I just now see that you already sent ruth- do you mind cancelling my refill at [facility] please?

## 2019-04-03 NOTE — Telephone Encounter (Signed)
I sent a 30 day supply to [facility]  

## 2019-04-03 NOTE — Telephone Encounter (Signed)
Pt called in stating she would like a short supply of medications sent to [facility] on [address], as she only has 2 pills. Pt states [facility] will not happen till after Dec.26th.

## 2019-04-03 NOTE — Addendum Note (Signed)
Addended by: Debbrah Alar on: 04/03/2019 01:08 PM   Modules accepted: Orders

## 2019-04-03 NOTE — Telephone Encounter (Signed)
30 days suppy sent to her pharmacy

## 2019-04-04 NOTE — Telephone Encounter (Signed)
done

## 2019-04-17 ENCOUNTER — Emergency Department (HOSPITAL_BASED_OUTPATIENT_CLINIC_OR_DEPARTMENT_OTHER)
Admission: EM | Admit: 2019-04-17 | Discharge: 2019-04-17 | Disposition: A | Discharge status: Home / Self Care | Payer: Medicare Other | Attending: Emergency Medicine | Admitting: Emergency Medicine

## 2019-04-17 ENCOUNTER — Other Ambulatory Visit: Payer: Self-pay

## 2019-04-17 ENCOUNTER — Encounter (HOSPITAL_BASED_OUTPATIENT_CLINIC_OR_DEPARTMENT_OTHER): Payer: Self-pay

## 2019-04-17 ENCOUNTER — Emergency Department (HOSPITAL_BASED_OUTPATIENT_CLINIC_OR_DEPARTMENT_OTHER): Payer: Medicare Other

## 2019-04-17 DIAGNOSIS — U071 COVID-19: Secondary | ICD-10-CM | POA: Insufficient documentation

## 2019-04-17 DIAGNOSIS — R509 Fever, unspecified: Secondary | ICD-10-CM | POA: Insufficient documentation

## 2019-04-17 DIAGNOSIS — Z79899 Other long term (current) drug therapy: Secondary | ICD-10-CM | POA: Insufficient documentation

## 2019-04-17 DIAGNOSIS — R05 Cough: Secondary | ICD-10-CM | POA: Diagnosis present

## 2019-04-17 DIAGNOSIS — R1030 Lower abdominal pain, unspecified: Secondary | ICD-10-CM | POA: Diagnosis not present

## 2019-04-17 DIAGNOSIS — Z87891 Personal history of nicotine dependence: Secondary | ICD-10-CM | POA: Insufficient documentation

## 2019-04-17 DIAGNOSIS — M7918 Myalgia, other site: Secondary | ICD-10-CM | POA: Diagnosis not present

## 2019-04-17 DIAGNOSIS — R5383 Other fatigue: Secondary | ICD-10-CM | POA: Insufficient documentation

## 2019-04-17 DIAGNOSIS — N390 Urinary tract infection, site not specified: Secondary | ICD-10-CM | POA: Insufficient documentation

## 2019-04-17 DIAGNOSIS — I10 Essential (primary) hypertension: Secondary | ICD-10-CM | POA: Insufficient documentation

## 2019-04-17 HISTORY — DX: Legal blindness, as defined in USA: H54.8

## 2019-04-17 LAB — SARS CORONAVIRUS 2 AG (30 MIN TAT): SARS Coronavirus 2 Ag: POSITIVE — AB

## 2019-04-17 LAB — URINALYSIS, MICROSCOPIC (REFLEX)

## 2019-04-17 LAB — URINALYSIS, ROUTINE W REFLEX MICROSCOPIC
Bilirubin Urine: NEGATIVE
Glucose, UA: NEGATIVE mg/dL
Ketones, ur: NEGATIVE mg/dL
Nitrite: POSITIVE — AB
Protein, ur: NEGATIVE mg/dL
Specific Gravity, Urine: 1.015 (ref 1.005–1.030)
pH: 6.5 (ref 5.0–8.0)

## 2019-04-17 MED ORDER — BENZONATATE 100 MG PO CAPS: 100.0000 mg | ORAL_CAPSULE | Freq: Three times a day (TID) | ORAL | 0 refills | Status: AC

## 2019-04-17 MED ORDER — CEPHALEXIN 500 MG PO CAPS: 500.0000 mg | ORAL_CAPSULE | Freq: Two times a day (BID) | ORAL | 0 refills | Status: AC

## 2019-04-17 MED FILL — BENZONATATE 100 MG CAPS: 100 | 7 days supply | Qty: 21 | Fill #0

## 2019-04-17 MED FILL — CEPHALEXIN 500 MG CAPSULE: 500 | 7 days supply | Qty: 14 | Fill #0

## 2019-04-17 NOTE — ED Triage Notes (Signed)
Pt c/o flu like sx x 3 days with +covid exposure-NAD-slow gait

## 2019-04-17 NOTE — Discharge Instructions (Signed)
Potential COVID 19 treatment with monoclonal antibody: You may call [phone] Otherwise, we recommend supportive care, tylenol for fever/body aches, continued monitoring of your symptoms and return for new or worsening symptoms such as worsening severe fatigue or shortness of breath.  You also may have a urinary tract infection given your urinary symptoms and what we are seeing on your urinalysis and have been given antibiotics.

## 2019-04-17 NOTE — ED Notes (Signed)
Per pt- daughter tested positive for covid on Friday.

## 2019-04-17 NOTE — ED Notes (Signed)
Pt was ambulatory to treatment room with steady gait. No dyspnea noted.

## 2019-04-17 NOTE — ED Provider Notes (Signed)
[facility] EMERGENCY DEPARTMENT Provider Note   CSN: [id number] Arrival date & time: 04/17/19  1124     History Chief Complaint  Patient presents with  . Cough    Mary Trevino is a 86 y.o. female.  HPI    Cough, thought it was bad cold Body aches Temperature 100.8 3-4 days of symptoms Daughter tested positive for COVID 19 on Friday Fatigue, generalized weakness Lives with daughter and son in law BP running high No nausea, vomiting or diarrhea Some abdominal pain across lower abdomen 5-6 times per night getting up to go to the bathroom No chest pain, no dyspnea  Past Medical History:  Diagnosis Date  . Glaucoma   . Headache   . Hyperlipidemia   . Hypertension   . Legally blind   . Subarachnoid hemorrhage ([place]) 12/2015    Patient Active Problem List   Diagnosis Date Noted  . Chronic nonintractable headache 12/05/2017  . Spinal stenosis in cervical region 12/03/2016  . Myofascial pain dysfunction syndrome 12/03/2016  . Overactive bladder 09/21/2016  . History of corneal transplant 08/19/2016  . Depression 06/21/2016  . Cervico-occipital neuralgia of the right side 04/22/2016  . Right lumbar radiculopathy 04/01/2016  . Headache 04/01/2016  . History of subarachnoid hemorrhage 12/23/2015  . Legally blind 11/12/2015  . HTN (hypertension) 09/10/2015  . Glaucoma 09/10/2015  . Hyperlipidemia 09/10/2015  . Atypical chest pain 09/10/2015    Past Surgical History:  Procedure Laterality Date  . ABDOMINAL HYSTERECTOMY  1975  . CORNEAL TRANSPLANT     2016 (left) 2014 (right)      OB History   No obstetric history on file.     Family History  Problem Relation Age of Onset  . Hypertension Mother        died of cardiac arrest age 64  . Heart attack Mother   . Other Father        natural causes  . Colon cancer Daughter        died age 64    Social History   Tobacco Use  . Smoking status: Former Smoker    Years: 10.00  . Smokeless tobacco:  Never Used  Substance Use Topics  . Alcohol use: No    Alcohol/week: 0.0 standard drinks  . Drug use: No    Home Medications Prior to Admission medications   Medication Sig Start Date End Date Taking? Authorizing Provider  acetaminophen (TYLENOL) 325 MG tablet Take 650 mg by mouth every 4 (four) hours as needed. 12/20/15   [provider]  amLODipine (NORVASC) 5 MG tablet Take 1 tablet (5 mg total) by mouth daily. 03/29/19   Debbrah Alar, NP  atorvastatin (LIPITOR) 10 MG tablet TAKE 1 TABLET DAILY 04/02/18   Debbrah Alar, NP  benzonatate (TESSALON) 100 MG capsule Take 1 capsule (100 mg total) by mouth every 8 (eight) hours. 04/17/19   Gareth Morgan, MD  brimonidine-timolol (COMBIGAN) 0.2-0.5 % ophthalmic solution Place 1 drop into both eyes twice a day    [provider]  cephALEXin (KEFLEX) 500 MG capsule Take 1 capsule (500 mg total) by mouth 2 (two) times daily for 7 days. 04/17/19 04/24/19  Gareth Morgan, MD  clindamycin (CLEOCIN) 300 MG capsule Take 1 capsule (300 mg total) by mouth 3 (three) times daily. 12/28/18   Shelda Pal, DO  escitalopram (LEXAPRO) 10 MG tablet TAKE 1 TABLET DAILY 03/20/18   Debbrah Alar, NP  furosemide (LASIX) 20 MG tablet Take 1 tablet (20 mg  total) by mouth daily as needed for edema. 06/13/18   Debbrah Alar, NP  loteprednol (LOTEMAX) 0.5 % ophthalmic suspension Place 1 drop into the right eye.  09/24/15   [provider]  methylPREDNISolone (MEDROL DOSEPAK) 4 MG TBPK tablet Please take per package directions 06/13/18   Debbrah Alar, NP  metoprolol succinate (TOPROL XL) 25 MG 24 hr tablet Take 1 tablet (25 mg total) by mouth daily. 04/03/19   Debbrah Alar, NP  nortriptyline (PAMELOR) 10 MG capsule Take 2 capsules (20 mg total) by mouth at bedtime. 12/05/17   Marcial Pacas, MD  pantoprazole (PROTONIX) 40 MG tablet TAKE 1 TABLET DAILY 02/18/18   Debbrah Alar, NP  prednisoLONE acetate (PRED  FORTE) 1 % ophthalmic suspension Place 1 drop into the left eye twice a day    [provider]    Allergies    Augmentin [amoxicillin-pot clavulanate]  Review of Systems   Review of Systems  Constitutional: Positive for activity change, appetite change, chills, fatigue and fever.  HENT: Positive for congestion. Negative for sore throat.   Eyes: Negative for visual disturbance.  Respiratory: Positive for cough. Negative for shortness of breath.   Cardiovascular: Negative for chest pain.  Gastrointestinal: Positive for abdominal pain. Negative for diarrhea, nausea and vomiting.  Genitourinary: Positive for frequency and urgency. Negative for difficulty urinating.  Musculoskeletal: Negative for back pain and neck pain.  Skin: Negative for rash.  Neurological: Negative for syncope and headaches.    Physical Exam Updated Vital Signs BP (!) 166/83 (BP Location: Right Arm)   Pulse 61   Temp 99.8 F (37.7 C) (Oral)   Resp (!) 21   Ht 5\' 4"  (1.626 m)   Wt 63.5 kg   SpO2 100%   BMI 24.03 kg/m   Physical Exam Vitals and nursing note reviewed.  Constitutional:      General: She is not in acute distress.    Appearance: She is well-developed. She is not diaphoretic.  HENT:     Head: Normocephalic and atraumatic.  Eyes:     Conjunctiva/sclera: Conjunctivae normal.  Cardiovascular:     Rate and Rhythm: Normal rate and regular rhythm.     Heart sounds: Normal heart sounds. No murmur. No friction rub. No gallop.   Pulmonary:     Effort: Pulmonary effort is normal. No respiratory distress.     Breath sounds: Normal breath sounds. No wheezing or rales.  Abdominal:     General: There is no distension.     Palpations: Abdomen is soft.     Tenderness: There is no abdominal tenderness. There is no guarding.  Musculoskeletal:        General: No tenderness.     Cervical back: Normal range of motion.  Skin:    General: Skin is warm and dry.     Findings: No erythema or rash.    Neurological:     Mental Status: She is alert and oriented to person, place, and time.     ED Results / Procedures / Treatments   Labs (all labs ordered are listed, but only abnormal results are displayed) Labs Reviewed  SARS CORONAVIRUS 2 AG (30 MIN TAT) - Abnormal; Notable for the following components:      Result Value   SARS Coronavirus 2 Ag POSITIVE (*)    All other components within normal limits  URINALYSIS, ROUTINE W REFLEX MICROSCOPIC - Abnormal; Notable for the following components:   APPearance CLOUDY (*)    Hgb urine dipstick TRACE (*)  Nitrite POSITIVE (*)    Leukocytes,Ua TRACE (*)    All other components within normal limits  URINALYSIS, MICROSCOPIC (REFLEX) - Abnormal; Notable for the following components:   Bacteria, UA MANY (*)    All other components within normal limits  URINE CULTURE    EKG None  Radiology DG Chest Portable 1 View  Result Date: 04/17/2019 CLINICAL DATA:  Cough and shortness of breath.  Hypertension. EXAM: PORTABLE CHEST 1 VIEW COMPARISON:  April 01, 2017 FINDINGS: There is no appreciable edema or consolidation. Heart is mildly enlarged with pulmonary vascularity normal. No adenopathy. Mild prominence of the aorta is stable. There is aortic atherosclerosis. There is degenerative change in the thoracic spine. No adenopathy evident. IMPRESSION: No edema or consolidation. Stable cardiac prominence. Prominence of the aorta may reflect chronic hypertension and appears stable. Aortic Atherosclerosis (ICD10-I70.0). Electronically Signed   By: Lowella Grip III M.D.   On: 04/17/2019 13:02    Procedures Procedures (including critical care time)  Medications Ordered in ED Medications - No data to display  ED Course  I have reviewed the triage vital signs and the nursing notes.  Pertinent labs & imaging results that were available during my care of the patient were reviewed by me and considered in my medical decision making (see chart for  details).    MDM Rules/Calculators/A&P                      86 year old female with history above presents with concern for cough, body aches, congestion and fevers for 3 days in the setting of daughter testing positive for COVID-19 on Friday.  Chest x-ray shows no evidence of pneumonia, otherwise appears stable.  She is reporting some urinary symptoms, with urinalysis showing trace leukocytes and positive nitrites, feels reasonable to treat for possible UTI in setting of symptoms.  Symptoms overall are consistent with COVID-19 infection, and had a rapid antigen test which confirmed positive.  Recommend continued supportive care, provided number for monoclonal antibody hotline.  She has no shortness of breath, oxygen levels remain high, 99 to 100%, no dyspnea on exertion, and do not have indication for admission to the hospital at this time.  Discussed reasons to return to the emergency department in detail. Patient discharged in stable condition with understanding of reasons to return.    Final Clinical Impression(s) / ED Diagnoses Final diagnoses:  [id number]  Urinary tract infection without hematuria, site unspecified    Rx / DC Orders ED Discharge Orders         Ordered    cephALEXin (KEFLEX) 500 MG capsule  2 times daily     04/17/19 1635    benzonatate (TESSALON) 100 MG capsule  Every 8 hours     04/17/19 1635           Gareth Morgan, MD 04/17/19 1723

## 2019-04-18 ENCOUNTER — Telehealth: Payer: Self-pay | Admitting: Family

## 2019-04-18 NOTE — Telephone Encounter (Signed)
Daughter called to obtain COVID results.  Advised pt.'s daughter of COVID results showing the virus was detected, and the pt. can spread the germ to other people.  Reported onset of symptoms of cough and fever on 04/14/19.  Reported the pt. went to the ER yesterday.  Advised of CDC guidelines for self isolation/ ending isolation.  Advised of safe practice guidelines. Symptom Tier reviewed.  Encouraged to monitor for any worsening symptoms; watch for increased shortness of breath, weakness, and signs of dehydration. Advised when to seek emergency care.  Instructed to rest and hydrate well.  Advised to leave the house during recommended isolation period, only if it is necessary to seek medical care.  Advised the Health Dept. will be notified.  Pt. Verb. understanding of instructions.   Will send result to PCP.

## 2019-04-19 LAB — URINE CULTURE: Culture: >=100000 — AB

## 2019-04-20 ENCOUNTER — Telehealth: Payer: Self-pay | Admitting: *Deleted

## 2019-04-20 NOTE — Telephone Encounter (Signed)
Post ED Visit - Positive Culture Follow-up  Culture report reviewed by antimicrobial stewardship pharmacist: [place] Team []  Elenor Quinones, Pharm.D. []  Heide Guile, Pharm.D., BCPS AQ-ID []  Parks Neptune, Pharm.D., BCPS []  Alycia Rossetti, Pharm.D., BCPS []  [place], [place].D., BCPS, AAHIVP []  Legrand Como, Pharm.D., BCPS, AAHIVP []  Salome Arnt, PharmD, BCPS []  Johnnette Gourd, PharmD, BCPS []  Hughes Better, PharmD, BCPS []  Leeroy Cha, PharmD []  Laqueta Linden, PharmD, BCPS []  Albertina Parr, PharmD  [place] Team []  Leodis Sias, PharmD []  Lindell Spar, PharmD []  Royetta Asal, PharmD []  Graylin Shiver, Rph []  Rema Fendt) Glennon Mac, PharmD []  Arlyn Dunning, PharmD []  Netta Cedars, PharmD []  Dia Sitter, PharmD []  Leone Haven, PharmD []  Gretta Arab, PharmD []  Theodis Shove, PharmD []  Peggyann Juba, PharmD []  Reuel Boom, PharmD Richardine Service, PharmD  Positive urine culture Treated with Cephalexin, organism sensitive to the same and no further patient follow-up is required at this time.  Harlon Flor [facility] 04/20/2019, 11:17 AM

## 2019-04-25 ENCOUNTER — Other Ambulatory Visit: Payer: Self-pay

## 2019-04-25 ENCOUNTER — Emergency Department (HOSPITAL_BASED_OUTPATIENT_CLINIC_OR_DEPARTMENT_OTHER): Payer: Medicare Other

## 2019-04-25 ENCOUNTER — Encounter (HOSPITAL_BASED_OUTPATIENT_CLINIC_OR_DEPARTMENT_OTHER): Payer: Self-pay | Admitting: *Deleted

## 2019-04-25 ENCOUNTER — Inpatient Hospital Stay (HOSPITAL_BASED_OUTPATIENT_CLINIC_OR_DEPARTMENT_OTHER)
Admission: EM | Admit: 2019-04-25 | Discharge: 2019-05-14 | DRG: 177 | Disposition: E | Payer: Medicare Other | Source: Ambulatory Visit | Attending: Internal Medicine | Admitting: Internal Medicine

## 2019-04-25 DIAGNOSIS — I472 Ventricular tachycardia: Secondary | ICD-10-CM | POA: Diagnosis not present

## 2019-04-25 DIAGNOSIS — Z79899 Other long term (current) drug therapy: Secondary | ICD-10-CM

## 2019-04-25 DIAGNOSIS — H548 Legal blindness, as defined in USA: Secondary | ICD-10-CM | POA: Diagnosis present

## 2019-04-25 DIAGNOSIS — I10 Essential (primary) hypertension: Secondary | ICD-10-CM | POA: Diagnosis present

## 2019-04-25 DIAGNOSIS — Z8679 Personal history of other diseases of the circulatory system: Secondary | ICD-10-CM

## 2019-04-25 DIAGNOSIS — Z515 Encounter for palliative care: Secondary | ICD-10-CM | POA: Diagnosis not present

## 2019-04-25 DIAGNOSIS — H409 Unspecified glaucoma: Secondary | ICD-10-CM | POA: Diagnosis present

## 2019-04-25 DIAGNOSIS — J982 Interstitial emphysema: Secondary | ICD-10-CM | POA: Diagnosis not present

## 2019-04-25 DIAGNOSIS — E860 Dehydration: Secondary | ICD-10-CM | POA: Diagnosis present

## 2019-04-25 DIAGNOSIS — R05 Cough: Secondary | ICD-10-CM | POA: Diagnosis not present

## 2019-04-25 DIAGNOSIS — U071 COVID-19: Principal | ICD-10-CM

## 2019-04-25 DIAGNOSIS — J9601 Acute respiratory failure with hypoxia: Secondary | ICD-10-CM | POA: Diagnosis present

## 2019-04-25 DIAGNOSIS — R339 Retention of urine, unspecified: Secondary | ICD-10-CM | POA: Diagnosis not present

## 2019-04-25 DIAGNOSIS — Z800 Family history of malignant neoplasm of digestive organs: Secondary | ICD-10-CM

## 2019-04-25 DIAGNOSIS — R0689 Other abnormalities of breathing: Secondary | ICD-10-CM | POA: Diagnosis not present

## 2019-04-25 DIAGNOSIS — Z947 Corneal transplant status: Secondary | ICD-10-CM

## 2019-04-25 DIAGNOSIS — R0602 Shortness of breath: Secondary | ICD-10-CM

## 2019-04-25 DIAGNOSIS — J1282 Pneumonia due to coronavirus disease 2019: Secondary | ICD-10-CM | POA: Diagnosis present

## 2019-04-25 DIAGNOSIS — N179 Acute kidney failure, unspecified: Secondary | ICD-10-CM | POA: Diagnosis not present

## 2019-04-25 DIAGNOSIS — J939 Pneumothorax, unspecified: Secondary | ICD-10-CM | POA: Diagnosis not present

## 2019-04-25 DIAGNOSIS — Z8249 Family history of ischemic heart disease and other diseases of the circulatory system: Secondary | ICD-10-CM | POA: Diagnosis not present

## 2019-04-25 DIAGNOSIS — N390 Urinary tract infection, site not specified: Secondary | ICD-10-CM | POA: Diagnosis not present

## 2019-04-25 DIAGNOSIS — R918 Other nonspecific abnormal finding of lung field: Secondary | ICD-10-CM | POA: Diagnosis not present

## 2019-04-25 DIAGNOSIS — Z66 Do not resuscitate: Secondary | ICD-10-CM | POA: Diagnosis present

## 2019-04-25 DIAGNOSIS — E785 Hyperlipidemia, unspecified: Secondary | ICD-10-CM | POA: Diagnosis present

## 2019-04-25 DIAGNOSIS — J9851 Mediastinitis: Secondary | ICD-10-CM | POA: Diagnosis not present

## 2019-04-25 DIAGNOSIS — Z87891 Personal history of nicotine dependence: Secondary | ICD-10-CM

## 2019-04-25 DIAGNOSIS — E870 Hyperosmolality and hypernatremia: Secondary | ICD-10-CM | POA: Diagnosis not present

## 2019-04-25 LAB — CBC WITH DIFFERENTIAL/PLATELET
Abs Immature Granulocytes: 0.13 10*3/uL — ABNORMAL HIGH (ref 0.00–0.07)
Basophils Absolute: 0 10*3/uL (ref 0.0–0.1)
Basophils Relative: 0 %
Eosinophils Absolute: 0 10*3/uL (ref 0.0–0.5)
Eosinophils Relative: 0 %
HCT: 45 % (ref 36.0–46.0)
Hemoglobin: 14.7 g/dL (ref 12.0–15.0)
Immature Granulocytes: 1 %
Lymphocytes Relative: 7 %
Lymphs Abs: 0.8 10*3/uL (ref 0.7–4.0)
MCH: 27.7 pg (ref 26.0–34.0)
MCHC: 32.7 g/dL (ref 30.0–36.0)
MCV: 84.9 fL (ref 80.0–100.0)
Monocytes Absolute: 0.6 10*3/uL (ref 0.1–1.0)
Monocytes Relative: 5 %
Neutro Abs: 9.5 10*3/uL — ABNORMAL HIGH (ref 1.7–7.7)
Neutrophils Relative %: 87 %
Platelets: 275 10*3/uL (ref 150–400)
RBC: 5.3 MIL/uL — ABNORMAL HIGH (ref 3.87–5.11)
RDW: 14.2 % (ref 11.5–15.5)
WBC: 11 10*3/uL — ABNORMAL HIGH (ref 4.0–10.5)
nRBC: 0 % (ref 0.0–0.2)

## 2019-04-25 LAB — COMPREHENSIVE METABOLIC PANEL
ALT: 52 U/L — ABNORMAL HIGH (ref 0–44)
AST: 55 U/L — ABNORMAL HIGH (ref 15–41)
Albumin: 3.9 g/dL (ref 3.5–5.0)
Alkaline Phosphatase: 38 U/L (ref 38–126)
Anion gap: 11 (ref 5–15)
BUN: 21 mg/dL (ref 8–23)
CO2: 28 mmol/L (ref 22–32)
Calcium: 9.9 mg/dL (ref 8.9–10.3)
Chloride: 101 mmol/L (ref 98–111)
Creatinine, Ser: 0.99 mg/dL (ref 0.44–1.00)
GFR calc Af Amer: 58 mL/min — ABNORMAL LOW (ref >60)
GFR calc non Af Amer: 50 mL/min — ABNORMAL LOW (ref >60)
Glucose, Bld: 157 mg/dL — ABNORMAL HIGH (ref 70–99)
Potassium: 4.2 mmol/L (ref 3.5–5.1)
Sodium: 140 mmol/L (ref 135–145)
Total Bilirubin: 0.5 mg/dL (ref 0.3–1.2)
Total Protein: 7.4 g/dL (ref 6.5–8.1)

## 2019-04-25 LAB — CBC
HCT: 43.4 % (ref 36.0–46.0)
Hemoglobin: 14.5 g/dL (ref 12.0–15.0)
MCH: 28.3 pg (ref 26.0–34.0)
MCHC: 33.4 g/dL (ref 30.0–36.0)
MCV: 84.6 fL (ref 80.0–100.0)
Platelets: 244 10*3/uL (ref 150–400)
RBC: 5.13 MIL/uL — ABNORMAL HIGH (ref 3.87–5.11)
RDW: 14.4 % (ref 11.5–15.5)
WBC: 11 10*3/uL — ABNORMAL HIGH (ref 4.0–10.5)
nRBC: 0 % (ref 0.0–0.2)

## 2019-04-25 LAB — CREATININE, SERUM
Creatinine, Ser: 0.98 mg/dL (ref 0.44–1.00)
GFR calc Af Amer: 59 mL/min — ABNORMAL LOW (ref >60)
GFR calc non Af Amer: 51 mL/min — ABNORMAL LOW (ref >60)

## 2019-04-25 LAB — GLUCOSE, CAPILLARY
Glucose-Capillary: 167 mg/dL — ABNORMAL HIGH (ref 70–99)
Glucose-Capillary: 174 mg/dL — ABNORMAL HIGH (ref 70–99)

## 2019-04-25 LAB — TYPE AND SCREEN
ABO/RH(D): O POS
Antibody Screen: NEGATIVE

## 2019-04-25 LAB — FIBRINOGEN: Fibrinogen: >800 mg/dL — ABNORMAL HIGH (ref 210–475)

## 2019-04-25 LAB — C-REACTIVE PROTEIN: CRP: 32.8 mg/dL — ABNORMAL HIGH (ref <1.0)

## 2019-04-25 LAB — FERRITIN: Ferritin: 582 ng/mL — ABNORMAL HIGH (ref 11–307)

## 2019-04-25 LAB — LACTIC ACID, PLASMA: Lactic Acid, Venous: 1.6 mmol/L (ref 0.5–1.9)

## 2019-04-25 LAB — TRIGLYCERIDES: Triglycerides: 128 mg/dL (ref <150)

## 2019-04-25 LAB — D-DIMER, QUANTITATIVE: D-Dimer, Quant: 1.89 ug/mL-FEU — ABNORMAL HIGH (ref 0.00–0.50)

## 2019-04-25 LAB — LACTATE DEHYDROGENASE: LDH: 380 U/L — ABNORMAL HIGH (ref 98–192)

## 2019-04-25 LAB — PROCALCITONIN: Procalcitonin: 0.41 ng/mL

## 2019-04-25 MED ORDER — NORTRIPTYLINE HCL 10 MG PO CAPS
20.0000 mg | ORAL_CAPSULE | Freq: Every day | ORAL | Status: DC
  Administered 2019-04-25 – 2019-05-04 (×10): 20 mg via ORAL
  Filled 2019-04-25 (×11): qty 2

## 2019-04-25 MED ORDER — ONDANSETRON HCL 4 MG PO TABS
4.0000 mg | ORAL_TABLET | Freq: Four times a day (QID) | ORAL | Status: DC | PRN
  Filled 2019-04-25: qty 1

## 2019-04-25 MED ORDER — BRIMONIDINE TARTRATE 0.2 % OP SOLN
1.0000 [drp] | Freq: Two times a day (BID) | OPHTHALMIC | Status: DC
  Filled 2019-04-25: qty 5

## 2019-04-25 MED ORDER — ATORVASTATIN CALCIUM 10 MG PO TABS
10.0000 mg | ORAL_TABLET | Freq: Every day | ORAL | Status: DC
  Administered 2019-04-25 – 2019-05-05 (×10): 10 mg via ORAL
  Filled 2019-04-25 (×11): qty 1

## 2019-04-25 MED ORDER — METOPROLOL SUCCINATE ER 25 MG PO TB24
25.0000 mg | ORAL_TABLET | Freq: Every day | ORAL | Status: DC
  Administered 2019-04-25 – 2019-04-28 (×4): 25 mg via ORAL
  Filled 2019-04-25 (×4): qty 1

## 2019-04-25 MED ORDER — SODIUM CHLORIDE 0.9% FLUSH
3.0000 mL | Freq: Two times a day (BID) | INTRAVENOUS | Status: DC
  Administered 2019-04-25 – 2019-05-06 (×22): 3 mL via INTRAVENOUS

## 2019-04-25 MED ORDER — NORTRIPTYLINE HCL 10 MG PO CAPS
20.0000 mg | ORAL_CAPSULE | Freq: Every day | ORAL | Status: DC
  Filled 2019-04-25: qty 2

## 2019-04-25 MED ORDER — DEXAMETHASONE SODIUM PHOSPHATE 10 MG/ML IJ SOLN
6.0000 mg | Freq: Once | INTRAMUSCULAR | Status: AC
  Administered 2019-04-25: 6 mg via INTRAVENOUS
  Filled 2019-04-25: qty 1

## 2019-04-25 MED ORDER — GUAIFENESIN-DM 100-10 MG/5ML PO SYRP
10.0000 mL | ORAL_SOLUTION | ORAL | Status: DC | PRN
  Administered 2019-05-06: 10 mL via ORAL
  Filled 2019-04-25: qty 10

## 2019-04-25 MED ORDER — TIMOLOL MALEATE 0.5 % OP SOLN
1.0000 [drp] | Freq: Two times a day (BID) | OPHTHALMIC | Status: DC
  Filled 2019-04-25: qty 5

## 2019-04-25 MED ORDER — ATORVASTATIN CALCIUM 10 MG PO TABS: 10.0000 mg | ORAL_TABLET | Freq: Every day | ORAL | Status: DC

## 2019-04-25 MED ORDER — HYDROCOD POLST-CPM POLST ER 10-8 MG/5ML PO SUER
5.0000 mL | Freq: Two times a day (BID) | ORAL | Status: DC | PRN
  Administered 2019-05-06: 5 mL via ORAL
  Filled 2019-04-25: qty 5

## 2019-04-25 MED ORDER — ASCORBIC ACID 500 MG PO TABS
500.0000 mg | ORAL_TABLET | Freq: Every day | ORAL | Status: DC
  Administered 2019-04-25 – 2019-05-05 (×10): 500 mg via ORAL
  Filled 2019-04-25 (×11): qty 1

## 2019-04-25 MED ORDER — SODIUM CHLORIDE 0.9 % IV SOLN
100.0000 mg | INTRAVENOUS | Status: AC
  Administered 2019-04-25 (×2): 100 mg via INTRAVENOUS
  Filled 2019-04-25 (×2): qty 20

## 2019-04-25 MED ORDER — BRIMONIDINE TARTRATE 0.2 % OP SOLN
1.0000 [drp] | Freq: Two times a day (BID) | OPHTHALMIC | Status: DC
  Administered 2019-04-25 – 2019-05-06 (×14): 1 [drp] via OPHTHALMIC
  Filled 2019-04-25: qty 5

## 2019-04-25 MED ORDER — TOCILIZUMAB 400 MG/20ML IV SOLN
400.0000 mg | Freq: Once | INTRAVENOUS | Status: AC
  Administered 2019-04-25: 23:00:00 400 mg via INTRAVENOUS
  Filled 2019-04-25: qty 20

## 2019-04-25 MED ORDER — BRIMONIDINE TARTRATE-TIMOLOL 0.2-0.5 % OP SOLN: 1.0000 [drp] | Freq: Two times a day (BID) | OPHTHALMIC | Status: DC

## 2019-04-25 MED ORDER — ZINC SULFATE 220 (50 ZN) MG PO CAPS
220.0000 mg | ORAL_CAPSULE | Freq: Every day | ORAL | Status: DC
  Administered 2019-04-25 – 2019-05-05 (×10): 220 mg via ORAL
  Filled 2019-04-25 (×11): qty 1

## 2019-04-25 MED ORDER — METOPROLOL SUCCINATE ER 25 MG PO TB24: 25.0000 mg | ORAL_TABLET | Freq: Every day | ORAL | Status: DC

## 2019-04-25 MED ORDER — LOTEPREDNOL ETABONATE 0.5 % OP SUSP
1.0000 [drp] | Freq: Four times a day (QID) | OPHTHALMIC | Status: DC
  Administered 2019-04-26 – 2019-05-06 (×36): 1 [drp] via OPHTHALMIC
  Filled 2019-04-25: qty 5

## 2019-04-25 MED ORDER — POLYETHYLENE GLYCOL 3350 17 G PO PACK: 17.0000 g | PACK | Freq: Every day | ORAL | Status: DC | PRN

## 2019-04-25 MED ORDER — SODIUM CHLORIDE 0.9 % IV SOLN
1.0000 g | Freq: Once | INTRAVENOUS | Status: AC
  Administered 2019-04-25: 14:00:00 1 g via INTRAVENOUS
  Filled 2019-04-25: qty 10

## 2019-04-25 MED ORDER — FUROSEMIDE 20 MG PO TABS: 20.0000 mg | ORAL_TABLET | Freq: Every day | ORAL | Status: DC | PRN

## 2019-04-25 MED ORDER — SODIUM CHLORIDE 0.9 % IV SOLN
500.0000 mg | Freq: Once | INTRAVENOUS | Status: AC
  Administered 2019-04-25: 14:00:00 500 mg via INTRAVENOUS
  Filled 2019-04-25: qty 500

## 2019-04-25 MED ORDER — ESCITALOPRAM OXALATE 10 MG PO TABS
10.0000 mg | ORAL_TABLET | Freq: Every day | ORAL | Status: DC
  Administered 2019-04-25 – 2019-05-05 (×10): 10 mg via ORAL
  Filled 2019-04-25 (×11): qty 1

## 2019-04-25 MED ORDER — ACETAMINOPHEN 325 MG PO TABS
650.0000 mg | ORAL_TABLET | Freq: Four times a day (QID) | ORAL | Status: DC | PRN
  Administered 2019-04-25 – 2019-04-26 (×2): 650 mg via ORAL
  Filled 2019-04-25 (×2): qty 2

## 2019-04-25 MED ORDER — PANTOPRAZOLE SODIUM 40 MG PO TBEC: 40.0000 mg | DELAYED_RELEASE_TABLET | Freq: Every day | ORAL | Status: DC

## 2019-04-25 MED ORDER — ONDANSETRON HCL 4 MG/2ML IJ SOLN
4.0000 mg | Freq: Four times a day (QID) | INTRAMUSCULAR | Status: DC | PRN
  Administered 2019-04-26 – 2019-04-28 (×3): 4 mg via INTRAVENOUS
  Filled 2019-04-25 (×3): qty 2

## 2019-04-25 MED ORDER — SODIUM CHLORIDE 0.9 % IV SOLN: Freq: Once | INTRAVENOUS | Status: AC

## 2019-04-25 MED ORDER — ESCITALOPRAM OXALATE 10 MG PO TABS
10.0000 mg | ORAL_TABLET | Freq: Every day | ORAL | Status: DC
  Filled 2019-04-25: qty 1

## 2019-04-25 MED ORDER — INSULIN ASPART 100 UNIT/ML ~~LOC~~ SOLN
0.0000 [IU] | Freq: Three times a day (TID) | SUBCUTANEOUS | Status: DC
  Administered 2019-04-25: 2 [IU] via SUBCUTANEOUS
  Administered 2019-04-26: 17:00:00 3 [IU] via SUBCUTANEOUS
  Administered 2019-04-26 (×2): 2 [IU] via SUBCUTANEOUS
  Administered 2019-04-27 (×2): 3 [IU] via SUBCUTANEOUS
  Administered 2019-04-27 – 2019-04-28 (×2): 2 [IU] via SUBCUTANEOUS
  Administered 2019-04-28: 17:00:00 7 [IU] via SUBCUTANEOUS
  Administered 2019-04-28: 12:00:00 9 [IU] via SUBCUTANEOUS

## 2019-04-25 MED ORDER — AMLODIPINE BESYLATE 5 MG PO TABS
5.0000 mg | ORAL_TABLET | Freq: Every day | ORAL | Status: DC
  Administered 2019-04-25 – 2019-04-28 (×4): 5 mg via ORAL
  Filled 2019-04-25 (×4): qty 1

## 2019-04-25 MED ORDER — METHYLPREDNISOLONE SODIUM SUCC 40 MG IJ SOLR
40.0000 mg | Freq: Two times a day (BID) | INTRAMUSCULAR | Status: DC
  Administered 2019-04-25 – 2019-04-29 (×8): 40 mg via INTRAVENOUS
  Filled 2019-04-25 (×8): qty 1

## 2019-04-25 MED ORDER — SODIUM CHLORIDE 0.9 % IV SOLN
100.0000 mg | Freq: Every day | INTRAVENOUS | Status: AC
  Administered 2019-04-26 – 2019-04-29 (×4): 100 mg via INTRAVENOUS
  Filled 2019-04-25 (×4): qty 20

## 2019-04-25 MED ORDER — PREDNISOLONE ACETATE 1 % OP SUSP
1.0000 [drp] | Freq: Two times a day (BID) | OPHTHALMIC | Status: DC
  Administered 2019-04-25 – 2019-05-06 (×17): 1 [drp] via OPHTHALMIC
  Filled 2019-04-25: qty 1

## 2019-04-25 MED ORDER — PANTOPRAZOLE SODIUM 40 MG PO TBEC
40.0000 mg | DELAYED_RELEASE_TABLET | Freq: Every day | ORAL | Status: DC
  Administered 2019-04-25 – 2019-05-05 (×10): 40 mg via ORAL
  Filled 2019-04-25 (×11): qty 1

## 2019-04-25 MED ORDER — AMLODIPINE BESYLATE 5 MG PO TABS: 5.0000 mg | ORAL_TABLET | Freq: Every day | ORAL | Status: DC

## 2019-04-25 MED ORDER — ENOXAPARIN SODIUM 40 MG/0.4ML ~~LOC~~ SOLN
40.0000 mg | SUBCUTANEOUS | Status: DC
  Administered 2019-04-25 – 2019-04-28 (×4): 40 mg via SUBCUTANEOUS
  Filled 2019-04-25 (×4): qty 0.4

## 2019-04-25 MED ORDER — ALBUTEROL SULFATE HFA 108 (90 BASE) MCG/ACT IN AERS
INHALATION_SPRAY | RESPIRATORY_TRACT | Status: AC
  Administered 2019-04-25: 14:00:00 8 via RESPIRATORY_TRACT
  Filled 2019-04-25: qty 6.7

## 2019-04-25 MED ORDER — ALBUTEROL SULFATE HFA 108 (90 BASE) MCG/ACT IN AERS: 8.0000 | INHALATION_SPRAY | Freq: Once | RESPIRATORY_TRACT | Status: AC

## 2019-04-25 MED ORDER — ONDANSETRON HCL 4 MG/2ML IJ SOLN
INTRAMUSCULAR | Status: AC
  Filled 2019-04-25: qty 2

## 2019-04-25 MED ORDER — ALBUTEROL SULFATE HFA 108 (90 BASE) MCG/ACT IN AERS
2.0000 | INHALATION_SPRAY | RESPIRATORY_TRACT | Status: DC | PRN
  Filled 2019-04-25: qty 6.7

## 2019-04-25 MED ORDER — DEXAMETHASONE SODIUM PHOSPHATE 10 MG/ML IJ SOLN
6.0000 mg | Freq: Once | INTRAMUSCULAR | Status: AC
  Administered 2019-04-25: 13:00:00 6 mg via INTRAVENOUS
  Filled 2019-04-25: qty 1

## 2019-04-25 MED ORDER — ONDANSETRON HCL 4 MG/2ML IJ SOLN
4.0000 mg | Freq: Once | INTRAMUSCULAR | Status: AC
  Administered 2019-04-25: 4 mg via INTRAVENOUS

## 2019-04-25 MED ORDER — TIMOLOL MALEATE 0.5 % OP SOLN
1.0000 [drp] | Freq: Two times a day (BID) | OPHTHALMIC | Status: DC
  Administered 2019-04-25 – 2019-05-06 (×14): 1 [drp] via OPHTHALMIC
  Filled 2019-04-25: qty 5

## 2019-04-25 NOTE — ED Notes (Signed)
Pt spoke on the phone with daughter briefly after I called and updated daughter and also sent a text with room number and phone number at [facility]

## 2019-04-25 NOTE — ED Notes (Signed)
Pt spo2 86-88% on 6L [place] (with humidification) and pt reported "I do not feel good"  Increased O2 to 7L [place]

## 2019-04-25 NOTE — H&P (Signed)
HISTORY AND PHYSICAL       PATIENT DETAILS Name: Mary Trevino Age: 86 y.o. Sex: female Date of Birth: [birth year] Admit Date: 05/03/2019 PCP:O'Sullivan, Lenna Sciara, NP   Patient coming from: [place]:  Shortness of breath  HPI: Mary Trevino is a 86 y.o. female with medical history significant of HTN, HLD, glaucoma, legal blindness, history of corneal transplantation who presented as a transfer from [place] for evaluation of severe hypoxemia secondary to COVID-19 pneumonia.  Per patient-approximately 6 days also-she started having sore throat, dry cough and fatigue.  She was tested for COVID-19 on 1/5 and was isolating at home.  For the past 3-4 days-she claims she developed dyspnea mostly on exertion.  She also had subjective fever and myalgias.  She denies any nausea, vomiting or diarrhea.  She denies any abdominal pain.  Family elected to call EMS, she was found to be hypoxic and transferred to [facility] where chest x-ray confirmed pneumonia-and she was hypoxic requiring NRB.  She was then transferred to [facility] for further management and treatment.   Note: Lives at: Home Mobility:  [place] Chronic Indwelling Foley:no   REVIEW OF SYSTEMS:  Constitutional:   No  weight loss, night sweats  HEENT:    No headaches, Dysphagia,Tooth/dental problems  Cardio-vascular: No chest pain,Orthopnea, PND,lower extremity edema, anasarca, palpitations  GI:  No heartburn, indigestion, abdominal pain, nausea, vomiting, diarrhea, melena or hematochezia  Resp: No  hemoptysis,plueritic chest pain.   Skin:  No rash or lesions.  GU:  No dysuria, change in color of urine, no urgency or frequency.  No flank pain.  Musculoskeletal: No joint pain or swelling.  No decreased range of motion.  No back pain.  Endocrine: No heat intolerance, no cold intolerance, no polyuria, no polydipsia  Psych: No change in  mood or affect. No depression or anxiety.  No memory loss.   ALLERGIES:   Allergies  Allergen Reactions  . Augmentin [Amoxicillin-Pot Clavulanate] Diarrhea    PAST MEDICAL HISTORY: Past Medical History:  Diagnosis Date  . Glaucoma   . Headache   . Hyperlipidemia   . Hypertension   . Legally blind   . Subarachnoid hemorrhage ([place]) 12/2015    PAST SURGICAL HISTORY: Past Surgical History:  Procedure Laterality Date  . ABDOMINAL HYSTERECTOMY  1975  . CORNEAL TRANSPLANT     2016 (left) 2014 (right)     MEDICATIONS AT HOME: Prior to Admission medications   Medication Sig Start Date End Date Taking? Authorizing Provider  acetaminophen (TYLENOL) 325 MG tablet Take 650 mg by mouth every 4 (four) hours as needed. 12/20/15  Yes [provider]  amLODipine (NORVASC) 5 MG tablet Take 1 tablet (5 mg total) by mouth daily. 03/29/19  Yes Debbrah Alar, NP  atorvastatin (LIPITOR) 10 MG tablet TAKE 1 TABLET DAILY 04/02/18  Yes Debbrah Alar, NP  benzonatate (TESSALON) 100 MG capsule Take 1 capsule (100 mg total) by mouth every 8 (eight) hours. 04/17/19  Yes Schlossman, Junie Panning, MD  brimonidine-timolol (COMBIGAN) 0.2-0.5 % ophthalmic solution Place 1 drop into both eyes twice a day   Yes [provider]  clindamycin (CLEOCIN) 300 MG capsule Take 1 capsule (300 mg total) by mouth 3 (three) times daily. 12/28/18  Yes Shelda Pal, DO  escitalopram (LEXAPRO) 10 MG tablet TAKE 1 TABLET DAILY 03/20/18  Yes Debbrah Alar, NP  furosemide (LASIX) 20 MG tablet Take 1  tablet (20 mg total) by mouth daily as needed for edema. 06/13/18  Yes Debbrah Alar, NP  loteprednol (LOTEMAX) 0.5 % ophthalmic suspension Place 1 drop into the right eye.  09/24/15  Yes [provider]  methylPREDNISolone (MEDROL DOSEPAK) 4 MG TBPK tablet Please take per package directions 06/13/18  Yes Debbrah Alar, NP  metoprolol succinate (TOPROL XL) 25 MG 24 hr tablet Take 1  tablet (25 mg total) by mouth daily. 04/03/19  Yes Debbrah Alar, NP  nortriptyline (PAMELOR) 10 MG capsule Take 2 capsules (20 mg total) by mouth at bedtime. 12/05/17  Yes Marcial Pacas, MD  pantoprazole (PROTONIX) 40 MG tablet TAKE 1 TABLET DAILY 02/18/18  Yes Debbrah Alar, NP  prednisoLONE acetate (PRED FORTE) 1 % ophthalmic suspension Place 1 drop into the left eye twice a day   Yes [provider]    FAMILY HISTORY: Family History  Problem Relation Age of Onset  . Hypertension Mother        died of cardiac arrest age 84  . Heart attack Mother   . Other Father        natural causes  . Colon cancer Daughter        died age 37      SOCIAL HISTORY:  reports that she has quit smoking. She quit after 10.00 years of use. She has never used smokeless tobacco. She reports that she does not drink alcohol or use drugs.  PHYSICAL EXAM: Blood pressure 136/75, pulse 96, temperature 99 F (37.2 C), temperature source Axillary, resp. rate (!) 26, height 5\' 5"  (1.651 m), weight 61.3 kg, SpO2 95 %.  General appearance :Awake, alert, not in any distress.  HEENT: Atraumatic and Normocephalic Neck: supple, no JVD.  Resp:Good air entry bilaterally, no added sounds heard anteriorly-but fine bibasilar rales posteriorly. CVS: S1 S2 regular, no murmurs.  GI: Bowel sounds present, Non tender and not distended with no gaurding, rigidity or rebound. Extremities: B/L Lower Ext shows no edema, both legs are warm to touch Neurology:  speech clear,Non focal, sensation is grossly intact. Psychiatric: Normal judgment and insight. Alert and oriented x 3. Normal mood. Musculoskeletal:gait appears to be normal.No digital cyanosis Skin:No Rash, warm and dry Wounds:N/A  LABS ON ADMISSION:  I have personally reviewed following labs and imaging studies  CBC: Recent Labs  Lab 04/27/2019 1318  WBC 11.0*  NEUTROABS 9.5*  HGB 14.7  HCT 45.0  MCV 84.9  PLT 123XX123    Basic Metabolic  Panel: Recent Labs  Lab 04/27/2019 1318  NA 140  K 4.2  CL 101  CO2 28  GLUCOSE 157*  BUN 21  CREATININE 0.99  CALCIUM 9.9    GFR: Estimated Creatinine Clearance: 34 mL/min (by C-G formula based on SCr of 0.99 mg/dL).  Liver Function Tests: Recent Labs  Lab 04/24/2019 1318  AST 55*  ALT 52*  ALKPHOS 38  BILITOT 0.5  PROT 7.4  ALBUMIN 3.9   No results for input(s): LIPASE, AMYLASE in the last 168 hours. No results for input(s): AMMONIA in the last 168 hours.  Coagulation Profile: No results for input(s): INR, PROTIME in the last 168 hours.  Cardiac Enzymes: No results for input(s): CKTOTAL, CKMB, CKMBINDEX, TROPONINI in the last 168 hours.  BNP (last 3 results) No results for input(s): PROBNP in the last 8760 hours.  HbA1C: No results for input(s): HGBA1C in the last 72 hours.  CBG: No results for input(s): GLUCAP in the last 168 hours.  Lipid Profile: Recent Labs  05/10/2019 1318  TRIG 128    Thyroid Function Tests: No results for input(s): TSH, T4TOTAL, FREET4, T3FREE, THYROIDAB in the last 72 hours.  Anemia Panel: Recent Labs    05/04/2019 1318  FERRITIN 582*    Urine analysis:    Component Value Date/Time   COLORURINE YELLOW 04/17/2019 1540   APPEARANCEUR CLOUDY (A) 04/17/2019 1540   LABSPEC 1.015 04/17/2019 1540   PHURINE 6.5 04/17/2019 1540   GLUCOSEU NEGATIVE 04/17/2019 1540   HGBUR TRACE (A) 04/17/2019 1540   BILIRUBINUR NEGATIVE 04/17/2019 1540   BILIRUBINUR Negative 09/07/2017 1126   KETONESUR NEGATIVE 04/17/2019 1540   PROTEINUR NEGATIVE 04/17/2019 1540   UROBILINOGEN 0.2 09/07/2017 1126   NITRITE POSITIVE (A) 04/17/2019 1540   LEUKOCYTESUR TRACE (A) 04/17/2019 1540    Sepsis Labs: Lactic Acid, Venous    Component Value Date/Time   LATICACIDVEN 1.6 05/11/2019 1318     Microbiology: Recent Results (from the past 240 hour(s))  Urine culture     Status: Abnormal   Collection Time: 04/17/19  3:40 PM   Specimen: Urine, Random   Result Value Ref Range Status   Specimen Description   Final    URINE, RANDOM Performed at [facility], [address] [place], [place] [address]    Special Requests   Final    NONE Performed at [facility], [address] [place], [place] [address]    Culture >=100,000 COLONIES/mL ESCHERICHIA COLI (A)  Final   Report Status 04/19/2019 FINAL  Final   Organism ID, Bacteria ESCHERICHIA COLI (A)  Final      Susceptibility   Escherichia coli - MIC*    AMPICILLIN 4 SENSITIVE Sensitive     CEFAZOLIN <=4 SENSITIVE Sensitive     CEFTRIAXONE <=0.25 SENSITIVE Sensitive     CIPROFLOXACIN <=0.25 SENSITIVE Sensitive     GENTAMICIN <=1 SENSITIVE Sensitive     IMIPENEM <=0.25 SENSITIVE Sensitive     NITROFURANTOIN <=16 SENSITIVE Sensitive     TRIMETH/SULFA <=20 SENSITIVE Sensitive     AMPICILLIN/SULBACTAM <=2 SENSITIVE Sensitive     PIP/TAZO <=4 SENSITIVE Sensitive     * >=100,000 COLONIES/mL ESCHERICHIA COLI  SARS Coronavirus 2 Ag (30 min TAT) - Urine, Clean Catch     Status: Abnormal   Collection Time: 04/17/19  3:41 PM   Specimen: Urine, Clean Catch; Nasal Swab  Result Value Ref Range Status   SARS Coronavirus 2 Ag POSITIVE (A) NEGATIVE Final    Comment: CRITICAL RESULT CALLED TO, READ BACK BY AND VERIFIED WITH: CINDY R. RN AT 1419 ON 04/17/19 BY [email or web address] (NOTE) SARS-CoV-2 antigen PRESENT. Positive results indicate the presence of viral antigens, but clinical correlation with patient history and other diagnostic information is necessary to determine patient infection status.  Positive results do not rule out bacterial infection or co-infection  with other viruses. False positive results are rare but can occur, and confirmatory RT-PCR testing may be appropriate in some circumstances. The expected result is Negative. Fact Sheet for Patients: PodPark.tn Fact Sheet for Providers: GiftContent.is  This test is  not yet approved or cleared by the [place] FDA and  has been authorized for detection and/or diagnosis of SARS-CoV-2 by FDA under an Emergency Use Authorization (EUA).  This EUA will remain in effect (meaning this test can be used) for the duration of  the [place] VID-19 declaration under Section 564(b)(1) of the Act, 21 U.S.C. section 360bbb-3(b)(1), unless the authorization is terminated or revoked sooner. Performed at [facility], [address] [place], [place] [address]       RADIOLOGIC STUDIES ON ADMISSION: DG Chest Port 1 View  Result Date: 04/28/2019 CLINICAL DATA:  Brought in by EMS from home for SOB , COVID+, cough x 2 days EXAM: PORTABLE CHEST 1 VIEW COMPARISON:  04/17/2019 FINDINGS: There hazy airspace lung opacities predominantly in the mid to lower lungs, new or increased when compared to the prior exam. No convincing pleural effusion.  No pneumothorax. Cardiac silhouette is normal in size. No mediastinal or hilar masses. Skeletal structures are grossly intact. IMPRESSION: 1. Bilateral hazy airspace lung opacities have developed since prior study consistent multifocal pneumonia due to COVID-19 infection. Electronically Signed   By: Lajean Manes M.D.   On: 05/05/2019 13:39    I have personally reviewed images of chest xray-bilateral infiltrates.  EKG:  Personally reviewed.  Normal sinus rhythm.  ASSESSMENT AND PLAN: Acute hypoxic respiratory failure secondary to COVID-19 pneumonia: Has severe hypoxia-requiring approximately 13-15 L of HFNC.  Will continue remdesivir-and add Solu-Medrol.  Spoke with patient regarding the off label use of Actemra-she has no history of tuberculosis/ hepatitis B/diverticultis-risks/benefits/rationale was discussed-I subsequently spoke with the patient's daughter over the phone regarding off label use of Actemra-both patient and daughter have consented.    The rationale for the off label use of Actemra its known side effects,  potential benefits was  discussed with patient .The use of Actemra is based on published clinical articles/anecdotal data as several randomized trials have been negative, but lately there have been a few positive randomized trials as well.  There is no history of tuberculosis, hepatitis B or C.  Complete risks and long-term side effects are unknown, however in the best clinical judgment it is felt that the clinical benefit at this time outweighs medical risks given tenuous clinical state of the patient.  Patient and daughter agrees with the treatment plan and consent to the use of Actemra.    COVID-19 Labs  Recent Labs    05/13/2019 1318  DDIMER 1.89*  FERRITIN 582*  LDH 380*  CRP 32.8*    No results found for: SARSCOV2NAA  COVID-19 medications: Steroids: 1/13>> Remdesivir: 1/13>> Actemra: 1/13 x 1  Other medications: Lasix: Euvolemic-not indicated but will try maintain negative balance. Antibiotics: Doubt bacterial infection-received Rocephin/Zithromax x1 in the emergency room Insulin: Monitor CBGs closely while on steroids  HTN: Controlled-continue amlodipine and metoprolol  HLD: Continue statin  History of glaucoma/corneal transplantation/legal blindness: Resume eyedrops.  Goals of care: Severity of illness with severe hypoxemia discussed with patient-risk of further deterioration discussed-if she deteriorates-she does not want to be resuscitated-does not want to be placed on the ventilator-DNR ordered (2 nurses at bedside).  Tried calling daughter x3-no response-have left a voicemail.  Addendum: Spoke with daughter-understands the severity of patient's illness and the potential to deteriorate.  See above regarding Actemra.  Discussed CODE STATUS-discussed with patient's wishes were-she is agreeable with continuing DNR.  Further plan will depend as patient's clinical course evolves and further radiologic and laboratory data become available. Patient will be monitored  closely.  CONSULTS: None  DVT Prophylaxis: Prophylactic Lovenox   Code Status: DNR  Disposition Plan:  Discharge back home vs SNF possibly in 4-5 days, depending on clinical course  Admission status: Inpatient going toSDU  The patient is critically ill with multiple organ system failure and requires high complexity decision making for assessment and support, frequent evaluation and titration of therapies, advanced monitoring, review of radiographic studies  and interpretation of complex data.   Total time spent  55 minutes.Greater than 50% of this time was spent in counseling, explanation of diagnosis, planning of further management, and coordination of care.  Severity of illness: The appropriate patient status for this patient is INPATIENT. Inpatient status is judged to be reasonable and necessary in order to provide the required intensity of service to ensure the patient's safety. The patient's presenting symptoms, physical exam findings, and initial radiographic and laboratory data in the context of their chronic comorbidities is felt to place them at high risk for further clinical deterioration. Furthermore, it is not anticipated that the patient will be medically stable for discharge from the hospital within 2 midnights of admission. The following factors support the patient status of inpatient.   " The patient's presenting symptoms include shortness of breath, fatigue " The worrisome physical exam findings include severe hypoxia " The initial radiographic and laboratory data are worrisome because of COVID-19 with x-ray evidence of pneumonia. " The chronic co-morbidities include HTN, HLD, glaucoma  * I certify that at the point of admission it is my clinical judgment that the patient will require inpatient hospital care spanning beyond 2 midnights from the point of admission due to high intensity of service, high risk for further deterioration and high frequency of surveillance  required.  Oren Binet [facility] Pager [phone]  If 7PM-7AM, please contact night-coverage  Please page via [email or web address]  Go to [email or web address] and use [facility] universal password to access. If you do not have the password, please contact the hospital operator.  Locate the [facility] provider you are looking for under [facility] and page to a number that you can be directly reached. If you still have difficulty reaching the provider, please page the [facility] (Director on Call) for the Hospitalists listed on amion for assistance.  04/18/2019, 5:13 PM

## 2019-04-25 NOTE — Plan of Care (Signed)

## 2019-04-25 NOTE — ED Notes (Signed)
Pt continues to report that she is feeling badly.  She reports that she is still nauseated and feel cold and feels "bad".  Warm blankets placed on pt for comfort.  Gave pt some sips of ice water per pt request.  RN remains at the bedside.

## 2019-04-25 NOTE — ED Provider Notes (Signed)
[facility] EMERGENCY DEPARTMENT Provider Note   CSN: [id number] Arrival date & time: 04/24/2019  1303     History Chief Complaint  Patient presents with  . Shortness of Breath    Mary Trevino is a 86 y.o. female.  She was diagnosed with Covid about a week ago.  She was seen here then and also diagnosed with urinary tract infection.  She did not require admission at that time.  Level 5 caveat secondary to respiratory distress.  Per EMS she has been having progressively worsening respiratory symptoms over the past few days.  Cough and shortness of breath.  She also complains of body aches and headache.  She had some chills today and family elected to call EMS.  The history is provided by the patient and the EMS personnel.  Shortness of Breath Severity:  Moderate Onset quality:  Gradual Timing:  Constant Progression:  Worsening Chronicity:  New Relieved by:  Nothing Worsened by:  Exertion and coughing Ineffective treatments:  None tried Associated symptoms: cough and headaches   Associated symptoms: no abdominal pain, no chest pain, no fever, no rash, no sore throat and no vomiting        Past Medical History:  Diagnosis Date  . Glaucoma   . Headache   . Hyperlipidemia   . Hypertension   . Legally blind   . Subarachnoid hemorrhage ([place]) 12/2015    Patient Active Problem List   Diagnosis Date Noted  . Chronic nonintractable headache 12/05/2017  . Spinal stenosis in cervical region 12/03/2016  . Myofascial pain dysfunction syndrome 12/03/2016  . Overactive bladder 09/21/2016  . History of corneal transplant 08/19/2016  . Depression 06/21/2016  . Cervico-occipital neuralgia of the right side 04/22/2016  . Right lumbar radiculopathy 04/01/2016  . Headache 04/01/2016  . History of subarachnoid hemorrhage 12/23/2015  . Legally blind 11/12/2015  . HTN (hypertension) 09/10/2015  . Glaucoma 09/10/2015  . Hyperlipidemia 09/10/2015  . Atypical chest pain 09/10/2015     Past Surgical History:  Procedure Laterality Date  . ABDOMINAL HYSTERECTOMY  1975  . CORNEAL TRANSPLANT     2016 (left) 2014 (right)      OB History   No obstetric history on file.     Family History  Problem Relation Age of Onset  . Hypertension Mother        died of cardiac arrest age 22  . Heart attack Mother   . Other Father        natural causes  . Colon cancer Daughter        died age 41    Social History   Tobacco Use  . Smoking status: Former Smoker    Years: 10.00  . Smokeless tobacco: Never Used  Substance Use Topics  . Alcohol use: No    Alcohol/week: 0.0 standard drinks  . Drug use: No    Home Medications Prior to Admission medications   Medication Sig Start Date End Date Taking? Authorizing Provider  acetaminophen (TYLENOL) 325 MG tablet Take 650 mg by mouth every 4 (four) hours as needed. 12/20/15   [provider]  amLODipine (NORVASC) 5 MG tablet Take 1 tablet (5 mg total) by mouth daily. 03/29/19   Debbrah Alar, NP  atorvastatin (LIPITOR) 10 MG tablet TAKE 1 TABLET DAILY 04/02/18   Debbrah Alar, NP  benzonatate (TESSALON) 100 MG capsule Take 1 capsule (100 mg total) by mouth every 8 (eight) hours. 04/17/19   Gareth Morgan, MD  brimonidine-timolol (COMBIGAN) 0.2-0.5 %  ophthalmic solution Place 1 drop into both eyes twice a day    [provider]  clindamycin (CLEOCIN) 300 MG capsule Take 1 capsule (300 mg total) by mouth 3 (three) times daily. 12/28/18   Shelda Pal, DO  escitalopram (LEXAPRO) 10 MG tablet TAKE 1 TABLET DAILY 03/20/18   Debbrah Alar, NP  furosemide (LASIX) 20 MG tablet Take 1 tablet (20 mg total) by mouth daily as needed for edema. 06/13/18   Debbrah Alar, NP  loteprednol (LOTEMAX) 0.5 % ophthalmic suspension Place 1 drop into the right eye.  09/24/15   [provider]  methylPREDNISolone (MEDROL DOSEPAK) 4 MG TBPK tablet Please take per package directions 06/13/18    Debbrah Alar, NP  metoprolol succinate (TOPROL XL) 25 MG 24 hr tablet Take 1 tablet (25 mg total) by mouth daily. 04/03/19   Debbrah Alar, NP  nortriptyline (PAMELOR) 10 MG capsule Take 2 capsules (20 mg total) by mouth at bedtime. 12/05/17   Marcial Pacas, MD  pantoprazole (PROTONIX) 40 MG tablet TAKE 1 TABLET DAILY 02/18/18   Debbrah Alar, NP  prednisoLONE acetate (PRED FORTE) 1 % ophthalmic suspension Place 1 drop into the left eye twice a day    [provider]    Allergies    Augmentin [amoxicillin-pot clavulanate]  Review of Systems   Review of Systems  Constitutional: Positive for chills. Negative for fever.  HENT: Negative for sore throat.   Eyes: Negative for visual disturbance.  Respiratory: Positive for cough and shortness of breath.   Cardiovascular: Negative for chest pain.  Gastrointestinal: Negative for abdominal pain and vomiting.  Genitourinary: Negative for dysuria.  Musculoskeletal: Positive for back pain and myalgias.  Skin: Negative for rash.  Neurological: Positive for headaches.    Physical Exam Updated Vital Signs BP 131/70   Pulse (!) 103   Temp 99.3 F (37.4 C)   Resp (!) 32   Ht 5\' 4"  (1.626 m)   Wt 63 kg   SpO2 100%   BMI 23.84 kg/m   Physical Exam Vitals and nursing note reviewed.  Constitutional:      General: She is not in acute distress.    Appearance: She is well-developed.  HENT:     Head: Normocephalic and atraumatic.  Eyes:     Conjunctiva/sclera: Conjunctivae normal.  Cardiovascular:     Rate and Rhythm: Regular rhythm. Tachycardia present.     Heart sounds: No murmur.  Pulmonary:     Effort: Tachypnea and accessory muscle usage present. No respiratory distress.     Breath sounds: No stridor. Rhonchi (scattered) present. No wheezing.  Abdominal:     Palpations: Abdomen is soft.     Tenderness: There is no abdominal tenderness. There is no guarding or rebound.  Musculoskeletal:        General: No  tenderness. Normal range of motion.     Cervical back: Neck supple.     Right lower leg: No tenderness. No edema.     Left lower leg: No tenderness. No edema.  Skin:    General: Skin is warm and dry.     Capillary Refill: Capillary refill takes less than 2 seconds.  Neurological:     General: No focal deficit present.     Mental Status: She is alert.     GCS: GCS eye subscore is 4. GCS verbal subscore is 5. GCS motor subscore is 6.     ED Results / Procedures / Treatments   Labs (all labs ordered are  listed, but only abnormal results are displayed) Labs Reviewed  CBC WITH DIFFERENTIAL/PLATELET - Abnormal; Notable for the following components:      Result Value   WBC 11.0 (*)    RBC 5.30 (*)    Neutro Abs 9.5 (*)    Abs Immature Granulocytes 0.13 (*)    All other components within normal limits  COMPREHENSIVE METABOLIC PANEL - Abnormal; Notable for the following components:   Glucose, Bld 157 (*)    AST 55 (*)    ALT 52 (*)    GFR calc non Af Amer 50 (*)    GFR calc Af Amer 58 (*)    All other components within normal limits  D-DIMER, QUANTITATIVE (NOT AT [facility]) - Abnormal; Notable for the following components:   D-Dimer, Quant 1.89 (*)    All other components within normal limits  LACTATE DEHYDROGENASE - Abnormal; Notable for the following components:   LDH 380 (*)    All other components within normal limits  FERRITIN - Abnormal; Notable for the following components:   Ferritin 582 (*)    All other components within normal limits  FIBRINOGEN - Abnormal; Notable for the following components:   Fibrinogen >800 (*)    All other components within normal limits  C-REACTIVE PROTEIN - Abnormal; Notable for the following components:   CRP 32.8 (*)    All other components within normal limits  CULTURE, BLOOD (ROUTINE X 2)  CULTURE, BLOOD (ROUTINE X 2)  URINE CULTURE  LACTIC ACID, PLASMA  PROCALCITONIN  TRIGLYCERIDES  HEMOGLOBIN A1C  CBC  CREATININE, SERUM  CBC WITH  DIFFERENTIAL/PLATELET  COMPREHENSIVE METABOLIC PANEL  C-REACTIVE PROTEIN  FERRITIN  ABO/RH  TYPE AND SCREEN    EKG EKG Interpretation  Date/Time:  Wednesday April 25 2019 13:11:10 EST Ventricular Rate:  102 PR Interval:    QRS Duration: 71 QT Interval:  305 QTC Calculation: 398 R Axis:   -70 Text Interpretation: Sinus tachycardia Abnormal R-wave progression, early transition Inferior infarct, old Lateral leads are also involved nonspecific t wave changes form prior 12/18 Confirmed by Aletta Edouard [phone]) on 05/10/2019 1:51:35 PM   Radiology DG Chest Port 1 View  Result Date: 04/21/2019 CLINICAL DATA:  Brought in by EMS from home for SOB , COVID+, cough x 2 days EXAM: PORTABLE CHEST 1 VIEW COMPARISON:  04/17/2019 FINDINGS: There hazy airspace lung opacities predominantly in the mid to lower lungs, new or increased when compared to the prior exam. No convincing pleural effusion.  No pneumothorax. Cardiac silhouette is normal in size. No mediastinal or hilar masses. Skeletal structures are grossly intact. IMPRESSION: 1. Bilateral hazy airspace lung opacities have developed since prior study consistent multifocal pneumonia due to COVID-19 infection. Electronically Signed   By: Lajean Manes M.D.   On: 04/22/2019 13:39    Procedures .Critical Care Performed by: Hayden Rasmussen, MD Authorized by: Hayden Rasmussen, MD   Critical care provider statement:    Critical care time (minutes):  45   Critical care time was exclusive of:  Separately billable procedures and treating other patients   Critical care was necessary to treat or prevent imminent or life-threatening deterioration of the following conditions:  Respiratory failure   Critical care was time spent personally by me on the following activities:  Discussions with consultants, evaluation of patient's response to treatment, examination of patient, ordering and performing treatments and interventions, ordering and review of  laboratory studies, ordering and review of radiographic studies, pulse oximetry, re-evaluation of patient's  condition, obtaining history from patient or surrogate, review of old charts and development of treatment plan with patient or surrogate   I assumed direction of critical care for this patient from another provider in my specialty: no     (including critical care time)  Medications Ordered in ED Medications  remdesivir 100 mg in sodium chloride 0.9 % 100 mL IVPB (has no administration in time range)  amLODipine (NORVASC) tablet 5 mg (has no administration in time range)  furosemide (LASIX) tablet 20 mg (has no administration in time range)  atorvastatin (LIPITOR) tablet 10 mg (has no administration in time range)  metoprolol succinate (TOPROL-XL) 24 hr tablet 25 mg (has no administration in time range)  escitalopram (LEXAPRO) tablet 10 mg (has no administration in time range)  nortriptyline (PAMELOR) capsule 20 mg (has no administration in time range)  pantoprazole (PROTONIX) EC tablet 40 mg (has no administration in time range)  prednisoLONE acetate (PRED FORTE) 1 % ophthalmic suspension 1 drop (has no administration in time range)  brimonidine-timolol (COMBIGAN) 0.2-0.5 % ophthalmic solution 1 drop (has no administration in time range)  loteprednol (LOTEMAX) 0.5 % ophthalmic suspension 1 drop (has no administration in time range)  insulin aspart (novoLOG) injection 0-9 Units (has no administration in time range)  enoxaparin (LOVENOX) injection 40 mg (has no administration in time range)  sodium chloride flush (NS) 0.9 % injection 3 mL (has no administration in time range)  albuterol (VENTOLIN HFA) 108 (90 Base) MCG/ACT inhaler 2 puff (has no administration in time range)  guaiFENesin-dextromethorphan (ROBITUSSIN DM) 100-10 MG/5ML syrup 10 mL (has no administration in time range)  chlorpheniramine-HYDROcodone (TUSSIONEX) 10-8 MG/5ML suspension 5 mL (has no administration in time range)    methylPREDNISolone sodium succinate (SOLU-MEDROL) 40 mg/mL injection 40 mg (has no administration in time range)  acetaminophen (TYLENOL) tablet 650 mg (has no administration in time range)  ondansetron (ZOFRAN) tablet 4 mg (has no administration in time range)    Or  ondansetron (ZOFRAN) injection 4 mg (has no administration in time range)  polyethylene glycol (MIRALAX / GLYCOLAX) packet 17 g (has no administration in time range)  ascorbic acid (VITAMIN C) tablet 500 mg (has no administration in time range)  zinc sulfate capsule 220 mg (has no administration in time range)  dexamethasone (DECADRON) injection 6 mg (6 mg Intravenous Given 05/11/2019 1326)  albuterol (VENTOLIN HFA) 108 (90 Base) MCG/ACT inhaler 8 puff (8 puffs Inhalation Given 05/09/2019 1339)  cefTRIAXone (ROCEPHIN) 1 g in sodium chloride 0.9 % 100 mL IVPB (0 g Intravenous Stopped 04/19/2019 1450)  azithromycin (ZITHROMAX) 500 mg in sodium chloride 0.9 % 250 mL IVPB (0 mg Intravenous Stopped 04/18/2019 1514)  ondansetron (ZOFRAN) injection 4 mg (4 mg Intravenous Given 04/17/2019 1423)  0.9 %  sodium chloride infusion ( Intravenous New Bag/Given 04/14/2019 1448)  dexamethasone (DECADRON) injection 6 mg (6 mg Intravenous Given 04/22/2019 1512)  remdesivir 100 mg in sodium chloride 0.9 % 100 mL IVPB (100 mg Intravenous New Bag/Given 05/03/2019 1544)    ED Course  I have reviewed the triage vital signs and the nursing notes.  Pertinent labs & imaging results that were available during my care of the patient were reviewed by me and considered in my medical decision making (see chart for details).  Clinical Course as of Apr 24 1717  Wed Apr 25, 2019  1314 Known Covid positive 86 year old with worsening shortness of breath chills body aches.  EMS said sats were in the 70s although she has nail  polish on so not sure how reliable that is.  She is currently on a nonrebreather.  Differential includes Covid pneumonia, bacterial pneumonia, pneumothorax,  metabolic derangement, urosepsis   [MB]  1327 Urine culture from earlier this month grew out greater than 100,000 E. coli pansensitive.  Was discharged on Keflex.   [MB]  [id number] Chest x-ray interpreted by me as multifocal pneumonia.   [MB]  [id number] Respiratory was able to wean her down to 6 L.  She desats with any movement.   [MB]  1403 I called the patient's daughter Caryn Section.  She said she has been isolating but living at the same place as her mom.  She said she heard her coughing worse today and so called the ambulance for her.  We reviewed CODE STATUS and she wants her to be a full code.   [MB]  [id number] Discussed with [place] [facility] hospitalist [place].  He is excepting the patient for admission to progressive unit.  Recommends starting remdesivir and increasing Decadron dose to a total of 12 mg.  Also recommended starting Actemra although apparently not available on this campus currently.   [MB]    Clinical Course User Index [MB] Hayden Rasmussen, MD   MDM Rules/Calculators/A&P                     Janecia Pura was evaluated in Emergency Department on 05/10/2019 for the symptoms described in the history of present illness. She was evaluated in the context of the global COVID-19 pandemic, which necessitated consideration that the patient might be at risk for infection with the SARS-CoV-2 virus that causes COVID-19. Institutional protocols and algorithms that pertain to the evaluation of patients at risk for COVID-19 are in a state of rapid change based on information released by regulatory bodies including the CDC and federal and state organizations. These policies and algorithms were followed during the patient's care in the ED.  Final Clinical Impression(s) / ED Diagnoses Final diagnoses:  Acute respiratory failure with hypoxia ([place])  Pneumonia due to COVID-19 virus    Rx / DC Orders ED Discharge Orders    None       Hayden Rasmussen, MD 04/18/2019 1720

## 2019-04-25 NOTE — Progress Notes (Signed)
On call provider notified r/t patient having 3 large, liquid stools at beginning of shift. Specimen obtained. Staff makes inquiry related if MD would like to run a C. Diff on collected specimen.

## 2019-04-25 NOTE — ED Triage Notes (Signed)
Brought in by EMS from home for SOB , COvid Pos, cough x 2 days

## 2019-04-25 NOTE — ED Notes (Signed)
Pt changed into gown, peri care done and placed pure wick on pt with pt permission.  Repositioned pt for comfort and placed multiple warm blankets back on pt for comfort.

## 2019-04-25 NOTE — Progress Notes (Signed)
MD at beside discussing patient condition and potential outcomes. Advance directives discussed and patient stated she would like to be a DNR.

## 2019-04-25 NOTE — ED Notes (Signed)
Pt back to 86% on 7L and also on 8L [place].  RT notified and pt placed back on 15L NRB

## 2019-04-26 DIAGNOSIS — H548 Legal blindness, as defined in USA: Secondary | ICD-10-CM

## 2019-04-26 DIAGNOSIS — H409 Unspecified glaucoma: Secondary | ICD-10-CM

## 2019-04-26 LAB — COMPREHENSIVE METABOLIC PANEL
ALT: 78 U/L — ABNORMAL HIGH (ref 0–44)
AST: 73 U/L — ABNORMAL HIGH (ref 15–41)
Albumin: 2.7 g/dL — ABNORMAL LOW (ref 3.5–5.0)
Alkaline Phosphatase: 35 U/L — ABNORMAL LOW (ref 38–126)
Anion gap: 12 (ref 5–15)
BUN: 27 mg/dL — ABNORMAL HIGH (ref 8–23)
CO2: 22 mmol/L (ref 22–32)
Calcium: 8.6 mg/dL — ABNORMAL LOW (ref 8.9–10.3)
Chloride: 105 mmol/L (ref 98–111)
Creatinine, Ser: 1.02 mg/dL — ABNORMAL HIGH (ref 0.44–1.00)
GFR calc Af Amer: 56 mL/min — ABNORMAL LOW (ref >60)
GFR calc non Af Amer: 48 mL/min — ABNORMAL LOW (ref >60)
Glucose, Bld: 187 mg/dL — ABNORMAL HIGH (ref 70–99)
Potassium: 3.9 mmol/L (ref 3.5–5.1)
Sodium: 139 mmol/L (ref 135–145)
Total Bilirubin: 0.5 mg/dL (ref 0.3–1.2)
Total Protein: 7 g/dL (ref 6.5–8.1)

## 2019-04-26 LAB — GLUCOSE, CAPILLARY
Glucose-Capillary: 158 mg/dL — ABNORMAL HIGH (ref 70–99)
Glucose-Capillary: 179 mg/dL — ABNORMAL HIGH (ref 70–99)
Glucose-Capillary: 217 mg/dL — ABNORMAL HIGH (ref 70–99)

## 2019-04-26 LAB — C DIFFICILE QUICK SCREEN W PCR REFLEX
C Diff antigen: NEGATIVE
C Diff interpretation: NOT DETECTED
C Diff toxin: NEGATIVE

## 2019-04-26 LAB — CBC WITH DIFFERENTIAL/PLATELET
Abs Immature Granulocytes: 0.18 10*3/uL — ABNORMAL HIGH (ref 0.00–0.07)
Basophils Absolute: 0 10*3/uL (ref 0.0–0.1)
Basophils Relative: 0 %
Eosinophils Absolute: 0 10*3/uL (ref 0.0–0.5)
Eosinophils Relative: 0 %
HCT: 40.8 % (ref 36.0–46.0)
Hemoglobin: 13.6 g/dL (ref 12.0–15.0)
Immature Granulocytes: 2 %
Lymphocytes Relative: 8 %
Lymphs Abs: 0.8 10*3/uL (ref 0.7–4.0)
MCH: 28 pg (ref 26.0–34.0)
MCHC: 33.3 g/dL (ref 30.0–36.0)
MCV: 84.1 fL (ref 80.0–100.0)
Monocytes Absolute: 0.3 10*3/uL (ref 0.1–1.0)
Monocytes Relative: 3 %
Neutro Abs: 8.1 10*3/uL — ABNORMAL HIGH (ref 1.7–7.7)
Neutrophils Relative %: 87 %
Platelets: 267 10*3/uL (ref 150–400)
RBC: 4.85 MIL/uL (ref 3.87–5.11)
RDW: 14.4 % (ref 11.5–15.5)
WBC: 9.3 10*3/uL (ref 4.0–10.5)
nRBC: 0 % (ref 0.0–0.2)

## 2019-04-26 LAB — C-REACTIVE PROTEIN: CRP: 28.9 mg/dL — ABNORMAL HIGH (ref <1.0)

## 2019-04-26 LAB — FERRITIN: Ferritin: 601 ng/mL — ABNORMAL HIGH (ref 11–307)

## 2019-04-26 LAB — ABO/RH: ABO/RH(D): O POS

## 2019-04-26 LAB — HEMOGLOBIN A1C
Hgb A1c MFr Bld: 6.1 % — ABNORMAL HIGH (ref 4.8–5.6)
Mean Plasma Glucose: 128.37 mg/dL

## 2019-04-26 MED ORDER — LOPERAMIDE HCL 2 MG PO CAPS
2.0000 mg | ORAL_CAPSULE | ORAL | Status: DC | PRN
  Administered 2019-04-27: 2 mg via ORAL
  Filled 2019-04-26: qty 1

## 2019-04-26 NOTE — Progress Notes (Signed)
Occupational Therapy Evaluation Patient Details Name: Mary Trevino MRN: [id number] DOB: 12-Aug-1926 Today's Date: 04/26/2019    History of Present Illness Very frail 86 year old female admitted with Acute respiratory failure with hypoxia; PMH includes HTN, HLD, legal blindness, mild HOH. Has had corneal implants (2014/2016)   Clinical Impression   PTA, pt lived at home with her family and was independent with ADL and mobility. Pt enjoys going to the [place] to exercise and play bingo. Session limited by complaints of nausea - nsg notified and gave meds during session. Pt on 15 L HFNC with desat into low 80s with minimal exertion @ bed level. Pt left in seated position in bed. Will follow acutely. Pt wants to DC home, but may need SNF if she does not progress.     Follow Up Recommendations  Home health OT;SNF;Supervision/Assistance - 24 hour(pending progress)    Equipment Recommendations  3 in 1 bedside commode    Recommendations for Other Services       Precautions / Restrictions Precautions Precautions: Fall Precaution Comments: desats; at risk for skin breakdown      Mobility Bed Mobility Overal bed mobility: Needs Assistance Bed Mobility: Rolling Rolling: Mod assist         General bed mobility comments: pt assisted with bed mobility and able to bridge to remove bedpan  Transfers                 General transfer comment: not attempted    Balance                                           ADL either performed or assessed with clinical judgement   ADL Overall ADL's : Needs assistance/impaired Eating/Feeding: Supervision/ safety;Set up;Sitting Eating/Feeding Details (indicate cue type and reason): poor appetite Grooming: Moderate assistance;Bed level   Upper Body Bathing: Moderate assistance;Bed level   Lower Body Bathing: Maximal assistance;Bed level   Upper Body Dressing : Moderate assistance;Bed level   Lower Body Dressing:  Maximal assistance;Bed level               Functional mobility during ADLs: (not attempted this session) General ADL Comments: Pt complaining of nausea; nsg notified. Pt cleaned up from being on bed pan; left in chair position in bed     Vision Baseline Vision/History: Legally blind       Perception     Praxis      Pertinent Vitals/Pain Pain Assessment: Faces Faces Pain Scale: Hurts even more Pain Location: abdomen; with mobility Pain Descriptors / Indicators: Cramping;Discomfort;Grimacing;Guarding;Moaning Pain Intervention(s): Limited activity within patient's tolerance     Hand Dominance     Extremity/Trunk Assessment Upper Extremity Assessment Upper Extremity Assessment: Generalized weakness   Lower Extremity Assessment Lower Extremity Assessment: Defer to PT evaluation   Cervical / Trunk Assessment Cervical / Trunk Assessment: Kyphotic   Communication Communication Communication: HOH;No difficulties   Cognition Arousal/Alertness: Awake/alert Behavior During Therapy: Flat affect Overall Cognitive Status: No family/caregiver present to determine baseline cognitive functioning                                 General Comments: cognition most liely at baseline; will further assess   General Comments       Exercises Exercises: General Upper Extremity General Exercises - Upper Extremity Shoulder  Flexion: AAROM;Both;10 reps;Seated Shoulder ABduction: AAROM;Both;10 reps;Seated Elbow Flexion: AROM;Both;10 reps;Seated Elbow Extension: AROM;Both;10 reps;Seated General Exercises - Lower Extremity Heel Slides: AAROM;Both;10 reps;Supine Hip ABduction/ADduction: AAROM;Both;10 reps;Supine   Shoulder Instructions      Home Living Family/patient expects to be discharged to:: Private residence Living Arrangements: Children Available Help at Discharge: Family Type of Home: House Home Access: Stairs to enter Technical brewer of Steps:  one Entrance Stairs-Rails: Right Home Layout: One level     Bathroom Shower/Tub: Teacher, early years/pre: Handicapped height Bathroom Accessibility: Yes How Accessible: Accessible via [place]     Additional Comments: Other than safety bars, no AD. Per daughter, Ebony Cargo (telephonic communication), patient was independent in all ADLs. Daughter and son in law are currently COVID+, but usually do all grocery shopping, and patient has community transport for all appts.      Prior Functioning/Environment Level of Independence: Independent                 OT Problem List: Decreased strength;Decreased activity tolerance;Impaired balance (sitting and/or standing);Decreased safety awareness;Decreased knowledge of use of DME or AE;Cardiopulmonary status limiting activity;Pain      OT Treatment/Interventions: Self-care/ADL training;Therapeutic exercise;Neuromuscular education;Energy conservation;DME and/or AE instruction;Therapeutic activities;Patient/family education;Balance training;Visual/perceptual remediation/compensation    OT Goals(Current goals can be found in the care plan section) Acute Rehab OT Goals Patient Stated Goal: Just want to get well and go home with my family OT Goal Formulation: With patient Time For Goal Achievement: 05/10/19 Potential to Achieve Goals: Good  OT Frequency: Min 3X/week   Barriers to D/C:            Co-evaluation              AM-PAC OT "6 Clicks" Daily Activity     Outcome Measure Help from another person eating meals?: A Little Help from another person taking care of personal grooming?: A Lot Help from another person toileting, which includes using toliet, bedpan, or urinal?: A Lot Help from another person bathing (including washing, rinsing, drying)?: A Lot Help from another person to put on and taking off regular upper body clothing?: A Lot Help from another person to put on and taking off regular lower body clothing?:  A Lot 6 Click Score: 13   End of Session Equipment Utilized During Treatment: Oxygen(15 L) Nurse Communication: Mobility status  Activity Tolerance: Patient limited by fatigue;Patient limited by pain(pain/nausea) Patient left: in bed;with call bell/phone within reach;with bed alarm set;with nursing/sitter in room  OT Visit Diagnosis: Unsteadiness on feet (R26.81);Other abnormalities of gait and mobility (R26.89);Muscle weakness (generalized) (M62.81);Pain Pain - part of body: (stomach)                Time: [id number] OT Time Calculation (min): 17 min Charges:  OT General Charges $OT Visit: 1 Visit OT Evaluation $OT Eval Moderate Complexity: [place], OT/L   Acute OT Clinical Specialist [place] Pager [phone] Office [phone]   [facility] 04/26/2019, 5:52 PM

## 2019-04-26 NOTE — Progress Notes (Signed)
PROGRESS NOTE                                                                                                                                                                                                             Patient Demographics:    Mary Trevino, is a 86 y.o. female, DOB - 02/25/1927, [id number]  Outpatient Primary MD for the patient is Debbrah Alar, NP   Admit date - 05/12/2019   LOS - 1  Chief Complaint  Patient presents with  . Shortness of Breath       Brief Narrative: Patient is a 86 y.o. female with PMHx of HTN, HLD, glaucoma, legal blindness, history of corneal transplantation who presented as a transfer from [place] on 1/13 for evaluation of severe hypoxemia secondary to COVID-19 pneumonia.    She was diagnosed with COVID-19 on 1/5 and was isolating at home.   Subjective:    Mary Trevino today remains stable on 10 L of oxygen this morning.   Assessment  & Plan :   Acute Hypoxic Resp Failure due to Covid 19 Viral pneumonia: Still very hypoxic on 10 L-but better than yesterday.  Continue with steroids/remdesivir.  Patient is s/p Actemra on 1/13.   Fever: afebrile  O2 requirements:  SpO2: 91 % O2 Flow Rate (L/min): 10 L/min   COVID-19 Labs: Recent Labs    04/23/2019 1318 04/26/19 0119  DDIMER 1.89*  --   FERRITIN 582* 601*  LDH 380*  --   CRP 32.8* 28.9*    No results found for: BNP  Recent Labs  Lab 04/21/2019 1318  PROCALCITON 0.41    No results found for: SARSCOV2NAA   COVID-19 medications: Steroids: 1/13>> Remdesivir: 1/13>> Actemra: 1/13 x 1  Other medications: Lasix: Euvolemic-not indicated but will try maintain negative balance. Antibiotics: Doubt bacterial infection-received Rocephin/Zithromax x1 in the emergency room Insulin: CBG stable with SSI.  Prone/Incentive Spirometry: encouraged patient to lie prone for 3-4 hours at a time for a total of  16 hours a day, and to encourage incentive spirometry use 3-4/hour.  DVT Prophylaxis  :  Lovenox   Transaminitis: Likely secondary to Covid-appears mild-follow for now.  HTN: Controlled-continue amlodipine and metoprolol  HLD: Continue statin  History of glaucoma/corneal transplantation/legal blindness: Resume eyedrops.  Consults  :  None  Procedures  :  None  ABG: No results found for: PHART, PCO2ART, PO2ART, HCO3, TCO2, ACIDBASEDEF, O2SAT  Vent Settings: N/A  Condition - Stable  Family Communication  : Left a voicemail for patient's daughter on 1/14.  Code Status :  DNR  Diet :  Diet Order            Diet Heart Room service appropriate? Yes; Fluid consistency: Thin  Diet effective now               Disposition Plan  :  Remain hospitalized-suspect will require SNF on discharge.  Will need to follow clinical trajectory to determine appropriate disposition.  Barriers to discharge: Hypoxia requiring O2 supplementation/complete 5 days of IV Remdesivir  Antimicorbials  :    Anti-infectives (From admission, onward)   Start     Dose/Rate Route Frequency Ordered Stop   04/26/19 1000  remdesivir 100 mg in sodium chloride 0.9 % 100 mL IVPB     100 mg 200 mL/hr over 30 Minutes Intravenous Daily 04/26/2019 1500 04/30/19 0959   04/17/2019 1500  remdesivir 100 mg in sodium chloride 0.9 % 100 mL IVPB     100 mg 200 mL/hr over 30 Minutes Intravenous Every 30 min 04/18/2019 1451 04/16/2019 1614   04/27/2019 1345  cefTRIAXone (ROCEPHIN) 1 g in sodium chloride 0.9 % 100 mL IVPB     1 g 200 mL/hr over 30 Minutes Intravenous  Once 04/27/2019 1343 04/24/2019 1450   04/26/2019 1345  azithromycin (ZITHROMAX) 500 mg in sodium chloride 0.9 % 250 mL IVPB     500 mg 250 mL/hr over 60 Minutes Intravenous  Once 04/13/2019 1343 04/16/2019 1514      Inpatient Medications  Scheduled Meds: . amLODipine  5 mg Oral Daily  . vitamin C  500 mg Oral Daily  . atorvastatin  10 mg Oral Daily  . brimonidine  1  drop Both Eyes BID   And  . timolol  1 drop Both Eyes BID  . enoxaparin (LOVENOX) injection  40 mg Subcutaneous Q24H  . escitalopram  10 mg Oral Daily  . insulin aspart  0-9 Units Subcutaneous TID WC  . loteprednol  1 drop Right Eye QID  . methylPREDNISolone (SOLU-MEDROL) injection  40 mg Intravenous Q12H  . metoprolol succinate  25 mg Oral Daily  . nortriptyline  20 mg Oral QHS  . pantoprazole  40 mg Oral Daily  . prednisoLONE acetate  1 drop Left Eye BID  . sodium chloride flush  3 mL Intravenous Q12H  . zinc sulfate  220 mg Oral Daily   Continuous Infusions: . remdesivir 100 mg in NS 100 mL 100 mg (04/26/19 0902)   PRN Meds:.acetaminophen, albuterol, chlorpheniramine-HYDROcodone, guaiFENesin-dextromethorphan, ondansetron **OR** ondansetron (ZOFRAN) IV, polyethylene glycol   Time Spent in minutes 35   See all Orders from today for further details   Oren Binet M.D on 04/26/2019 at 1:37 PM  To page go to [email or web address] - use universal password  [facility] -  Office  [phone]    Objective:   Vitals:   04/26/19 0646 04/26/19 0720 04/26/19 1100 04/26/19 1230  BP:   120/73   Pulse:  85 83 80  Resp:   20   Temp:  (!) 97.5 F (36.4 C) 97.9 F (36.6 C)   TempSrc:  Oral Oral   SpO2: 95% (!) 89% 90% 91%  Weight:      Height:        Wt Readings from Last 3 Encounters:  04/19/2019 61.3 kg  04/17/19 63.5 kg  08/09/18 65 kg     Intake/Output Summary (Last 24 hours) at 04/26/2019 1337 Last data filed at 04/26/2019 [id number] Gross per 24 hour  Intake 3 ml  Output 100 ml  Net -97 ml     Physical Exam Gen Exam:Alert awake-not in any distress HEENT:atraumatic, normocephalic Chest: B/L clear to auscultation anteriorly CVS:S1S2 regular Abdomen:soft non tender, non distended Extremities:no edema Neurology: Non focal Skin: no rash   Data Review:    CBC Recent Labs  Lab 05/10/2019 1318 05/13/2019 1800 04/26/19 0119  WBC 11.0* 11.0* 9.3  HGB 14.7 14.5  13.6  HCT 45.0 43.4 40.8  PLT 275 244 267  MCV 84.9 84.6 84.1  MCH 27.7 28.3 28.0  MCHC 32.7 33.4 33.3  RDW 14.2 14.4 14.4  LYMPHSABS 0.8  --  0.8  MONOABS 0.6  --  0.3  EOSABS 0.0  --  0.0  BASOSABS 0.0  --  0.0    Chemistries  Recent Labs  Lab 05/11/2019 1318 05/09/2019 1800 04/26/19 0119  NA 140  --  139  K 4.2  --  3.9  CL 101  --  105  CO2 28  --  22  GLUCOSE 157*  --  187*  BUN 21  --  27*  CREATININE 0.99 0.98 1.02*  CALCIUM 9.9  --  8.6*  AST 55*  --  73*  ALT 52*  --  78*  ALKPHOS 38  --  35*  BILITOT 0.5  --  0.5   ------------------------------------------------------------------------------------------------------------------ Recent Labs    05/11/2019 1318  TRIG 128    Lab Results  Component Value Date   HGBA1C 6.1 (H) 04/26/2019   ------------------------------------------------------------------------------------------------------------------ No results for input(s): TSH, T4TOTAL, T3FREE, THYROIDAB in the last 72 hours.  Invalid input(s): FREET3 ------------------------------------------------------------------------------------------------------------------ Recent Labs    04/24/2019 1318 04/26/19 0119  FERRITIN 582* 601*    Coagulation profile No results for input(s): INR, PROTIME in the last 168 hours.  Recent Labs    05/05/2019 1318  DDIMER 1.89*    Cardiac Enzymes No results for input(s): CKMB, TROPONINI, MYOGLOBIN in the last 168 hours.  Invalid input(s): CK ------------------------------------------------------------------------------------------------------------------ No results found for: BNP  Micro Results Recent Results (from the past 240 hour(s))  Urine culture     Status: Abnormal   Collection Time: 04/17/19  3:40 PM   Specimen: Urine, Random  Result Value Ref Range Status   Specimen Description   Final    URINE, RANDOM Performed at [facility], [address] [place], [place] [address]    Special Requests    Final    NONE Performed at [facility], [address] [place], [place] [address]    Culture >=100,000 COLONIES/mL ESCHERICHIA COLI (A)  Final   Report Status 04/19/2019 FINAL  Final   Organism ID, Bacteria ESCHERICHIA COLI (A)  Final      Susceptibility   Escherichia coli - MIC*    AMPICILLIN 4 SENSITIVE Sensitive     CEFAZOLIN <=4 SENSITIVE Sensitive     CEFTRIAXONE <=0.25 SENSITIVE Sensitive     CIPROFLOXACIN <=0.25 SENSITIVE Sensitive     GENTAMICIN <=1 SENSITIVE Sensitive     IMIPENEM <=0.25 SENSITIVE Sensitive     NITROFURANTOIN <=16 SENSITIVE Sensitive     TRIMETH/SULFA <=20 SENSITIVE Sensitive     AMPICILLIN/SULBACTAM <=2 SENSITIVE Sensitive     PIP/TAZO <=4 SENSITIVE Sensitive     * >=100,000 COLONIES/mL ESCHERICHIA COLI  SARS Coronavirus 2  Ag (30 min TAT) - Urine, Clean Catch     Status: Abnormal   Collection Time: 04/17/19  3:41 PM   Specimen: Urine, Clean Catch; Nasal Swab  Result Value Ref Range Status   SARS Coronavirus 2 Ag POSITIVE (A) NEGATIVE Final    Comment: CRITICAL RESULT CALLED TO, READ BACK BY AND VERIFIED WITH: CINDY R. RN AT 1419 ON 04/17/19 BY [email or web address] (NOTE) SARS-CoV-2 antigen PRESENT. Positive results indicate the presence of viral antigens, but clinical correlation with patient history and other diagnostic information is necessary to determine patient infection status.  Positive results do not rule out bacterial infection or co-infection  with other viruses. False positive results are rare but can occur, and confirmatory RT-PCR testing may be appropriate in some circumstances. The expected result is Negative. Fact Sheet for Patients: PodPark.tn Fact Sheet for Providers: GiftContent.is  This test is not yet approved or cleared by the [place] FDA and  has been authorized for detection and/or diagnosis of SARS-CoV-2 by FDA under an Emergency Use Authorization (EUA).  This EUA  will remain in effect (meaning this test can be used) for the duration of  the [place] VID-19 declaration under Section 564(b)(1) of the Act, 21 U.S.C. section 360bbb-3(b)(1), unless the authorization is terminated or revoked sooner. Performed at [facility], [address] [place], [place] [address]   Blood Culture (routine x 2)     Status: None (Preliminary result)   Collection Time: 04/18/2019  1:03 PM   Specimen: Right Antecubital; Blood  Result Value Ref Range Status   Specimen Description   Final    RIGHT ANTECUBITAL Performed at [facility], [address] [place], [place] [address]    Special Requests   Final    BOTTLES DRAWN AEROBIC AND ANAEROBIC Blood Culture results may not be optimal due to an inadequate volume of blood received in culture bottles Performed at [facility], [address] [place], [place] [address]    Culture   Final    NO GROWTH < 24 HOURS Performed at [facility], [place] [address], [place], [place] [address]    Report Status PENDING  Incomplete  Blood Culture (routine x 2)     Status: None (Preliminary result)   Collection Time: 04/24/2019  1:18 PM   Specimen: BLOOD LEFT HAND  Result Value Ref Range Status   Specimen Description   Final    BLOOD LEFT HAND Performed at [facility], [address] [place], [place] [address]    Special Requests   Final    BOTTLES DRAWN AEROBIC AND ANAEROBIC Blood Culture results may not be optimal due to an inadequate volume of blood received in culture bottles Performed at [facility], [address] [place], [place] [address]    Culture   Final    NO GROWTH < 24 HOURS Performed at [facility], [place] [address] [place], [place] [address]    Report Status PENDING  Incomplete  C difficile quick scan w PCR reflex     Status: None   Collection Time: 05/05/2019 10:38 PM   Specimen: STOOL  Result Value Ref Range Status   C Diff antigen NEGATIVE  NEGATIVE Final   C Diff toxin NEGATIVE NEGATIVE Final   C Diff interpretation No C. difficile detected.  Final    Comment: Performed at [facility], [place] [address] [place], [place] [address]  Radiology Reports DG Chest Port 1 View  Result Date: 04/17/2019 CLINICAL DATA:  Brought in by EMS from home for SOB , COVID+, cough x 2 days EXAM: PORTABLE CHEST 1 VIEW COMPARISON:  04/17/2019 FINDINGS: There hazy airspace lung opacities predominantly in the mid to lower lungs, new or increased when compared to the prior exam. No convincing pleural effusion.  No pneumothorax. Cardiac silhouette is normal in size. No mediastinal or hilar masses. Skeletal structures are grossly intact. IMPRESSION: 1. Bilateral hazy airspace lung opacities have developed since prior study consistent multifocal pneumonia due to COVID-19 infection. Electronically Signed   By: Lajean Manes M.D.   On: 04/24/2019 13:39   DG Chest Portable 1 View  Result Date: 04/17/2019 CLINICAL DATA:  Cough and shortness of breath.  Hypertension. EXAM: PORTABLE CHEST 1 VIEW COMPARISON:  April 01, 2017 FINDINGS: There is no appreciable edema or consolidation. Heart is mildly enlarged with pulmonary vascularity normal. No adenopathy. Mild prominence of the aorta is stable. There is aortic atherosclerosis. There is degenerative change in the thoracic spine. No adenopathy evident. IMPRESSION: No edema or consolidation. Stable cardiac prominence. Prominence of the aorta may reflect chronic hypertension and appears stable. Aortic Atherosclerosis (ICD10-I70.0). Electronically Signed   By: Lowella Grip III M.D.   On: 04/17/2019 13:02

## 2019-04-26 NOTE — Evaluation (Signed)
Physical Therapy Evaluation Patient Details Name: Mary Trevino MRN: [id number] DOB: [birth year] Today's Date: 04/26/2019   History of Present Illness  Very frail 86 year old female admitted with Acute respiratory failure with hypoxia; PMH includes HTN, HLD, legal blindness, mild HOH. Has had corneal implants (2014/2016)  Clinical Impression  This very frail , generally weak female patient is receptive to PT. She has lived with a daughter Ebony Cargo) for the last 3 years. The daughter confirmed that the patient has been independent in all ADLs- no AD for ambulation, living in a one level home. Daughter Pandora Leiter in [place] do all driving for groceries and they use a community transport for patient to get to appts. She was mod-max for all bed mobility. Currently on HFNC 10 LPM. Of note: she is being tested for C-Diff today secondary to liquid stools. She should benefit from PT to address goals for optimal functional outcomes. She indicates that she wants to home with her daughter. She indicated that her right hip has in the recent past been hurting, sudden onset, no falls- but denied pain during PT evaluation.     Follow Up Recommendations (Have not determined if patient and daughter would be receptive to SNF- she wants to go home with family)    Equipment Recommendations       Recommendations for Other Services       Precautions / Restrictions Precautions Precautions: Fall Restrictions Weight Bearing Restrictions: No      Mobility  Bed Mobility Overal bed mobility: Needs Assistance             General bed mobility comments: Mod A for all bed mobility. Today, after sitting on EOB for @ 6 minutes with cues for postural awareness, she suddenly requested to use bed pain. In repositioning in bed (supine to up to [facility]), max of 2  Transfers   Equipment used: (Did not attempt any OOB as she was exhausted after sitting on edge of bed, and she was very focused on "not eating for a few days and  being exhausted")             General transfer comment: Could not attempt any OOB as patient complained of exhaustion after sitting on edge of bed. She is currently on HFNC 10 LPM. She did not use oxygen at home. She is very frail and generally weak.  Ambulation/Gait                Stairs            Wheelchair Mobility    Modified Rankin (Stroke Patients Only)       Balance Overall balance assessment: (RIght side and posterior leaning on sitting edge of bed.)                                           Pertinent Vitals/Pain Pain Assessment: No/denies pain    Home Living Family/patient expects to be discharged to:: Private residence Living Arrangements: Children Available Help at Discharge: Family   Home Access: Stairs to enter Entrance Stairs-Rails: Right Entrance Stairs-Number of Steps: one Home Layout: One level   Additional Comments: Other than safety bars, no AD. Per daughter, Ebony Cargo (telephonic communication), patient was independent in all ADLs. Daughter and son in law are currently COVID+, but usually do all grocery shopping, and patient has community transport for all appts.  Prior Function Level of Independence: Independent         Comments: This was confirmed by daughter Ebony Cargo     Hand Dominance   Dominant Hand: Right    Extremity/Trunk Assessment   Upper Extremity Assessment Upper Extremity Assessment: Defer to OT evaluation    Lower Extremity Assessment Lower Extremity Assessment: Defer to PT evaluation;Generalized weakness    Cervical / Trunk Assessment Cervical / Trunk Assessment: Kyphotic  Communication   Communication: HOH;No difficulties(She is legally blind-seeing "shadows")  Cognition Arousal/Alertness: Awake/alert Behavior During Therapy: WFL for tasks assessed/performed Overall Cognitive Status: Within Functional Limits for tasks assessed                                         General Comments General comments (skin integrity, edema, etc.): Skin- poor tone and turgor-> prone to skin tears and bruising. Mild edema in LEs    Exercises General Exercises - Lower Extremity Ankle Circles/Pumps: Supine;AROM;AAROM Heel Slides: AROM;Supine;AAROM Hip ABduction/ADduction: Supine;AROM;AAROM Other Exercises Other Exercises: Practiced IS and able to achieve 750  for 8 out of 10 repetitions. Reminded to use every hour and it was placed on her bedside table. She is legally blind but she states that she sees the unit, and did have good return demo.   Assessment/Plan    PT Assessment Patient needs continued PT services  PT Problem List Decreased strength;Decreased activity tolerance;Decreased balance;Cardiopulmonary status limiting activity;Decreased mobility       PT Treatment Interventions (to be determined. Did not [place] any AD at home)    PT Goals (Current goals can be found in the Care Plan section)  Acute Rehab PT Goals Patient Stated Goal: Just want to get well and go home with my family PT Goal Formulation: With patient Time For Goal Achievement: 05/10/19 Potential to Achieve Goals: Good    Frequency Min 3X/week   Barriers to discharge        Co-evaluation               AM-PAC PT "6 Clicks" Mobility  Outcome Measure Help needed turning from your back to your side while in a flat bed without using bedrails?: A Lot Help needed moving from lying on your back to sitting on the side of a flat bed without using bedrails?: A Lot Help needed moving to and from a bed to a chair (including a wheelchair)?: A Lot Help needed standing up from a chair using your arms (e.g., wheelchair or bedside chair)?: A Lot Help needed to walk in hospital room?: A Lot Help needed climbing 3-5 steps with a railing? : A Lot 6 Click Score: 12    End of Session   Activity Tolerance: Patient limited by fatigue(She states that recently her RIGHT hip began to hurt, but  did not complain of this during PT) Patient left: in bed   PT Visit Diagnosis: Muscle weakness (generalized) (M62.81)    Time: 1000-1050 PT Time Calculation (min) (ACUTE ONLY): 50 min   Charges:   PT Evaluation $PT Eval Moderate Complexity: 1 Mod PT Treatments $Therapeutic Activity: 23-37 mins        Rollen Sox, PT # [phone] CGV cell   Casandra Doffing 04/26/2019, 12:47 PM

## 2019-04-27 LAB — GLUCOSE, CAPILLARY
Glucose-Capillary: 187 mg/dL — ABNORMAL HIGH (ref 70–99)
Glucose-Capillary: 244 mg/dL — ABNORMAL HIGH (ref 70–99)
Glucose-Capillary: 235 mg/dL — ABNORMAL HIGH (ref 70–99)
Glucose-Capillary: 215 mg/dL — ABNORMAL HIGH (ref 70–99)

## 2019-04-27 LAB — CBC WITH DIFFERENTIAL/PLATELET
Abs Immature Granulocytes: 0.34 10*3/uL — ABNORMAL HIGH (ref 0.00–0.07)
Basophils Absolute: 0 10*3/uL (ref 0.0–0.1)
Basophils Relative: 0 %
Eosinophils Absolute: 0 10*3/uL (ref 0.0–0.5)
Eosinophils Relative: 0 %
HCT: 44.2 % (ref 36.0–46.0)
Hemoglobin: 14.7 g/dL (ref 12.0–15.0)
Immature Granulocytes: 3 %
Lymphocytes Relative: 6 %
Lymphs Abs: 0.8 10*3/uL (ref 0.7–4.0)
MCH: 27.9 pg (ref 26.0–34.0)
MCHC: 33.3 g/dL (ref 30.0–36.0)
MCV: 84 fL (ref 80.0–100.0)
Monocytes Absolute: 0.8 10*3/uL (ref 0.1–1.0)
Monocytes Relative: 6 %
Neutro Abs: 11.2 10*3/uL — ABNORMAL HIGH (ref 1.7–7.7)
Neutrophils Relative %: 85 %
Platelets: 350 10*3/uL (ref 150–400)
RBC: 5.26 MIL/uL — ABNORMAL HIGH (ref 3.87–5.11)
RDW: 14.5 % (ref 11.5–15.5)
WBC: 13.2 10*3/uL — ABNORMAL HIGH (ref 4.0–10.5)
nRBC: 0 % (ref 0.0–0.2)

## 2019-04-27 LAB — COMPREHENSIVE METABOLIC PANEL
ALT: 73 U/L — ABNORMAL HIGH (ref 0–44)
AST: 48 U/L — ABNORMAL HIGH (ref 15–41)
Albumin: 2.8 g/dL — ABNORMAL LOW (ref 3.5–5.0)
Alkaline Phosphatase: 39 U/L (ref 38–126)
Anion gap: 13 (ref 5–15)
BUN: 51 mg/dL — ABNORMAL HIGH (ref 8–23)
CO2: 23 mmol/L (ref 22–32)
Calcium: 9.3 mg/dL (ref 8.9–10.3)
Chloride: 106 mmol/L (ref 98–111)
Creatinine, Ser: 1.21 mg/dL — ABNORMAL HIGH (ref 0.44–1.00)
GFR calc Af Amer: 46 mL/min — ABNORMAL LOW (ref >60)
GFR calc non Af Amer: 39 mL/min — ABNORMAL LOW (ref >60)
Glucose, Bld: 171 mg/dL — ABNORMAL HIGH (ref 70–99)
Potassium: 4 mmol/L (ref 3.5–5.1)
Sodium: 142 mmol/L (ref 135–145)
Total Bilirubin: 0.3 mg/dL (ref 0.3–1.2)
Total Protein: 7.1 g/dL (ref 6.5–8.1)

## 2019-04-27 LAB — FERRITIN: Ferritin: 1017 ng/mL — ABNORMAL HIGH (ref 11–307)

## 2019-04-27 LAB — C-REACTIVE PROTEIN: CRP: 20.8 mg/dL — ABNORMAL HIGH (ref <1.0)

## 2019-04-27 LAB — D-DIMER, QUANTITATIVE: D-Dimer, Quant: 1.35 ug/mL-FEU — ABNORMAL HIGH (ref 0.00–0.50)

## 2019-04-27 MED ORDER — PROMETHAZINE HCL 25 MG/ML IJ SOLN
12.5000 mg | Freq: Four times a day (QID) | INTRAMUSCULAR | Status: AC | PRN
  Administered 2019-04-27: 10:00:00 12.5 mg via INTRAVENOUS
  Filled 2019-04-27: qty 1

## 2019-04-27 NOTE — Progress Notes (Addendum)
Late Entry for 04/26/19 @ 1939: Patient seen and assessed. Physical assessment completed via computerized charting per [facility] policy. Patient assisted with cleaning of dentures/partials. Toothbrush and toothpaste provided for brushing of teeth. No family present at bedside. Pt had to be oriented to time. Side rails up x 3. Bed in lowest position and locked. Call light in reach.   Family Update/ Plan of Care: Attempt to call daughter, Ebony Cargo [phone]) unsuccessful. Voice mail left.

## 2019-04-27 NOTE — Progress Notes (Signed)
PROGRESS NOTE                                                                                                                                                                                                             Patient Demographics:    Mary Trevino, is a 86 y.o. female, DOB - 1927-02-24, HR:[id number]  Outpatient Primary MD for the patient is Debbrah Alar, NP   Admit date - 04/24/2019   LOS - 2  Chief Complaint  Patient presents with  . Shortness of Breath       Brief Narrative: Patient is a 86 y.o. female with PMHx of HTN, HLD, glaucoma, legal blindness, history of corneal transplantation who presented as a transfer from [place] on 1/13 for evaluation of severe hypoxemia secondary to COVID-19 pneumonia.    She was diagnosed with COVID-19 on 1/5 and was isolating at home.   Subjective:   No major events overnight-she was on 10 L yesterday-on 15 L this morning.  She appears stable-and not in any distress.   Assessment  & Plan :   Acute Hypoxic Resp Failure due to Covid 19 Viral pneumonia: Still very hypoxic-on 15 L-but not in any distress.  Continue steroids and remdesivir.    Fever: afebrile  O2 requirements:  SpO2: 90 % O2 Flow Rate (L/min): 15 L/min   COVID-19 Labs: Recent Labs    05/05/2019 1318 04/26/19 0119 04/27/19 0053  DDIMER 1.89*  --   --   FERRITIN 582* 601* 1,017*  LDH 380*  --   --   CRP 32.8* 28.9* 20.8*    No results found for: BNP  Recent Labs  Lab 04/23/2019 1318  PROCALCITON 0.41    No results found for: SARSCOV2NAA   COVID-19 medications: Steroids: 1/13>> Remdesivir: 1/13>> Actemra: 1/13 x 1  Other medications: Lasix: Euvolemic-hold diuretics today. Antibiotics: Doubt bacterial infection-received Rocephin/Zithromax x1 in the emergency room Insulin: CBG stable with SSI.  Prone/Incentive Spirometry: encouraged patient to lie prone for 3-4 hours  at a time for a total of 16 hours a day, and to encourage incentive spirometry use 3-4/hour.  DVT Prophylaxis  :  Lovenox   Transaminitis: Secondary to Covid--mild-follow.  HTN: BP stable-continue manidipine and metoprolol.  HLD: Continue statin  History of glaucoma/corneal transplantation/legal blindness: On usual home regimen of eyedrops.  Consults  :  None  Procedures  :  None  ABG: No results found for: PHART, PCO2ART, PO2ART, HCO3, TCO2, ACIDBASEDEF, O2SAT  Vent Settings: N/A  Condition - Stable  Family Communication  : Left a voicemail for patient's daughter on 1/15.  Code Status :  DNR  Diet :  Diet Order            Diet Heart Room service appropriate? Yes; Fluid consistency: Thin  Diet effective now               Disposition Plan  :  Remain hospitalized-suspect will require SNF on discharge.  Will need to follow clinical trajectory to determine appropriate disposition.  Barriers to discharge: Hypoxia requiring O2 supplementation/complete 5 days of IV Remdesivir  Antimicorbials  :    Anti-infectives (From admission, onward)   Start     Dose/Rate Route Frequency Ordered Stop   04/26/19 1000  remdesivir 100 mg in sodium chloride 0.9 % 100 mL IVPB     100 mg 200 mL/hr over 30 Minutes Intravenous Daily 04/21/2019 1500 04/30/19 0959   05/05/2019 1500  remdesivir 100 mg in sodium chloride 0.9 % 100 mL IVPB     100 mg 200 mL/hr over 30 Minutes Intravenous Every 30 min 04/27/2019 1451 05/13/2019 1614   04/20/2019 1345  cefTRIAXone (ROCEPHIN) 1 g in sodium chloride 0.9 % 100 mL IVPB     1 g 200 mL/hr over 30 Minutes Intravenous  Once 05/11/2019 1343 04/22/2019 1450   05/12/2019 1345  azithromycin (ZITHROMAX) 500 mg in sodium chloride 0.9 % 250 mL IVPB     500 mg 250 mL/hr over 60 Minutes Intravenous  Once 04/24/2019 1343 04/29/2019 1514      Inpatient Medications  Scheduled Meds: . amLODipine  5 mg Oral Daily  . vitamin C  500 mg Oral Daily  . atorvastatin  10 mg Oral  Daily  . brimonidine  1 drop Both Eyes BID   And  . timolol  1 drop Both Eyes BID  . enoxaparin (LOVENOX) injection  40 mg Subcutaneous Q24H  . escitalopram  10 mg Oral Daily  . insulin aspart  0-9 Units Subcutaneous TID WC  . loteprednol  1 drop Right Eye QID  . methylPREDNISolone (SOLU-MEDROL) injection  40 mg Intravenous Q12H  . metoprolol succinate  25 mg Oral Daily  . nortriptyline  20 mg Oral QHS  . pantoprazole  40 mg Oral Daily  . prednisoLONE acetate  1 drop Left Eye BID  . sodium chloride flush  3 mL Intravenous Q12H  . zinc sulfate  220 mg Oral Daily   Continuous Infusions: . remdesivir 100 mg in NS 100 mL 100 mg (04/27/19 0856)   PRN Meds:.acetaminophen, albuterol, chlorpheniramine-HYDROcodone, guaiFENesin-dextromethorphan, loperamide, ondansetron **OR** ondansetron (ZOFRAN) IV, polyethylene glycol, promethazine   Time Spent in minutes 35   See all Orders from today for further details   Oren Binet M.D on 04/27/2019 at 12:05 PM  To page go to [email or web address] - use universal password  [facility] -  Office  [phone]    Objective:   Vitals:   04/27/19 0130 04/27/19 0400 04/27/19 0735 04/27/19 0740  BP: 116/76 117/76  128/81  Pulse: 77 88  95  Resp: 14 (!) 22    Temp: 98.3 F (36.8 C) 98.1 F (36.7 C)  98.3 F (36.8 C)  TempSrc: Oral Oral  Oral  SpO2:  90% (!) 84% 90%  Weight:      Height:  Wt Readings from Last 3 Encounters:  04/24/2019 61.3 kg  04/17/19 63.5 kg  08/09/18 65 kg     Intake/Output Summary (Last 24 hours) at 04/27/2019 1205 Last data filed at 04/27/2019 0856 Gross per 24 hour  Intake 703 ml  Output 570 ml  Net 133 ml     Physical Exam Gen Exam:Alert awake-not in any distress-lying comfortably in bed. HEENT:atraumatic, normocephalic Chest: B/L clear to auscultation anteriorly CVS:S1S2 regular Abdomen:soft non tender, non distended Extremities:no edema Neurology: Non focal-but with generalized weakness.  Skin: no rash   Data Review:    CBC Recent Labs  Lab 04/29/2019 1318 05/05/2019 1800 04/26/19 0119 04/27/19 0053  WBC 11.0* 11.0* 9.3 13.2*  HGB 14.7 14.5 13.6 14.7  HCT 45.0 43.4 40.8 44.2  PLT 275 244 267 350  MCV 84.9 84.6 84.1 84.0  MCH 27.7 28.3 28.0 27.9  MCHC 32.7 33.4 33.3 33.3  RDW 14.2 14.4 14.4 14.5  LYMPHSABS 0.8  --  0.8 0.8  MONOABS 0.6  --  0.3 0.8  EOSABS 0.0  --  0.0 0.0  BASOSABS 0.0  --  0.0 0.0    Chemistries  Recent Labs  Lab 04/24/2019 1318 04/16/2019 1800 04/26/19 0119 04/27/19 0053  NA 140  --  139 142  K 4.2  --  3.9 4.0  CL 101  --  105 106  CO2 28  --  22 23  GLUCOSE 157*  --  187* 171*  BUN 21  --  27* 51*  CREATININE 0.99 0.98 1.02* 1.21*  CALCIUM 9.9  --  8.6* 9.3  AST 55*  --  73* 48*  ALT 52*  --  78* 73*  ALKPHOS 38  --  35* 39  BILITOT 0.5  --  0.5 0.3   ------------------------------------------------------------------------------------------------------------------ Recent Labs    04/19/2019 1318  TRIG 128    Lab Results  Component Value Date   HGBA1C 6.1 (H) 04/26/2019   ------------------------------------------------------------------------------------------------------------------ No results for input(s): TSH, T4TOTAL, T3FREE, THYROIDAB in the last 72 hours.  Invalid input(s): FREET3 ------------------------------------------------------------------------------------------------------------------ Recent Labs    04/26/19 0119 04/27/19 0053  FERRITIN 601* 1,017*    Coagulation profile No results for input(s): INR, PROTIME in the last 168 hours.  Recent Labs    04/21/2019 1318  DDIMER 1.89*    Cardiac Enzymes No results for input(s): CKMB, TROPONINI, MYOGLOBIN in the last 168 hours.  Invalid input(s): CK ------------------------------------------------------------------------------------------------------------------ No results found for: BNP  Micro Results Recent Results (from the past 240 hour(s))  Urine  culture     Status: Abnormal   Collection Time: 04/17/19  3:40 PM   Specimen: Urine, Random  Result Value Ref Range Status   Specimen Description   Final    URINE, RANDOM Performed at [facility], [address] [place], [place] [address]    Special Requests   Final    NONE Performed at [facility], [address] [place], [place] [address]    Culture >=100,000 COLONIES/mL ESCHERICHIA COLI (A)  Final   Report Status 04/19/2019 FINAL  Final   Organism ID, Bacteria ESCHERICHIA COLI (A)  Final      Susceptibility   Escherichia coli - MIC*    AMPICILLIN 4 SENSITIVE Sensitive     CEFAZOLIN <=4 SENSITIVE Sensitive     CEFTRIAXONE <=0.25 SENSITIVE Sensitive     CIPROFLOXACIN <=0.25 SENSITIVE Sensitive     GENTAMICIN <=1 SENSITIVE Sensitive     IMIPENEM <=0.25 SENSITIVE Sensitive  NITROFURANTOIN <=16 SENSITIVE Sensitive     TRIMETH/SULFA <=20 SENSITIVE Sensitive     AMPICILLIN/SULBACTAM <=2 SENSITIVE Sensitive     PIP/TAZO <=4 SENSITIVE Sensitive     * >=100,000 COLONIES/mL ESCHERICHIA COLI  SARS Coronavirus 2 Ag (30 min TAT) - Urine, Clean Catch     Status: Abnormal   Collection Time: 04/17/19  3:41 PM   Specimen: Urine, Clean Catch; Nasal Swab  Result Value Ref Range Status   SARS Coronavirus 2 Ag POSITIVE (A) NEGATIVE Final    Comment: CRITICAL RESULT CALLED TO, READ BACK BY AND VERIFIED WITH: CINDY R. RN AT 1419 ON 04/17/19 BY [email or web address] (NOTE) SARS-CoV-2 antigen PRESENT. Positive results indicate the presence of viral antigens, but clinical correlation with patient history and other diagnostic information is necessary to determine patient infection status.  Positive results do not rule out bacterial infection or co-infection  with other viruses. False positive results are rare but can occur, and confirmatory RT-PCR testing may be appropriate in some circumstances. The expected result is Negative. Fact Sheet for Patients:  PodPark.tn Fact Sheet for Providers: GiftContent.is  This test is not yet approved or cleared by the [place] FDA and  has been authorized for detection and/or diagnosis of SARS-CoV-2 by FDA under an Emergency Use Authorization (EUA).  This EUA will remain in effect (meaning this test can be used) for the duration of  the [place] VID-19 declaration under Section 564(b)(1) of the Act, 21 U.S.C. section 360bbb-3(b)(1), unless the authorization is terminated or revoked sooner. Performed at [facility], [address] [place], [place] [address]   Blood Culture (routine x 2)     Status: None (Preliminary result)   Collection Time: 05/01/2019  1:03 PM   Specimen: Right Antecubital; Blood  Result Value Ref Range Status   Specimen Description   Final    RIGHT ANTECUBITAL Performed at [facility], [address] [place], [place] [address]    Special Requests   Final    BOTTLES DRAWN AEROBIC AND ANAEROBIC Blood Culture results may not be optimal due to an inadequate volume of blood received in culture bottles Performed at [facility], [address] [place], [place] [address]    Culture   Final    NO GROWTH < 24 HOURS Performed at [facility], [place] [address] [place], [place] [address]    Report Status PENDING  Incomplete  Blood Culture (routine x 2)     Status: None (Preliminary result)   Collection Time: 05/12/2019  1:18 PM   Specimen: BLOOD LEFT HAND  Result Value Ref Range Status   Specimen Description   Final    BLOOD LEFT HAND Performed at [facility], [address] [place], [place] [address]    Special Requests   Final    BOTTLES DRAWN AEROBIC AND ANAEROBIC Blood Culture results may not be optimal due to an inadequate volume of blood received in culture bottles Performed at [facility], [address] [place], [place] [address]    Culture   Final    NO  GROWTH < 24 HOURS Performed at [facility], [place] [address], [place], [place] [address]    Report Status PENDING  Incomplete  C difficile quick scan w PCR reflex     Status: None   Collection Time: 04/28/2019 10:38 PM   Specimen: STOOL  Result Value Ref Range Status   C  Diff antigen NEGATIVE NEGATIVE Final   C Diff toxin NEGATIVE NEGATIVE Final   C Diff interpretation No C. difficile detected.  Final    Comment: Performed at [facility], [place] [address] [place], [place] [address]    Radiology Reports DG Chest [place] 1 View  Result Date: 05/01/2019 CLINICAL DATA:  Brought in by EMS from home for SOB , COVID+, cough x 2 days EXAM: PORTABLE CHEST 1 VIEW COMPARISON:  04/17/2019 FINDINGS: There hazy airspace lung opacities predominantly in the mid to lower lungs, new or increased when compared to the prior exam. No convincing pleural effusion.  No pneumothorax. Cardiac silhouette is normal in size. No mediastinal or hilar masses. Skeletal structures are grossly intact. IMPRESSION: 1. Bilateral hazy airspace lung opacities have developed since prior study consistent multifocal pneumonia due to COVID-19 infection. Electronically Signed   By: Lajean Manes M.D.   On: 05/01/2019 13:39   DG Chest Portable 1 View  Result Date: 04/17/2019 CLINICAL DATA:  Cough and shortness of breath.  Hypertension. EXAM: PORTABLE CHEST 1 VIEW COMPARISON:  April 01, 2017 FINDINGS: There is no appreciable edema or consolidation. Heart is mildly enlarged with pulmonary vascularity normal. No adenopathy. Mild prominence of the aorta is stable. There is aortic atherosclerosis. There is degenerative change in the thoracic spine. No adenopathy evident. IMPRESSION: No edema or consolidation. Stable cardiac prominence. Prominence of the aorta may reflect chronic hypertension and appears stable. Aortic Atherosclerosis (ICD10-I70.0). Electronically Signed   By: Lowella Grip III M.D.   On: 04/17/2019 13:02

## 2019-04-28 LAB — CBC WITH DIFFERENTIAL/PLATELET
Abs Immature Granulocytes: 0.46 10*3/uL — ABNORMAL HIGH (ref 0.00–0.07)
Basophils Absolute: 0.1 10*3/uL (ref 0.0–0.1)
Basophils Relative: 1 %
Eosinophils Absolute: 0 10*3/uL (ref 0.0–0.5)
Eosinophils Relative: 0 %
HCT: 47.5 % — ABNORMAL HIGH (ref 36.0–46.0)
Hemoglobin: 15.8 g/dL — ABNORMAL HIGH (ref 12.0–15.0)
Immature Granulocytes: 3 %
Lymphocytes Relative: 4 %
Lymphs Abs: 0.6 10*3/uL — ABNORMAL LOW (ref 0.7–4.0)
MCH: 28 pg (ref 26.0–34.0)
MCHC: 33.3 g/dL (ref 30.0–36.0)
MCV: 84.2 fL (ref 80.0–100.0)
Monocytes Absolute: 0.8 10*3/uL (ref 0.1–1.0)
Monocytes Relative: 5 %
Neutro Abs: 13.3 10*3/uL — ABNORMAL HIGH (ref 1.7–7.7)
Neutrophils Relative %: 87 %
Platelets: 431 10*3/uL — ABNORMAL HIGH (ref 150–400)
RBC: 5.64 MIL/uL — ABNORMAL HIGH (ref 3.87–5.11)
RDW: 14.6 % (ref 11.5–15.5)
WBC: 15.2 10*3/uL — ABNORMAL HIGH (ref 4.0–10.5)
nRBC: 0.2 % (ref 0.0–0.2)

## 2019-04-28 LAB — GLUCOSE, CAPILLARY
Glucose-Capillary: 186 mg/dL — ABNORMAL HIGH (ref 70–99)
Glucose-Capillary: 385 mg/dL — ABNORMAL HIGH (ref 70–99)
Glucose-Capillary: 339 mg/dL — ABNORMAL HIGH (ref 70–99)
Glucose-Capillary: 216 mg/dL — ABNORMAL HIGH (ref 70–99)

## 2019-04-28 LAB — COMPREHENSIVE METABOLIC PANEL
ALT: 58 U/L — ABNORMAL HIGH (ref 0–44)
AST: 28 U/L (ref 15–41)
Albumin: 2.8 g/dL — ABNORMAL LOW (ref 3.5–5.0)
Alkaline Phosphatase: 47 U/L (ref 38–126)
Anion gap: 12 (ref 5–15)
BUN: 54 mg/dL — ABNORMAL HIGH (ref 8–23)
CO2: 26 mmol/L (ref 22–32)
Calcium: 9.6 mg/dL (ref 8.9–10.3)
Chloride: 106 mmol/L (ref 98–111)
Creatinine, Ser: 1.45 mg/dL — ABNORMAL HIGH (ref 0.44–1.00)
GFR calc Af Amer: 37 mL/min — ABNORMAL LOW (ref >60)
GFR calc non Af Amer: 32 mL/min — ABNORMAL LOW (ref >60)
Glucose, Bld: 224 mg/dL — ABNORMAL HIGH (ref 70–99)
Potassium: 4.7 mmol/L (ref 3.5–5.1)
Sodium: 144 mmol/L (ref 135–145)
Total Bilirubin: 0.5 mg/dL (ref 0.3–1.2)
Total Protein: 7.3 g/dL (ref 6.5–8.1)

## 2019-04-28 LAB — FERRITIN: Ferritin: 716 ng/mL — ABNORMAL HIGH (ref 11–307)

## 2019-04-28 LAB — D-DIMER, QUANTITATIVE: D-Dimer, Quant: 1.11 ug/mL-FEU — ABNORMAL HIGH (ref 0.00–0.50)

## 2019-04-28 LAB — C-REACTIVE PROTEIN: CRP: 12.1 mg/dL — ABNORMAL HIGH (ref <1.0)

## 2019-04-28 MED ORDER — MAGNESIUM SULFATE IN D5W 1-5 GM/100ML-% IV SOLN
1.0000 g | Freq: Once | INTRAVENOUS | Status: AC
  Administered 2019-04-28: 1 g via INTRAVENOUS
  Filled 2019-04-28: qty 100

## 2019-04-28 NOTE — Progress Notes (Signed)
PROGRESS NOTE                                                                                                                                                                                                             Patient Demographics:    Mary Trevino, is a 86 y.o. female, DOB - 1927-01-24, HR:[id number]  Outpatient Primary MD for the patient is Debbrah Alar, NP   Admit date - 04/17/2019   LOS - 3  Chief Complaint  Patient presents with  . Shortness of Breath       Brief Narrative: Patient is a 86 y.o. female with PMHx of HTN, HLD, glaucoma, legal blindness, history of corneal transplantation who presented as a transfer from [facility] on 1/13 for evaluation of severe hypoxemia secondary to COVID-19 pneumonia.    She was diagnosed with COVID-19 on 1/5 and was isolating at home.  COVID-19 medications: Steroids: 1/13>> Remdesivir: 1/13>> Actemra: 1/13 x 1   Subjective:   No major events overnight-remains on 15 L.   Assessment  & Plan :   Acute Hypoxic Resp Failure due to Covid 19 Viral pneumonia: Continues to have severe hypoxemia but not in any distress-continue steroids and remdesivir.  Encouraged mobilization-out of bed to chair etc.  Fever: afebrile  O2 requirements:  SpO2: 91 % O2 Flow Rate (L/min): 15 L/min   COVID-19 Labs: Recent Labs    05/11/2019 1318 05/05/2019 1318 04/26/19 0119 04/27/19 0053 04/27/19 1450 04/28/19 0142  DDIMER 1.89*  --   --   --  1.35* 1.11*  FERRITIN 582*   < > 601* 1,017*  --  716*  LDH 380*  --   --   --   --   --   CRP 32.8*   < > 28.9* 20.8*  --  12.1*   < > = values in this interval not displayed.    No results found for: BNP  Recent Labs  Lab 04/17/2019 1318  PROCALCITON 0.41    No results found for: SARSCOV2NAA   Prone/Incentive Spirometry: encouraged patient to lie prone for 3-4 hours at a time for a total of 16 hours a day, and to  encourage incentive spirometry use 3-4/hour.  DVT Prophylaxis  :  Lovenox   Transaminitis: Secondary to Covid--mild-follow.  AKI: Appears to be very mild-suspect hemodynamically mediated-supportive  care for now-encourage oral intake.  Repeat electrolytes tomorrow.  NSVT: We will give magnesium IV x1 today-check magnesium with labs tomorrow.  Continues to be on beta-blocker.  HTN: BP stable-continue amlodipine and metoprolol.  HLD: Continue statin  History of glaucoma/corneal transplantation/legal blindness: On usual home regimen of eyedrops.  Goals of care: DNR in place-plans are to continue with supportive care-to see if patient will improve in the next few days.  I had a long discussion with the patient's daughter on 1/16-while we wait and continue to hope she will improve-family is well aware that if patient deteriorates any further-apart from hospice care-we really will not have any options.  Consults  :  None  Procedures  :  None  ABG: No results found for: PHART, PCO2ART, PO2ART, HCO3, TCO2, ACIDBASEDEF, O2SAT  Vent Settings: N/A  Condition - Stable  Family Communication  : Spoke with the patient's daughter on 1/16.  Code Status :  DNR  Diet :  Diet Order            Diet Heart Room service appropriate? Yes; Fluid consistency: Thin  Diet effective now               Disposition Plan  :  Remain hospitalized-suspect will require SNF on discharge.  Will need to follow clinical trajectory to determine appropriate disposition.  Barriers to discharge: Hypoxia requiring O2 supplementation/complete 5 days of IV Remdesivir  Antimicorbials  :    Anti-infectives (From admission, onward)   Start     Dose/Rate Route Frequency Ordered Stop   04/26/19 1000  remdesivir 100 mg in sodium chloride 0.9 % 100 mL IVPB     100 mg 200 mL/hr over 30 Minutes Intravenous Daily 04/30/2019 1500 04/30/19 0959   04/17/2019 1500  remdesivir 100 mg in sodium chloride 0.9 % 100 mL IVPB     100  mg 200 mL/hr over 30 Minutes Intravenous Every 30 min 04/17/2019 1451 05/08/2019 1614   04/13/2019 1345  cefTRIAXone (ROCEPHIN) 1 g in sodium chloride 0.9 % 100 mL IVPB     1 g 200 mL/hr over 30 Minutes Intravenous  Once 04/19/2019 1343 05/12/2019 1450   04/27/2019 1345  azithromycin (ZITHROMAX) 500 mg in sodium chloride 0.9 % 250 mL IVPB     500 mg 250 mL/hr over 60 Minutes Intravenous  Once 05/12/2019 1343 04/26/2019 1514      Inpatient Medications  Scheduled Meds: . amLODipine  5 mg Oral Daily  . vitamin C  500 mg Oral Daily  . atorvastatin  10 mg Oral Daily  . brimonidine  1 drop Both Eyes BID   And  . timolol  1 drop Both Eyes BID  . enoxaparin (LOVENOX) injection  40 mg Subcutaneous Q24H  . escitalopram  10 mg Oral Daily  . insulin aspart  0-9 Units Subcutaneous TID WC  . loteprednol  1 drop Right Eye QID  . methylPREDNISolone (SOLU-MEDROL) injection  40 mg Intravenous Q12H  . metoprolol succinate  25 mg Oral Daily  . nortriptyline  20 mg Oral QHS  . pantoprazole  40 mg Oral Daily  . prednisoLONE acetate  1 drop Left Eye BID  . sodium chloride flush  3 mL Intravenous Q12H  . zinc sulfate  220 mg Oral Daily   Continuous Infusions: . magnesium sulfate bolus IVPB    . remdesivir 100 mg in NS 100 mL 100 mg (04/28/19 0900)   PRN Meds:.acetaminophen, albuterol, chlorpheniramine-HYDROcodone, guaiFENesin-dextromethorphan, loperamide, ondansetron **OR** ondansetron (ZOFRAN) IV, polyethylene  glycol   Time Spent in minutes 35   See all Orders from today for further details   Oren Binet M.D on 04/28/2019 at 12:29 PM  To page go to [email or web address] - use universal password  [facility] -  Office  [phone]    Objective:   Vitals:   04/28/19 0052 04/28/19 0400 04/28/19 0800 04/28/19 1138  BP:  129/79 (!) 129/99 136/87  Pulse:  89 99 94  Resp: 20 19 (!) 23 (!) 27  Temp:  (!) 97.1 F (36.2 C) 97.9 F (36.6 C) 97.9 F (36.6 C)  TempSrc:  Axillary Oral Oral  SpO2:  96%  (!) 89% 91%  Weight:      Height:        Wt Readings from Last 3 Encounters:  04/29/2019 61.3 kg  04/17/19 63.5 kg  08/09/18 65 kg     Intake/Output Summary (Last 24 hours) at 04/28/2019 1229 Last data filed at 04/27/2019 1700 Gross per 24 hour  Intake 360 ml  Output --  Net 360 ml     Physical Exam Gen Exam:Alert awake-not in any distress-looks frail and weak. HEENT:atraumatic, normocephalic Chest: B/L clear to auscultation anteriorly CVS:S1S2 regular Abdomen:soft non tender, non distended Extremities:no edema Neurology: Non focal Skin: no rash   Data Review:    CBC Recent Labs  Lab 05/05/2019 1318 05/12/2019 1800 04/26/19 0119 04/27/19 0053 04/28/19 0142  WBC 11.0* 11.0* 9.3 13.2* 15.2*  HGB 14.7 14.5 13.6 14.7 15.8*  HCT 45.0 43.4 40.8 44.2 47.5*  PLT 275 244 267 350 431*  MCV 84.9 84.6 84.1 84.0 84.2  MCH 27.7 28.3 28.0 27.9 28.0  MCHC 32.7 33.4 33.3 33.3 33.3  RDW 14.2 14.4 14.4 14.5 14.6  LYMPHSABS 0.8  --  0.8 0.8 0.6*  MONOABS 0.6  --  0.3 0.8 0.8  EOSABS 0.0  --  0.0 0.0 0.0  BASOSABS 0.0  --  0.0 0.0 0.1    Chemistries  Recent Labs  Lab 04/19/2019 1318 04/19/2019 1800 04/26/19 0119 04/27/19 0053 04/28/19 0142  NA 140  --  139 142 144  K 4.2  --  3.9 4.0 4.7  CL 101  --  105 106 106  CO2 28  --  22 23 26   GLUCOSE 157*  --  187* 171* 224*  BUN 21  --  27* 51* 54*  CREATININE 0.99 0.98 1.02* 1.21* 1.45*  CALCIUM 9.9  --  8.6* 9.3 9.6  AST 55*  --  73* 48* 28  ALT 52*  --  78* 73* 58*  ALKPHOS 38  --  35* 39 47  BILITOT 0.5  --  0.5 0.3 0.5   ------------------------------------------------------------------------------------------------------------------ Recent Labs    04/24/2019 1318  TRIG 128    Lab Results  Component Value Date   HGBA1C 6.1 (H) 04/26/2019   ------------------------------------------------------------------------------------------------------------------ No results for input(s): TSH, T4TOTAL, T3FREE, THYROIDAB in the  last 72 hours.  Invalid input(s): FREET3 ------------------------------------------------------------------------------------------------------------------ Recent Labs    04/27/19 0053 04/28/19 0142  FERRITIN 1,017* 716*    Coagulation profile No results for input(s): INR, PROTIME in the last 168 hours.  Recent Labs    04/27/19 1450 04/28/19 0142  DDIMER 1.35* 1.11*    Cardiac Enzymes No results for input(s): CKMB, TROPONINI, MYOGLOBIN in the last 168 hours.  Invalid input(s): CK ------------------------------------------------------------------------------------------------------------------ No results found for: BNP  Micro Results Recent Results (from the past 240 hour(s))  Blood Culture (routine x 2)     Status: None (Preliminary  result)   Collection Time: 05/12/2019  1:03 PM   Specimen: Right Antecubital; Blood  Result Value Ref Range Status   Specimen Description   Final    RIGHT ANTECUBITAL Performed at [facility], [address] [place], [place] [address]    Special Requests   Final    BOTTLES DRAWN AEROBIC AND ANAEROBIC Blood Culture results may not be optimal due to an inadequate volume of blood received in culture bottles Performed at [facility], [address] [place], [place] [address]    Culture   Final    NO GROWTH 3 DAYS Performed at [facility], [place] [address] [place], [place] [address]    Report Status PENDING  Incomplete  Blood Culture (routine x 2)     Status: None (Preliminary result)   Collection Time: 04/24/2019  1:18 PM   Specimen: BLOOD LEFT HAND  Result Value Ref Range Status   Specimen Description   Final    BLOOD LEFT HAND Performed at [facility], [address] [place], [place] [address]    Special Requests   Final    BOTTLES DRAWN AEROBIC AND ANAEROBIC Blood Culture results may not be optimal due to an inadequate volume of blood received in culture bottles Performed at [facility], [address] [place], [place] [address]    Culture   Final    NO GROWTH 3 DAYS Performed at [facility], [place] [address] [place], [place] [address]    Report Status PENDING  Incomplete  C difficile quick scan w PCR reflex     Status: None   Collection Time: 04/13/2019 10:38 PM   Specimen: STOOL  Result Value Ref Range Status   C Diff antigen NEGATIVE NEGATIVE Final   C Diff toxin NEGATIVE NEGATIVE Final   C Diff interpretation No C. difficile detected.  Final    Comment: Performed at [facility], [place] [address] [place], [place] [address]    Radiology Reports DG Chest [place] 1 View  Result Date: 05/07/2019 CLINICAL DATA:  Brought in by EMS from home for SOB , COVID+, cough x 2 days EXAM: PORTABLE CHEST 1 VIEW COMPARISON:  04/17/2019 FINDINGS: There hazy airspace lung opacities predominantly in the mid to lower lungs, new or increased when compared to the prior exam. No convincing pleural effusion.  No pneumothorax. Cardiac silhouette is normal in size. No mediastinal or hilar masses. Skeletal structures are grossly intact. IMPRESSION: 1. Bilateral hazy airspace lung opacities have developed since prior study consistent multifocal pneumonia due to COVID-19 infection. Electronically Signed   By: Lajean Manes M.D.   On: 05/04/2019 13:39   DG Chest Portable 1 View  Result Date: 04/17/2019 CLINICAL DATA:  Cough and shortness of breath.  Hypertension. EXAM: PORTABLE CHEST 1 VIEW COMPARISON:  April 01, 2017 FINDINGS: There is no appreciable edema or consolidation. Heart is mildly enlarged with pulmonary vascularity normal. No adenopathy. Mild prominence of the aorta is stable. There is aortic atherosclerosis. There is degenerative change in the thoracic spine. No adenopathy evident. IMPRESSION: No edema or consolidation. Stable cardiac prominence. Prominence of the aorta may reflect chronic hypertension and appears stable. Aortic Atherosclerosis (ICD10-I70.0).  Electronically Signed   By: Lowella Grip III M.D.   On: 04/17/2019 13:02

## 2019-04-28 NOTE — Progress Notes (Signed)
Pt refused Alphagan and Timoptic eyedrops stating that they both burned her eyes. Pt states she takes Combigan at home and prefers that. Explained to pt that these two eyedrops are a substitute for her Combigan because the hospital does not carry it. Pt still refused med. No other issues at this time.

## 2019-04-28 NOTE — Progress Notes (Signed)
Notified Dr. Sloan Leiter concerning short non-sustaining 8beat run of VTach this morning. No new orders obtained. Pt resting and asymptomatic. No other issues at this time.

## 2019-04-29 DIAGNOSIS — J9601 Acute respiratory failure with hypoxia: Secondary | ICD-10-CM

## 2019-04-29 LAB — COMPREHENSIVE METABOLIC PANEL
ALT: 50 U/L — ABNORMAL HIGH (ref 0–44)
AST: 25 U/L (ref 15–41)
Albumin: 2.9 g/dL — ABNORMAL LOW (ref 3.5–5.0)
Alkaline Phosphatase: 52 U/L (ref 38–126)
Anion gap: 12 (ref 5–15)
BUN: 51 mg/dL — ABNORMAL HIGH (ref 8–23)
CO2: 28 mmol/L (ref 22–32)
Calcium: 9.8 mg/dL (ref 8.9–10.3)
Chloride: 106 mmol/L (ref 98–111)
Creatinine, Ser: 1.28 mg/dL — ABNORMAL HIGH (ref 0.44–1.00)
GFR calc Af Amer: 43 mL/min — ABNORMAL LOW (ref >60)
GFR calc non Af Amer: 37 mL/min — ABNORMAL LOW (ref >60)
Glucose, Bld: 159 mg/dL — ABNORMAL HIGH (ref 70–99)
Potassium: 4.9 mmol/L (ref 3.5–5.1)
Sodium: 146 mmol/L — ABNORMAL HIGH (ref 135–145)
Total Bilirubin: 0.5 mg/dL (ref 0.3–1.2)
Total Protein: 7 g/dL (ref 6.5–8.1)

## 2019-04-29 LAB — CBC WITH DIFFERENTIAL/PLATELET
Abs Immature Granulocytes: 0.83 10*3/uL — ABNORMAL HIGH (ref 0.00–0.07)
Basophils Absolute: 0.1 10*3/uL (ref 0.0–0.1)
Basophils Relative: 1 %
Eosinophils Absolute: 0 10*3/uL (ref 0.0–0.5)
Eosinophils Relative: 0 %
HCT: 47.9 % — ABNORMAL HIGH (ref 36.0–46.0)
Hemoglobin: 15.6 g/dL — ABNORMAL HIGH (ref 12.0–15.0)
Immature Granulocytes: 5 %
Lymphocytes Relative: 3 %
Lymphs Abs: 0.6 10*3/uL — ABNORMAL LOW (ref 0.7–4.0)
MCH: 27.9 pg (ref 26.0–34.0)
MCHC: 32.6 g/dL (ref 30.0–36.0)
MCV: 85.7 fL (ref 80.0–100.0)
Monocytes Absolute: 0.9 10*3/uL (ref 0.1–1.0)
Monocytes Relative: 5 %
Neutro Abs: 14.8 10*3/uL — ABNORMAL HIGH (ref 1.7–7.7)
Neutrophils Relative %: 86 %
Platelets: 393 10*3/uL (ref 150–400)
RBC: 5.59 MIL/uL — ABNORMAL HIGH (ref 3.87–5.11)
RDW: 14.6 % (ref 11.5–15.5)
WBC: 17.2 10*3/uL — ABNORMAL HIGH (ref 4.0–10.5)
nRBC: 0 % (ref 0.0–0.2)

## 2019-04-29 LAB — GLUCOSE, CAPILLARY
Glucose-Capillary: 174 mg/dL — ABNORMAL HIGH (ref 70–99)
Glucose-Capillary: 286 mg/dL — ABNORMAL HIGH (ref 70–99)
Glucose-Capillary: 273 mg/dL — ABNORMAL HIGH (ref 70–99)
Glucose-Capillary: 169 mg/dL — ABNORMAL HIGH (ref 70–99)

## 2019-04-29 LAB — FERRITIN: Ferritin: 691 ng/mL — ABNORMAL HIGH (ref 11–307)

## 2019-04-29 LAB — MAGNESIUM: Magnesium: 2.9 mg/dL — ABNORMAL HIGH (ref 1.7–2.4)

## 2019-04-29 LAB — D-DIMER, QUANTITATIVE: D-Dimer, Quant: 1.4 ug/mL-FEU — ABNORMAL HIGH (ref 0.00–0.50)

## 2019-04-29 LAB — C-REACTIVE PROTEIN: CRP: 6.7 mg/dL — ABNORMAL HIGH (ref <1.0)

## 2019-04-29 MED ORDER — DEXTROSE 5 % IV SOLN: INTRAVENOUS | Status: AC

## 2019-04-29 MED ORDER — INSULIN ASPART 100 UNIT/ML ~~LOC~~ SOLN
0.0000 [IU] | Freq: Three times a day (TID) | SUBCUTANEOUS | Status: DC
  Administered 2019-04-29 (×2): 8 [IU] via SUBCUTANEOUS
  Administered 2019-04-29: 08:00:00 3 [IU] via SUBCUTANEOUS
  Administered 2019-04-30: 13:00:00 2 [IU] via SUBCUTANEOUS
  Administered 2019-04-30: 09:00:00 3 [IU] via SUBCUTANEOUS
  Administered 2019-04-30: 2 [IU] via SUBCUTANEOUS
  Administered 2019-05-01 (×2): 3 [IU] via SUBCUTANEOUS
  Administered 2019-05-02 (×2): 2 [IU] via SUBCUTANEOUS
  Administered 2019-05-02: 3 [IU] via SUBCUTANEOUS
  Administered 2019-05-03: 09:00:00 2 [IU] via SUBCUTANEOUS
  Administered 2019-05-04: 08:00:00 3 [IU] via SUBCUTANEOUS
  Administered 2019-05-04 (×2): 5 [IU] via SUBCUTANEOUS
  Administered 2019-05-05: 3 [IU] via SUBCUTANEOUS
  Administered 2019-05-05: 08:00:00 2 [IU] via SUBCUTANEOUS

## 2019-04-29 MED ORDER — INSULIN ASPART 100 UNIT/ML ~~LOC~~ SOLN
2.0000 [IU] | Freq: Three times a day (TID) | SUBCUTANEOUS | Status: DC
  Administered 2019-04-29 – 2019-05-04 (×13): 2 [IU] via SUBCUTANEOUS

## 2019-04-29 MED ORDER — METOPROLOL SUCCINATE ER 25 MG PO TB24
50.0000 mg | ORAL_TABLET | Freq: Every day | ORAL | Status: DC
  Administered 2019-04-29 – 2019-05-03 (×4): 50 mg via ORAL
  Filled 2019-04-29 (×5): qty 2

## 2019-04-29 MED ORDER — ENOXAPARIN SODIUM 30 MG/0.3ML ~~LOC~~ SOLN
30.0000 mg | SUBCUTANEOUS | Status: DC
  Administered 2019-04-29 – 2019-04-30 (×2): 30 mg via SUBCUTANEOUS
  Filled 2019-04-29 (×2): qty 0.3

## 2019-04-29 MED ORDER — INSULIN ASPART 100 UNIT/ML ~~LOC~~ SOLN
0.0000 [IU] | Freq: Every day | SUBCUTANEOUS | Status: DC
  Administered 2019-05-03: 3 [IU] via SUBCUTANEOUS
  Administered 2019-05-04: 22:00:00 2 [IU] via SUBCUTANEOUS

## 2019-04-29 MED ORDER — METHYLPREDNISOLONE SODIUM SUCC 40 MG IJ SOLR
40.0000 mg | Freq: Every day | INTRAMUSCULAR | Status: DC
  Administered 2019-04-30: 11:00:00 40 mg via INTRAVENOUS
  Filled 2019-04-29: qty 1

## 2019-04-29 NOTE — Progress Notes (Signed)
PROGRESS NOTE                                                                                                                                                                                                             Patient Demographics:    Mary Trevino, is a 86 y.o. female, DOB - 08-02-1926, HYQ:[id number]  Outpatient Primary MD for the patient is Debbrah Alar, NP   Admit date - 05/05/2019   LOS - 4  Chief Complaint  Patient presents with  . Shortness of Breath       Brief Narrative: Patient is a 86 y.o. female with PMHx of HTN, HLD, glaucoma, legal blindness, history of corneal transplantation who presented as a transfer from [facility] on 1/13 for evaluation of severe hypoxemia secondary to COVID-19 pneumonia.    She was diagnosed with COVID-19 on 1/5 and was isolating at home.  COVID-19 medications: Steroids: 1/13>> Remdesivir: 1/13>> Actemra: 1/13 x 1   Subjective:   Seen in bed comfortable, pulse oximeter wide positional, in no distress denies any headache chest or abdominal pain, able to titrate down oxygen from 15L to 8 L.   Assessment  & Plan :   Acute Hypoxic Resp Failure due to Covid 19 Viral pneumonia: She severe disease and was treated with full scope of treatment with IV steroids, Actemra and remdesivir.  Her pulse oximeter seems to be extremely positional, she was on 15 L nasal cannula oxygen but in no distress, pulse oximeter was realigned with down titration of oxygen to about 8 L.  Patient in no distress.  Continue to monitor.  Gradually taper oxygen and steroids as tolerated, advance activity.  Encouraged to sit up in chair in the daytime use I-S and flutter valve for pulmonary toiletry and prone in bed at night.  Fever: afebrile  O2 requirements:  SpO2: 94 % O2 Flow Rate (L/min): 8 L/min   COVID-19 Labs: Recent Labs    04/27/19 0053 04/27/19 1450 04/28/19 0142  04/29/19 0120  DDIMER  --  1.35* 1.11* 1.40*  FERRITIN 1,017*  --  716* 691*  CRP 20.8*  --  12.1* 6.7*    No results found for: BNP  Recent Labs  Lab 04/15/2019 1318  PROCALCITON 0.41    No results found for: [place]   Hepatic Function Latest Ref Rng &  Units 04/29/2019 04/28/2019 04/27/2019  Total Protein 6.5 - 8.1 g/dL 7.0 7.3 7.1  Albumin 3.5 - 5.0 g/dL 2.9(L) 2.8(L) 2.8(L)  AST 15 - 41 U/L 25 28 48(H)  ALT 0 - 44 U/L 50(H) 58(H) 73(H)  Alk Phosphatase 38 - 126 U/L 52 47 39  Total Bilirubin 0.3 - 1.2 mg/dL 0.5 0.5 0.3  Bilirubin, Direct 0.0 - 0.3 mg/dL - - -    Prone/Incentive Spirometry: encouraged patient to lie prone for 3-4 hours at a time for a total of 16 hours a day, and to encourage incentive spirometry use 3-4/hour.  Transaminitis: Mild asymptomatic COVID-19 viral infection related transaminitis, stable trend.  AKI: Appears to be very mild-suspect hemodynamically mediated-supportive care for now-encourage oral intake.  Repeat electrolytes tomorrow.  NSVT: Dramatic with stable magnesium, doubled beta-blocker.  65 to 70% in mid 2017 , no further work-up.  HTN: BP stable, Norvasc discontinued to double beta-blocker for above.  HLD: Continue statin  History of glaucoma/corneal transplantation/legal blindness: On usual home regimen of eyedrops.  Mild dehydration with hypernatremia.  Gentle IV D5W.    Goals of care: DNR in place-plans are to continue with supportive care-to see if patient will improve in the next few days.  I had a long discussion with the patient's daughter on 1/16-while we wait and continue to hope she will improve-family is well aware that if patient deteriorates any further-apart from hospice care-we really will not have any options.  Consults  :  None  Procedures  :  None   Condition - Stable  Family Communication  : Spoke with the patient's daughter on 1/17  Code Status :  DNR  Diet :  Diet Order            Diet Heart Room  service appropriate? Yes; Fluid consistency: Thin  Diet effective now               Disposition Plan  :  Remain hospitalized-suspect will require SNF on discharge.  Will need to follow clinical trajectory to determine appropriate disposition.  Barriers to discharge: Hypoxia requiring O2 supplementation/complete 5 days of IV Remdesivir  Antimicorbials  :    Anti-infectives (From admission, onward)   Start     Dose/Rate Route Frequency Ordered Stop   04/26/19 1000  remdesivir 100 mg in sodium chloride 0.9 % 100 mL IVPB     100 mg 200 mL/hr over 30 Minutes Intravenous Daily 05/11/2019 1500 04/29/19 0854   05/03/2019 1500  remdesivir 100 mg in sodium chloride 0.9 % 100 mL IVPB     100 mg 200 mL/hr over 30 Minutes Intravenous Every 30 min 05/07/2019 1451 04/17/2019 1614   05/05/2019 1345  cefTRIAXone (ROCEPHIN) 1 g in sodium chloride 0.9 % 100 mL IVPB     1 g 200 mL/hr over 30 Minutes Intravenous  Once 04/27/2019 1343 04/22/2019 1450   04/15/2019 1345  azithromycin (ZITHROMAX) 500 mg in sodium chloride 0.9 % 250 mL IVPB     500 mg 250 mL/hr over 60 Minutes Intravenous  Once 05/07/2019 1343 04/27/2019 1514     DVT Prophylaxis  :  Lovenox   Inpatient Medications  Scheduled Meds: . vitamin C  500 mg Oral Daily  . atorvastatin  10 mg Oral Daily  . brimonidine  1 drop Both Eyes BID   And  . timolol  1 drop Both Eyes BID  . escitalopram  10 mg Oral Daily  . insulin aspart  0-15 Units Subcutaneous TID WC  .  insulin aspart  0-5 Units Subcutaneous QHS  . insulin aspart  2 Units Subcutaneous TID WC  . loteprednol  1 drop Right Eye QID  . [START ON 04/30/2019] methylPREDNISolone (SOLU-MEDROL) injection  40 mg Intravenous Daily  . metoprolol succinate  50 mg Oral Daily  . nortriptyline  20 mg Oral QHS  . pantoprazole  40 mg Oral Daily  . prednisoLONE acetate  1 drop Left Eye BID  . sodium chloride flush  3 mL Intravenous Q12H  . zinc sulfate  220 mg Oral Daily   Continuous Infusions: . dextrose 100  mL/hr at 04/29/19 0826   PRN Meds:.acetaminophen, albuterol, chlorpheniramine-HYDROcodone, guaiFENesin-dextromethorphan, loperamide, [DISCONTINUED] ondansetron **OR** ondansetron (ZOFRAN) IV, polyethylene glycol   Time Spent in minutes 35   See all Orders from today for further details   Lala Lund M.D on 04/29/2019 at 10:32 AM  To page go to [email or web address] - use universal password  [facility] -  Office  [phone]    Objective:   Vitals:   04/29/19 0815 04/29/19 0830 04/29/19 0845 04/29/19 0900  BP:      Pulse:  (!) 102    Resp:  (!) 38 (!) 22 (!) 30  Temp:      TempSrc:      SpO2: 90% (!) 80% (!) 78% 94%  Weight:      Height:        Wt Readings from Last 3 Encounters:  04/24/2019 61.3 kg  04/17/19 63.5 kg  08/09/18 65 kg     Intake/Output Summary (Last 24 hours) at 04/29/2019 1032 Last data filed at 04/29/2019 0600 Gross per 24 hour  Intake 650 ml  Output --  Net 650 ml     Physical Exam  Awake, mildly confused but in no distress, moves all 4 extremities to commands, No new F.N deficits,   [place].AT,PERRAL Supple Neck,No JVD, No cervical lymphadenopathy appriciated.  Symmetrical Chest wall movement, Good air movement bilaterally, CTAB RRR,No Gallops, Rubs or new Murmurs, No Parasternal Heave +ve B.Sounds, Abd Soft, No tenderness, No organomegaly appriciated, No rebound - guarding or rigidity. No Cyanosis, Clubbing or edema, No new Rash or bruise    Data Review:    CBC Recent Labs  Lab 05/09/2019 1318 05/10/2019 1318 04/29/2019 1800 04/26/19 0119 04/27/19 0053 04/28/19 0142 04/29/19 0120  WBC 11.0*   < > 11.0* 9.3 13.2* 15.2* 17.2*  HGB 14.7   < > 14.5 13.6 14.7 15.8* 15.6*  HCT 45.0   < > 43.4 40.8 44.2 47.5* 47.9*  PLT 275   < > 244 267 350 431* 393  MCV 84.9   < > 84.6 84.1 84.0 84.2 85.7  MCH 27.7   < > 28.3 28.0 27.9 28.0 27.9  MCHC 32.7   < > 33.4 33.3 33.3 33.3 32.6  RDW 14.2   < > 14.4 14.4 14.5 14.6 14.6  LYMPHSABS 0.8  --   --   0.8 0.8 0.6* 0.6*  MONOABS 0.6  --   --  0.3 0.8 0.8 0.9  EOSABS 0.0  --   --  0.0 0.0 0.0 0.0  BASOSABS 0.0  --   --  0.0 0.0 0.1 0.1   < > = values in this interval not displayed.    Chemistries  Recent Labs  Lab 04/20/2019 1318 04/15/2019 1318 04/22/2019 1800 04/26/19 0119 04/27/19 0053 04/28/19 0142 04/29/19 0120  NA 140  --   --  139 142 144 146*  K 4.2  --   --  3.9 4.0 4.7 4.9  CL 101  --   --  105 106 106 106  CO2 28  --   --  '22 23 26 28  '$ GLUCOSE 157*  --   --  187* 171* 224* 159*  BUN 21  --   --  27* 51* 54* 51*  CREATININE 0.99   < > 0.98 1.02* 1.21* 1.45* 1.28*  CALCIUM 9.9  --   --  8.6* 9.3 9.6 9.8  MG  --   --   --   --   --   --  2.9*  AST 55*  --   --  73* 48* 28 25  ALT 52*  --   --  78* 73* 58* 50*  ALKPHOS 38  --   --  35* 39 47 52  BILITOT 0.5  --   --  0.5 0.3 0.5 0.5   < > = values in this interval not displayed.   ------------------------------------------------------------------------------------------------------------------ No results for input(s): CHOL, HDL, LDLCALC, TRIG, CHOLHDL, LDLDIRECT in the last 72 hours.  Lab Results  Component Value Date   HGBA1C 6.1 (H) 04/26/2019   ------------------------------------------------------------------------------------------------------------------ No results for input(s): TSH, T4TOTAL, T3FREE, THYROIDAB in the last 72 hours.  Invalid input(s): FREET3 ------------------------------------------------------------------------------------------------------------------ Recent Labs    04/28/19 0142 04/29/19 0120  FERRITIN 716* 691*    Coagulation profile No results for input(s): INR, PROTIME in the last 168 hours.  Recent Labs    04/28/19 0142 04/29/19 0120  DDIMER 1.11* 1.40*    Cardiac Enzymes No results for input(s): CKMB, TROPONINI, MYOGLOBIN in the last 168 hours.  Invalid input(s):  CK ------------------------------------------------------------------------------------------------------------------ No results found for: BNP  Micro Results Recent Results (from the past 240 hour(s))  Blood Culture (routine x 2)     Status: None (Preliminary result)   Collection Time: 04/21/2019  1:03 PM   Specimen: Right Antecubital; Blood  Result Value Ref Range Status   Specimen Description   Final    RIGHT ANTECUBITAL Performed at [facility], [address] [place], [place] [address]    Special Requests   Final    BOTTLES DRAWN AEROBIC AND ANAEROBIC Blood Culture results may not be optimal due to an inadequate volume of blood received in culture bottles Performed at [facility], [address] [place], [place] [address]    Culture   Final    NO GROWTH 4 DAYS Performed at [facility], [place] [address] [place], [place] [address]    Report Status PENDING  Incomplete  Blood Culture (routine x 2)     Status: None (Preliminary result)   Collection Time: 04/22/2019  1:18 PM   Specimen: BLOOD LEFT HAND  Result Value Ref Range Status   Specimen Description   Final    BLOOD LEFT HAND Performed at [facility], [address] [place], [place] [address]    Special Requests   Final    BOTTLES DRAWN AEROBIC AND ANAEROBIC Blood Culture results may not be optimal due to an inadequate volume of blood received in culture bottles Performed at [facility], [address] [place], [place] [address]    Culture   Final    NO GROWTH 4 DAYS Performed at [facility], [place] [address] [place], [place] [address]    Report Status PENDING  Incomplete  C difficile quick scan w PCR reflex     Status: None  Collection Time: 05/01/2019 10:38 PM   Specimen: STOOL  Result Value Ref Range Status   C Diff antigen NEGATIVE NEGATIVE Final   C Diff toxin NEGATIVE NEGATIVE Final   C Diff interpretation No C. difficile detected.  Final    Comment:  Performed at [facility], [place] [address], [place], [place] [address]    Radiology Reports DG Chest [place] 1 View  Result Date: 04/29/2019 CLINICAL DATA:  Brought in by EMS from home for SOB , COVID+, cough x 2 days EXAM: PORTABLE CHEST 1 VIEW COMPARISON:  04/17/2019 FINDINGS: There hazy airspace lung opacities predominantly in the mid to lower lungs, new or increased when compared to the prior exam. No convincing pleural effusion.  No pneumothorax. Cardiac silhouette is normal in size. No mediastinal or hilar masses. Skeletal structures are grossly intact. IMPRESSION: 1. Bilateral hazy airspace lung opacities have developed since prior study consistent multifocal pneumonia due to COVID-19 infection. Electronically Signed   By: Lajean Manes M.D.   On: 04/18/2019 13:39   DG Chest Portable 1 View  Result Date: 04/17/2019 CLINICAL DATA:  Cough and shortness of breath.  Hypertension. EXAM: PORTABLE CHEST 1 VIEW COMPARISON:  April 01, 2017 FINDINGS: There is no appreciable edema or consolidation. Heart is mildly enlarged with pulmonary vascularity normal. No adenopathy. Mild prominence of the aorta is stable. There is aortic atherosclerosis. There is degenerative change in the thoracic spine. No adenopathy evident. IMPRESSION: No edema or consolidation. Stable cardiac prominence. Prominence of the aorta may reflect chronic hypertension and appears stable. Aortic Atherosclerosis (ICD10-I70.0). Electronically Signed   By: Lowella Grip III M.D.   On: 04/17/2019 13:02

## 2019-04-30 LAB — CBC WITH DIFFERENTIAL/PLATELET
Abs Immature Granulocytes: 1.13 10*3/uL — ABNORMAL HIGH (ref 0.00–0.07)
Basophils Absolute: 0.2 10*3/uL — ABNORMAL HIGH (ref 0.0–0.1)
Basophils Relative: 1 %
Eosinophils Absolute: 0 10*3/uL (ref 0.0–0.5)
Eosinophils Relative: 0 %
HCT: 46.3 % — ABNORMAL HIGH (ref 36.0–46.0)
Hemoglobin: 14.7 g/dL (ref 12.0–15.0)
Immature Granulocytes: 6 %
Lymphocytes Relative: 4 %
Lymphs Abs: 0.8 10*3/uL (ref 0.7–4.0)
MCH: 27 pg (ref 26.0–34.0)
MCHC: 31.7 g/dL (ref 30.0–36.0)
MCV: 85 fL (ref 80.0–100.0)
Monocytes Absolute: 1.2 10*3/uL — ABNORMAL HIGH (ref 0.1–1.0)
Monocytes Relative: 6 %
Neutro Abs: 15.4 10*3/uL — ABNORMAL HIGH (ref 1.7–7.7)
Neutrophils Relative %: 83 %
Platelets: 372 10*3/uL (ref 150–400)
RBC: 5.45 MIL/uL — ABNORMAL HIGH (ref 3.87–5.11)
RDW: 14.6 % (ref 11.5–15.5)
WBC: 18.6 10*3/uL — ABNORMAL HIGH (ref 4.0–10.5)
nRBC: 0 % (ref 0.0–0.2)

## 2019-04-30 LAB — COMPREHENSIVE METABOLIC PANEL
ALT: 43 U/L (ref 0–44)
AST: 30 U/L (ref 15–41)
Albumin: 2.6 g/dL — ABNORMAL LOW (ref 3.5–5.0)
Alkaline Phosphatase: 43 U/L (ref 38–126)
Anion gap: 10 (ref 5–15)
BUN: 40 mg/dL — ABNORMAL HIGH (ref 8–23)
CO2: 25 mmol/L (ref 22–32)
Calcium: 9.2 mg/dL (ref 8.9–10.3)
Chloride: 105 mmol/L (ref 98–111)
Creatinine, Ser: 1 mg/dL (ref 0.44–1.00)
GFR calc Af Amer: 57 mL/min — ABNORMAL LOW (ref >60)
GFR calc non Af Amer: 50 mL/min — ABNORMAL LOW (ref >60)
Glucose, Bld: 115 mg/dL — ABNORMAL HIGH (ref 70–99)
Potassium: 4.5 mmol/L (ref 3.5–5.1)
Sodium: 140 mmol/L (ref 135–145)
Total Bilirubin: 0.5 mg/dL (ref 0.3–1.2)
Total Protein: 6.3 g/dL — ABNORMAL LOW (ref 6.5–8.1)

## 2019-04-30 LAB — CULTURE, BLOOD (ROUTINE X 2)
Culture: NO GROWTH
Culture: NO GROWTH

## 2019-04-30 LAB — GLUCOSE, CAPILLARY
Glucose-Capillary: 165 mg/dL — ABNORMAL HIGH (ref 70–99)
Glucose-Capillary: 136 mg/dL — ABNORMAL HIGH (ref 70–99)
Glucose-Capillary: 143 mg/dL — ABNORMAL HIGH (ref 70–99)
Glucose-Capillary: 131 mg/dL — ABNORMAL HIGH (ref 70–99)

## 2019-04-30 LAB — D-DIMER, QUANTITATIVE: D-Dimer, Quant: 1.63 ug/mL-FEU — ABNORMAL HIGH (ref 0.00–0.50)

## 2019-04-30 LAB — MAGNESIUM: Magnesium: 2.4 mg/dL (ref 1.7–2.4)

## 2019-04-30 LAB — BRAIN NATRIURETIC PEPTIDE: B Natriuretic Peptide: 77.4 pg/mL (ref 0.0–100.0)

## 2019-04-30 LAB — C-REACTIVE PROTEIN: CRP: 3.2 mg/dL — ABNORMAL HIGH (ref <1.0)

## 2019-04-30 MED ORDER — METHYLPREDNISOLONE SODIUM SUCC 40 MG IJ SOLR
30.0000 mg | Freq: Every day | INTRAMUSCULAR | Status: DC
  Administered 2019-05-01 – 2019-05-03 (×3): 30 mg via INTRAVENOUS
  Filled 2019-04-30 (×2): qty 1

## 2019-04-30 NOTE — Progress Notes (Signed)
Pt appetite poor. Refusing most of meals. Eating <10%.

## 2019-04-30 NOTE — Progress Notes (Signed)
PROGRESS NOTE                                                                                                                                                                                                             Patient Demographics:    Mary Trevino, is a 86 y.o. female, DOB - 1926-07-27, KMM:[id number]  Outpatient Primary MD for the patient is Debbrah Alar, NP   Admit date - 05/02/2019   LOS - 5  Chief Complaint  Patient presents with  . Shortness of Breath       Brief Narrative: Patient is a 86 y.o. female with PMHx of HTN, HLD, glaucoma, legal blindness, history of corneal transplantation who presented as a transfer from [facility] on 1/13 for evaluation of severe hypoxemia secondary to COVID-19 pneumonia.    She was diagnosed with COVID-19 on 1/5 and was isolating at home.  COVID-19 medications: Steroids: 1/13>> Remdesivir: 1/13>> Actemra: 1/13 x 1   Subjective:   Patient in bed, appears comfortable, denies any headache, no fever, no chest pain or pressure, improved shortness of breath , no abdominal pain. No focal weakness.   Assessment  & Plan :   Acute Hypoxic Resp Failure due to Covid 19 Viral pneumonia: She severe disease and was treated with full scope of treatment with IV steroids, Actemra and remdesivir.  Her pulse oximeter seems to be extremely positional, she was on 15 L nasal cannula oxygen but in no distress, pulse oximeter was realigned on 04/30/19 with down titration of oxygen to about 8 L.  This happened again on 04/30/2019, staff again educated, requested the nurse to place the patient in chair, replace pulse oximeter probe and then to update her oxygen requirements as appropriate.  We will continue to gradually taper oxygen and steroids as tolerated, advance activity.    Encouraged to sit up in chair in the daytime use I-S and flutter valve for pulmonary toiletry and prone in  bed at night.  Fever: afebrile  O2 requirements:    COVID-19 Labs: Recent Labs    04/28/19 0142 04/29/19 0120 04/30/19 0156 04/30/19 0500  DDIMER 1.11* 1.40* 1.63*  --   FERRITIN 716* 691*  --   --   CRP 12.1* 6.7*  --  3.2*       Component Value Date/Time   BNP  77.4 04/30/2019 0156    Recent Labs  Lab 05/07/2019 1318  PROCALCITON 0.41    No results found for: SARSCOV2NAA   Hepatic Function Latest Ref Rng & Units 04/30/2019 04/29/2019 04/28/2019  Total Protein 6.5 - 8.1 g/dL 6.3(L) 7.0 7.3  Albumin 3.5 - 5.0 g/dL 2.6(L) 2.9(L) 2.8(L)  AST 15 - 41 U/L _0 ALT 0 - 44 U/L 43 50(H) 58(H)  Alk Phosphatase 38 - 126 U/L 43 52 47  Total Bilirubin 0.3 - 1.2 mg/dL 0.5 0.5 0.5  Bilirubin, Direct 0.0 - 0.3 mg/dL - - -     Transaminitis: Mild asymptomatic COVID-19 viral infection related transaminitis, stable trend.  AKI: Appears to be very mild-suspect hemodynamically mediated-supportive care for now-encourage oral intake.  Repeat electrolytes tomorrow.  NSVT: Dramatic with stable magnesium, doubled beta-blocker.  65 to 70% in mid 2017 , no further work-up.  HTN: BP stable, Norvasc discontinued to double beta-blocker for above.  HLD: Continue statin  History of glaucoma/corneal transplantation/legal blindness: On usual home regimen of eyedrops.  Mild dehydration with hypernatremia.  Gentle IV D5W.    Goals of care: DNR in place-plans are to continue with supportive care-to see if patient will improve in the next few days.  I had a long discussion with the patient's daughter on 1/16-while we wait and continue to hope she will improve-family is well aware that if patient deteriorates any further-apart from hospice care-we really will not have any options.  Consults  :  None  Procedures  :  None   Condition - Stable  Family Communication  : Spoke with the patient's daughter on 1/17  Code Status :  DNR  Diet :  Diet Order            Diet Heart Room service  appropriate? Yes; Fluid consistency: Thin  Diet effective now               Disposition Plan  :  Remain hospitalized-suspect will require SNF on discharge.  Will need to follow clinical trajectory to determine appropriate disposition.  Barriers to discharge: Hypoxia requiring O2 supplementation/complete 5 days of IV Remdesivir  Antimicorbials  :    Anti-infectives (From admission, onward)   Start     Dose/Rate Route Frequency Ordered Stop   04/26/19 1000  remdesivir 100 mg in sodium chloride 0.9 % 100 mL IVPB     100 mg 200 mL/hr over 30 Minutes Intravenous Daily 05/02/2019 1500 04/29/19 0937   05/08/2019 1500  remdesivir 100 mg in sodium chloride 0.9 % 100 mL IVPB     100 mg 200 mL/hr over 30 Minutes Intravenous Every 30 min 04/15/2019 1451 05/09/2019 1614   04/15/2019 1345  cefTRIAXone (ROCEPHIN) 1 g in sodium chloride 0.9 % 100 mL IVPB     1 g 200 mL/hr over 30 Minutes Intravenous  Once 05/01/2019 1343 05/13/2019 1450   04/26/2019 1345  azithromycin (ZITHROMAX) 500 mg in sodium chloride 0.9 % 250 mL IVPB     500 mg 250 mL/hr over 60 Minutes Intravenous  Once 04/17/2019 1343 04/15/2019 1514     DVT Prophylaxis  :  Lovenox   Inpatient Medications  Scheduled Meds: . vitamin C  500 mg Oral Daily  . atorvastatin  10 mg Oral Daily  . brimonidine  1 drop Both Eyes BID   And  . timolol  1 drop Both Eyes BID  . enoxaparin (LOVENOX) injection  30 mg Subcutaneous Q24H  . escitalopram  10 mg  Oral Daily  . insulin aspart  0-15 Units Subcutaneous TID WC  . insulin aspart  0-5 Units Subcutaneous QHS  . insulin aspart  2 Units Subcutaneous TID WC  . loteprednol  1 drop Right Eye QID  . methylPREDNISolone (SOLU-MEDROL) injection  40 mg Intravenous Daily  . metoprolol succinate  50 mg Oral Daily  . nortriptyline  20 mg Oral QHS  . pantoprazole  40 mg Oral Daily  . prednisoLONE acetate  1 drop Left Eye BID  . sodium chloride flush  3 mL Intravenous Q12H  . zinc sulfate  220 mg Oral Daily    Continuous Infusions:  PRN Meds:.acetaminophen, albuterol, chlorpheniramine-HYDROcodone, guaiFENesin-dextromethorphan, loperamide, [DISCONTINUED] ondansetron **OR** ondansetron (ZOFRAN) IV, polyethylene glycol   Time Spent in minutes 35   See all Orders from today for further details   Lala Lund M.D on 04/30/2019 at 10:18 AM  To page go to [email or web address] - use universal password  [facility] -  Office  [phone]    Objective:   Vitals:   04/30/19 0233 04/30/19 0412 04/30/19 0457 04/30/19 0810  BP:  131/81  140/83  Pulse: 76 87 76 99  Resp:  (!) 22  20  Temp:  (!) 97.4 F (36.3 C)    TempSrc:  Axillary    SpO2: 95% 92% 93% (!) 89%  Weight:      Height:        Wt Readings from Last 3 Encounters:  04/20/2019 61.3 kg  04/17/19 63.5 kg  08/09/18 65 kg     Intake/Output Summary (Last 24 hours) at 04/30/2019 1018 Last data filed at 04/29/2019 2115 Gross per 24 hour  Intake 1165.64 ml  Output --  Net 1165.64 ml     Physical Exam  Awake, mildly confused but in no distress, moves all 4 extremities to commands, No new F.N deficits,   [place].AT,PERRAL Supple Neck,No JVD, No cervical lymphadenopathy appriciated.  Symmetrical Chest wall movement, Good air movement bilaterally, CTAB RRR,No Gallops, Rubs or new Murmurs, No Parasternal Heave +ve B.Sounds, Abd Soft, No tenderness, No organomegaly appriciated, No rebound - guarding or rigidity. No Cyanosis, Clubbing or edema, No new Rash or bruise    Data Review:    CBC Recent Labs  Lab 04/26/19 0119 04/27/19 0053 04/28/19 0142 04/29/19 0120 04/30/19 0156  WBC 9.3 13.2* 15.2* 17.2* 18.6*  HGB 13.6 14.7 15.8* 15.6* 14.7  HCT 40.8 44.2 47.5* 47.9* 46.3*  PLT 267 350 431* 393 372  MCV 84.1 84.0 84.2 85.7 85.0  MCH 28.0 27.9 28.0 27.9 27.0  MCHC 33.3 33.3 33.3 32.6 31.7  RDW 14.4 14.5 14.6 14.6 14.6  LYMPHSABS 0.8 0.8 0.6* 0.6* 0.8  MONOABS 0.3 0.8 0.8 0.9 1.2*  EOSABS 0.0 0.0 0.0 0.0 0.0  BASOSABS  0.0 0.0 0.1 0.1 0.2*    Chemistries  Recent Labs  Lab 04/26/19 0119 04/27/19 0053 04/28/19 0142 04/29/19 0120 04/30/19 0156  NA 139 142 144 146* 140  K 3.9 4.0 4.7 4.9 4.5  CL 105 106 106 106 105  CO2 _0 GLUCOSE 187* 171* 224* 159* 115*  BUN 27* 51* 54* 51* 40*  CREATININE 1.02* 1.21* 1.45* 1.28* 1.00  CALCIUM 8.6* 9.3 9.6 9.8 9.2  MG  --   --   --  2.9* 2.4  AST 73* 48* _1 ALT 78* 73* 58* 50* 43  ALKPHOS 35* 39 47 52 43  BILITOT 0.5 0.3 0.5 0.5 0.5   ------------------------------------------------------------------------------------------------------------------ No  results for input(s): CHOL, HDL, LDLCALC, TRIG, CHOLHDL, LDLDIRECT in the last 72 hours.  Lab Results  Component Value Date   HGBA1C 6.1 (H) 04/26/2019   ------------------------------------------------------------------------------------------------------------------ No results for input(s): TSH, T4TOTAL, T3FREE, THYROIDAB in the last 72 hours.  Invalid input(s): FREET3 ------------------------------------------------------------------------------------------------------------------ Recent Labs    04/28/19 0142 04/29/19 0120  FERRITIN 716* 691*    Coagulation profile No results for input(s): INR, PROTIME in the last 168 hours.  Recent Labs    04/29/19 0120 04/30/19 0156  DDIMER 1.40* 1.63*    Cardiac Enzymes No results for input(s): CKMB, TROPONINI, MYOGLOBIN in the last 168 hours.  Invalid input(s): CK ------------------------------------------------------------------------------------------------------------------    Component Value Date/Time   BNP 77.4 04/30/2019 0156    Micro Results Recent Results (from the past 240 hour(s))  Blood Culture (routine x 2)     Status: None (Preliminary result)   Collection Time: 04/26/2019  1:03 PM   Specimen: Right Antecubital; Blood  Result Value Ref Range Status   Specimen Description   Final    RIGHT ANTECUBITAL Performed at  [facility], [address] [place], [place] [address]    Special Requests   Final    BOTTLES DRAWN AEROBIC AND ANAEROBIC Blood Culture results may not be optimal due to an inadequate volume of blood received in culture bottles Performed at [facility], [address] [place], [place] [address]    Culture   Final    NO GROWTH 4 DAYS Performed at [facility], [place] [address], [place], [place] [address]    Report Status PENDING  Incomplete  Blood Culture (routine x 2)     Status: None (Preliminary result)   Collection Time: 04/15/2019  1:18 PM   Specimen: BLOOD LEFT HAND  Result Value Ref Range Status   Specimen Description   Final    BLOOD LEFT HAND Performed at [facility], [address] [place], [place] [address]    Special Requests   Final    BOTTLES DRAWN AEROBIC AND ANAEROBIC Blood Culture results may not be optimal due to an inadequate volume of blood received in culture bottles Performed at [facility], [address] [place], [place] [address]    Culture   Final    NO GROWTH 4 DAYS Performed at [facility], [place] [address], [place], [place] [address]    Report Status PENDING  Incomplete  C difficile quick scan w PCR reflex     Status: None   Collection Time: 05/12/2019 10:38 PM   Specimen: STOOL  Result Value Ref Range Status   C Diff antigen NEGATIVE NEGATIVE Final   C Diff toxin NEGATIVE NEGATIVE Final   C Diff interpretation No C. difficile detected.  Final    Comment: Performed at [facility], [place] [address] [place], [place] [address]    Radiology Reports DG Chest [place] 1 View  Result Date: 04/29/2019 CLINICAL DATA:  Brought in by EMS from home for SOB , COVID+, cough x 2 days EXAM: PORTABLE CHEST 1 VIEW COMPARISON:  04/17/2019 FINDINGS: There hazy airspace lung opacities predominantly in the mid to lower lungs, new or increased when compared to the prior exam. No convincing pleural  effusion.  No pneumothorax. Cardiac silhouette is normal in size. No mediastinal or hilar masses. Skeletal structures are grossly intact. IMPRESSION: 1. Bilateral hazy airspace lung opacities have developed since prior study consistent multifocal pneumonia  due to COVID-19 infection. Electronically Signed   By: Lajean Manes M.D.   On: 05/04/2019 13:39   DG Chest Portable 1 View  Result Date: 04/17/2019 CLINICAL DATA:  Cough and shortness of breath.  Hypertension. EXAM: PORTABLE CHEST 1 VIEW COMPARISON:  April 01, 2017 FINDINGS: There is no appreciable edema or consolidation. Heart is mildly enlarged with pulmonary vascularity normal. No adenopathy. Mild prominence of the aorta is stable. There is aortic atherosclerosis. There is degenerative change in the thoracic spine. No adenopathy evident. IMPRESSION: No edema or consolidation. Stable cardiac prominence. Prominence of the aorta may reflect chronic hypertension and appears stable. Aortic Atherosclerosis (ICD10-I70.0). Electronically Signed   By: Lowella Grip III M.D.   On: 04/17/2019 13:02

## 2019-04-30 NOTE — Plan of Care (Signed)
  Problem: Education: Goal: Knowledge of General Education information will improve Description Including pain rating scale, medication(s)/side effects and non-pharmacologic comfort measures Outcome: Progressing   

## 2019-04-30 NOTE — Progress Notes (Signed)
Physical Therapy Treatment Patient Details Name: Mary Trevino MRN: [id number] DOB: February 11, 1927 Today's Date: 04/30/2019    History of Present Illness Very frail 86 year old female admitted with Acute respiratory failure with hypoxia; PMH includes HTN, HLD, legal blindness, mild HOH. Has had corneal implants (2014/2016)    PT Comments    Lethargic but arouses to name- very frail and generally weak. Mod to Max of 2 for all bed mobility. Requires max encouragement and reassurance for all OOB activity. Transferred bed to [facility] chair today with call light in reach and alarm activated. Reminded and reviewed ex both LE and IS/Flutter with good return demo. Should continue to benefit from PT to further address goals for optimal functional outcomes.  Follow Up Recommendations        Equipment Recommendations       Recommendations for Other Services       Precautions / Restrictions Precautions Precautions: Fall Precaution Comments: desats; at risk for skin breakdown Restrictions Weight Bearing Restrictions: No Other Position/Activity Restrictions: She is very frail and requires a great deal of encouragement to change positions    Mobility  Bed Mobility Overal bed mobility: Needs Assistance Bed Mobility: Rolling Rolling: Mod assist;Max assist;+2 for physical assistance            Transfers Overall transfer level: Needs assistance Equipment used: 2 person hand held assist                Ambulation/Gait   Gait Distance (Feet): 6 Feet Assistive device: 2 person hand held assist Gait Pattern/deviations: Narrow base of support;Shuffle   Gait velocity interpretation: <1.8 ft/sec, indicate of risk for recurrent falls     Stairs             Wheelchair Mobility    Modified Rankin (Stroke Patients Only)       Balance Overall balance assessment: Mild deficits observed, not formally tested                                          Cognition  Arousal/Alertness: Lethargic Behavior During Therapy: Flat affect                                   General Comments: cognition most liely at baseline; will further assess      Exercises General Exercises - Lower Extremity Ankle Circles/Pumps: AROM;AAROM;Seated Short Arc Quad: AROM;AAROM;Seated Heel Slides: AROM;AAROM;Seated Hip ABduction/ADduction: AROM;AAROM;Seated Other Exercises Other Exercises: Practiced IS and able to achieve 750  for 8 out of 10 repetitions. Reminded to use every hour and it was placed on her bedside table. She is legally blind but she states that she sees the unit, and did have good return demo.    General Comments        Pertinent Vitals/Pain Pain Assessment: No/denies pain    Home Living                      Prior Function            PT Goals (current goals can now be found in the care plan section) Acute Rehab PT Goals Patient Stated Goal: Just want to get well and go home with my family PT Goal Formulation: With patient Time For Goal Achievement: 05/10/19 Potential to Achieve Goals: Good Progress towards PT  goals: (Little to no progression so far.Will continue to monitor and encourage for OOB activity)    Frequency    Min 3X/week      PT Plan      Co-evaluation              AM-PAC PT "6 Clicks" Mobility   Outcome Measure  Help needed turning from your back to your side while in a flat bed without using bedrails?: A Lot Help needed moving from lying on your back to sitting on the side of a flat bed without using bedrails?: A Lot Help needed moving to and from a bed to a chair (including a wheelchair)?: A Lot Help needed standing up from a chair using your arms (e.g., wheelchair or bedside chair)?: A Lot Help needed to walk in hospital room?: A Lot Help needed climbing 3-5 steps with a railing? : A Lot 6 Click Score: 12    End of Session   Activity Tolerance: Patient limited by fatigue Patient  left: in chair;with call bell/phone within reach;with bed alarm set   PT Visit Diagnosis: Muscle weakness (generalized) (M62.81)     Time: 0300-0330 PT Time Calculation (min) (ACUTE ONLY): 30 min  Charges:  $Therapeutic Exercise: 8-22 mins $Therapeutic Activity: 8-22 mins                     Rollen Sox, PT # [phone] CGV cell   Casandra Doffing 04/30/2019, 4:45 PM

## 2019-05-01 ENCOUNTER — Inpatient Hospital Stay (HOSPITAL_COMMUNITY): Payer: Medicare Other

## 2019-05-01 LAB — BRAIN NATRIURETIC PEPTIDE: B Natriuretic Peptide: 117 pg/mL — ABNORMAL HIGH (ref 0.0–100.0)

## 2019-05-01 LAB — CBC WITH DIFFERENTIAL/PLATELET
Abs Immature Granulocytes: 0.85 10*3/uL — ABNORMAL HIGH (ref 0.00–0.07)
Basophils Absolute: 0.1 10*3/uL (ref 0.0–0.1)
Basophils Relative: 1 %
Eosinophils Absolute: 0 10*3/uL (ref 0.0–0.5)
Eosinophils Relative: 0 %
HCT: 49.4 % — ABNORMAL HIGH (ref 36.0–46.0)
Hemoglobin: 16.1 g/dL — ABNORMAL HIGH (ref 12.0–15.0)
Immature Granulocytes: 5 %
Lymphocytes Relative: 6 %
Lymphs Abs: 0.9 10*3/uL (ref 0.7–4.0)
MCH: 27.6 pg (ref 26.0–34.0)
MCHC: 32.6 g/dL (ref 30.0–36.0)
MCV: 84.6 fL (ref 80.0–100.0)
Monocytes Absolute: 1.1 10*3/uL — ABNORMAL HIGH (ref 0.1–1.0)
Monocytes Relative: 7 %
Neutro Abs: 12.8 10*3/uL — ABNORMAL HIGH (ref 1.7–7.7)
Neutrophils Relative %: 81 %
Platelets: 335 10*3/uL (ref 150–400)
RBC: 5.84 MIL/uL — ABNORMAL HIGH (ref 3.87–5.11)
RDW: 14.8 % (ref 11.5–15.5)
WBC: 15.8 10*3/uL — ABNORMAL HIGH (ref 4.0–10.5)
nRBC: 0.1 % (ref 0.0–0.2)

## 2019-05-01 LAB — COMPREHENSIVE METABOLIC PANEL
ALT: 63 U/L — ABNORMAL HIGH (ref 0–44)
AST: 36 U/L (ref 15–41)
Albumin: 2.6 g/dL — ABNORMAL LOW (ref 3.5–5.0)
Alkaline Phosphatase: 50 U/L (ref 38–126)
Anion gap: 10 (ref 5–15)
BUN: 43 mg/dL — ABNORMAL HIGH (ref 8–23)
CO2: 25 mmol/L (ref 22–32)
Calcium: 9.4 mg/dL (ref 8.9–10.3)
Chloride: 108 mmol/L (ref 98–111)
Creatinine, Ser: 1.03 mg/dL — ABNORMAL HIGH (ref 0.44–1.00)
GFR calc Af Amer: 55 mL/min — ABNORMAL LOW (ref >60)
GFR calc non Af Amer: 48 mL/min — ABNORMAL LOW (ref >60)
Glucose, Bld: 106 mg/dL — ABNORMAL HIGH (ref 70–99)
Potassium: 4.7 mmol/L (ref 3.5–5.1)
Sodium: 143 mmol/L (ref 135–145)
Total Bilirubin: 0.6 mg/dL (ref 0.3–1.2)
Total Protein: 6.1 g/dL — ABNORMAL LOW (ref 6.5–8.1)

## 2019-05-01 LAB — MAGNESIUM: Magnesium: 2.4 mg/dL (ref 1.7–2.4)

## 2019-05-01 LAB — GLUCOSE, CAPILLARY
Glucose-Capillary: 117 mg/dL — ABNORMAL HIGH (ref 70–99)
Glucose-Capillary: 184 mg/dL — ABNORMAL HIGH (ref 70–99)
Glucose-Capillary: 198 mg/dL — ABNORMAL HIGH (ref 70–99)
Glucose-Capillary: 157 mg/dL — ABNORMAL HIGH (ref 70–99)

## 2019-05-01 LAB — D-DIMER, QUANTITATIVE: D-Dimer, Quant: 1.6 ug/mL-FEU — ABNORMAL HIGH (ref 0.00–0.50)

## 2019-05-01 LAB — PROCALCITONIN: Procalcitonin: <0.1 ng/mL

## 2019-05-01 LAB — C-REACTIVE PROTEIN: CRP: 1.4 mg/dL — ABNORMAL HIGH (ref <1.0)

## 2019-05-01 MED ORDER — FUROSEMIDE 10 MG/ML IJ SOLN
40.0000 mg | Freq: Once | INTRAMUSCULAR | Status: AC
  Administered 2019-05-01: 13:00:00 40 mg via INTRAVENOUS
  Filled 2019-05-01: qty 4

## 2019-05-01 MED ORDER — ENOXAPARIN SODIUM 40 MG/0.4ML ~~LOC~~ SOLN
40.0000 mg | SUBCUTANEOUS | Status: DC
  Administered 2019-05-01 – 2019-05-04 (×3): 40 mg via SUBCUTANEOUS
  Filled 2019-05-01 (×4): qty 0.4

## 2019-05-01 NOTE — Progress Notes (Signed)
Patient refused lunch and refused to get out of bed into chair at this time. Will attempt to encourage patient to get out of chair after she naps. Will continue to monitor.

## 2019-05-01 NOTE — Progress Notes (Signed)
Pt educated on mobility being a part of recovery, daughter was called for encouragement. Pt continues to refuse to get out of bed after several attempts throughout day. 92% 15L.[name] Pt refused dinner. Continues to swat spoon and say "later not now". Will continue to monitor.

## 2019-05-01 NOTE — Progress Notes (Signed)
RN and Deloris Ping PT transferred pt to chair, she sat in chair for less than 2 hours until she demanded to get back into bed. Pt assisted back to bed, bed in low position, call bed within reach. Will continue to monitor.

## 2019-05-01 NOTE — Progress Notes (Signed)
PROGRESS NOTE                                                                                                                                                                                                             Patient Demographics:    Mary Trevino, is a 86 y.o. female, DOB - Nov 06, 1926, LGX:[id number]  Outpatient Primary MD for the patient is Debbrah Alar, NP   Admit date - 04/18/2019   LOS - 6  Chief Complaint  Patient presents with  . Shortness of Breath       Brief Narrative: Patient is a 86 y.o. female with PMHx of HTN, HLD, glaucoma, legal blindness, history of corneal transplantation who presented as a transfer from [place] on 1/13 for evaluation of severe hypoxemia secondary to COVID-19 pneumonia.    She was diagnosed with COVID-19 on 1/5 and was isolating at home.  COVID-19 medications: Steroids: 1/13>> Remdesivir: 1/13>> Actemra: 1/13 x 1   Subjective:   Patient in bed, appears comfortable, denies any headache chest or abdominal pain, mild shortness of breath.   Assessment  & Plan :   Acute Hypoxic Resp Failure due to Covid 19 Viral pneumonia: She severe disease and was treated with full scope of treatment with IV steroids, Actemra and remdesivir.  He continues to appear to be in no distress but requiring 15 L nasal cannula oxygen, I again question her pulse oximeter readings however these were now confirmed multiple times to be accurate.  Will give her a trial of IV Lasix and continue to monitor closely.    Encouraged to sit up in chair in the daytime use I-S and flutter valve for pulmonary toiletry and prone in bed at night.  Spoke with the patient's daughter on 1/17, spoke with daughter again on 05/01/2019.  Plan is to continue medical care.  If she declines or is suffering then focus on comfort measures/hospice.    COVID-19 Labs: Recent Labs    04/29/19 0120  04/30/19 0156 04/30/19 0500 05/01/19 0543  DDIMER 1.40* 1.63*  --  1.60*  FERRITIN 691*  --   --   --   CRP 6.7*  --  3.2* 1.4*       Component Value Date/Time   BNP 117.0 (H) 05/01/2019 0543    Recent Labs  Lab 05/03/2019 1318 05/01/19  0607  PROCALCITON 0.41 <0.10    No results found for: SARSCOV2NAA   Hepatic Function Latest Ref Rng & Units 05/01/2019 04/30/2019 04/29/2019  Total Protein 6.5 - 8.1 g/dL 6.1(L) 6.3(L) 7.0  Albumin 3.5 - 5.0 g/dL 2.6(L) 2.6(L) 2.9(L)  AST 15 - 41 U/L 36 30 25  ALT 0 - 44 U/L 63(H) 43 50(H)  Alk Phosphatase 38 - 126 U/L 50 43 52  Total Bilirubin 0.3 - 1.2 mg/dL 0.6 0.5 0.5  Bilirubin, Direct 0.0 - 0.3 mg/dL - - -     Transaminitis: Mild asymptomatic COVID-19 viral infection related transaminitis, stable trend.  AKI: Appears to be very mild-suspect hemodynamically mediated-supportive care for now-encourage oral intake.  Repeat electrolytes tomorrow.  NSVT: Dramatic with stable magnesium, doubled beta-blocker.  65 to 70% in mid 2017 , no further work-up.  HTN: BP stable, Norvasc discontinued to double beta-blocker for above.  HLD: Continue statin  History of glaucoma/corneal transplantation/legal blindness: On usual home regimen of eyedrops.  Mild dehydration with hypernatremia.  Gentle IV D5W.    Goals of care: DNR in place-plans are to continue with supportive care-to see if patient will improve in the next few days.  I had a long discussion with the patient's daughter on 1/16-while we wait and continue to hope she will improve-family is well aware that if patient deteriorates any further-apart from hospice care-we really will not have any options.  Consults  :  None  Procedures  :  None   Condition - Stable  Family Communication  : Spoke with the patient's daughter on 1/17, spoke with daughter again on 05/01/2019.  Plan is to continue medical care.  If she declines or is suffering then focus on comfort measures/hospice.  Code  Status :  DNR  Diet :  Diet Order            Diet Heart Room service appropriate? Yes; Fluid consistency: Thin  Diet effective now               Disposition Plan  :  Remain hospitalized-suspect will require SNF on discharge.  Will need to follow clinical trajectory to determine appropriate disposition.  Barriers to discharge: Hypoxia requiring O2 supplementation/complete 5 days of IV Remdesivir  Antimicorbials  :    Anti-infectives (From admission, onward)   Start     Dose/Rate Route Frequency Ordered Stop   04/26/19 1000  remdesivir 100 mg in sodium chloride 0.9 % 100 mL IVPB     100 mg 200 mL/hr over 30 Minutes Intravenous Daily 05/13/2019 1500 04/29/19 0937   04/15/2019 1500  remdesivir 100 mg in sodium chloride 0.9 % 100 mL IVPB     100 mg 200 mL/hr over 30 Minutes Intravenous Every 30 min 05/12/2019 1451 05/03/2019 1614   04/26/2019 1345  cefTRIAXone (ROCEPHIN) 1 g in sodium chloride 0.9 % 100 mL IVPB     1 g 200 mL/hr over 30 Minutes Intravenous  Once 05/13/2019 1343 05/02/2019 1450   04/26/2019 1345  azithromycin (ZITHROMAX) 500 mg in sodium chloride 0.9 % 250 mL IVPB     500 mg 250 mL/hr over 60 Minutes Intravenous  Once 04/16/2019 1343 04/23/2019 1514     DVT Prophylaxis  :  Lovenox   Inpatient Medications  Scheduled Meds: . vitamin C  500 mg Oral Daily  . atorvastatin  10 mg Oral Daily  . brimonidine  1 drop Both Eyes BID   And  . timolol  1 drop Both Eyes BID  .  enoxaparin (LOVENOX) injection  30 mg Subcutaneous Q24H  . escitalopram  10 mg Oral Daily  . furosemide  40 mg Intravenous Once  . insulin aspart  0-15 Units Subcutaneous TID WC  . insulin aspart  0-5 Units Subcutaneous QHS  . insulin aspart  2 Units Subcutaneous TID WC  . loteprednol  1 drop Right Eye QID  . methylPREDNISolone (SOLU-MEDROL) injection  30 mg Intravenous Daily  . metoprolol succinate  50 mg Oral Daily  . nortriptyline  20 mg Oral QHS  . pantoprazole  40 mg Oral Daily  . prednisoLONE acetate  1  drop Left Eye BID  . sodium chloride flush  3 mL Intravenous Q12H  . zinc sulfate  220 mg Oral Daily   Continuous Infusions:  PRN Meds:.acetaminophen, albuterol, chlorpheniramine-HYDROcodone, guaiFENesin-dextromethorphan, loperamide, [DISCONTINUED] ondansetron **OR** ondansetron (ZOFRAN) IV, polyethylene glycol   Time Spent in minutes 35   See all Orders from today for further details   Lala Lund M.D on 05/01/2019 at 11:39 AM  To page go to [email or web address] - use universal password  [facility] -  Office  [phone]    Objective:   Vitals:   04/30/19 1911 05/01/19 0438 05/01/19 0738 05/01/19 1102  BP:  (!) 147/90 127/90   Pulse:  87 95 99  Resp:  16 16   Temp:  97.6 F (36.4 C)    TempSrc:  Oral Axillary   SpO2: 91% 96% 93% (!) 82%  Weight:      Height:        Wt Readings from Last 3 Encounters:  05/05/2019 61.3 kg  04/17/19 63.5 kg  08/09/18 65 kg     Intake/Output Summary (Last 24 hours) at 05/01/2019 1139 Last data filed at 05/01/2019 0330 Gross per 24 hour  Intake --  Output 320 ml  Net -320 ml     Physical Exam  Awake, mildly confused but in no distress, moves all 4 extremities to commands, No new F.N deficits,   [place].AT,PERRAL Supple Neck,No JVD, No cervical lymphadenopathy appriciated.  Symmetrical Chest wall movement, Good air movement bilaterally, few rales RRR,No Gallops, Rubs or new Murmurs, No Parasternal Heave +ve B.Sounds, Abd Soft, No tenderness, No organomegaly appriciated, No rebound - guarding or rigidity. No Cyanosis, Clubbing or edema, No new Rash or bruise     Data Review:    CBC Recent Labs  Lab 04/27/19 0053 04/28/19 0142 04/29/19 0120 04/30/19 0156 05/01/19 0543  WBC 13.2* 15.2* 17.2* 18.6* 15.8*  HGB 14.7 15.8* 15.6* 14.7 16.1*  HCT 44.2 47.5* 47.9* 46.3* 49.4*  PLT 350 431* 393 372 335  MCV 84.0 84.2 85.7 85.0 84.6  MCH 27.9 28.0 27.9 27.0 27.6  MCHC 33.3 33.3 32.6 31.7 32.6  RDW 14.5 14.6 14.6 14.6 14.8   LYMPHSABS 0.8 0.6* 0.6* 0.8 0.9  MONOABS 0.8 0.8 0.9 1.2* 1.1*  EOSABS 0.0 0.0 0.0 0.0 0.0  BASOSABS 0.0 0.1 0.1 0.2* 0.1    Chemistries  Recent Labs  Lab 04/27/19 0053 04/28/19 0142 04/29/19 0120 04/30/19 0156 05/01/19 0543  NA 142 144 146* 140 143  K 4.0 4.7 4.9 4.5 4.7  CL 106 106 106 105 108  CO2 '23 26 28 25 25  '$ GLUCOSE 171* 224* 159* 115* 106*  BUN 51* 54* 51* 40* 43*  CREATININE 1.21* 1.45* 1.28* 1.00 1.03*  CALCIUM 9.3 9.6 9.8 9.2 9.4  MG  --   --  2.9* 2.4 2.4  AST 48* '28 25 30 '$ 36  ALT 73* 58*  50* 43 63*  ALKPHOS 39 47 52 43 50  BILITOT 0.3 0.5 0.5 0.5 0.6   ------------------------------------------------------------------------------------------------------------------ No results for input(s): CHOL, HDL, LDLCALC, TRIG, CHOLHDL, LDLDIRECT in the last 72 hours.  Lab Results  Component Value Date   HGBA1C 6.1 (H) 04/26/2019   ------------------------------------------------------------------------------------------------------------------ No results for input(s): TSH, T4TOTAL, T3FREE, THYROIDAB in the last 72 hours.  Invalid input(s): FREET3 ------------------------------------------------------------------------------------------------------------------ Recent Labs    04/29/19 0120  FERRITIN 691*    Coagulation profile No results for input(s): INR, PROTIME in the last 168 hours.  Recent Labs    04/30/19 0156 05/01/19 0543  DDIMER 1.63* 1.60*    Cardiac Enzymes No results for input(s): CKMB, TROPONINI, MYOGLOBIN in the last 168 hours.  Invalid input(s): CK ------------------------------------------------------------------------------------------------------------------    Component Value Date/Time   BNP 117.0 (H) 05/01/2019 0543    Micro Results Recent Results (from the past 240 hour(s))  Blood Culture (routine x 2)     Status: None   Collection Time: 04/19/2019  1:03 PM   Specimen: Right Antecubital; Blood  Result Value Ref Range Status    Specimen Description   Final    RIGHT ANTECUBITAL Performed at [facility], [address] [place], [place] [address]    Special Requests   Final    BOTTLES DRAWN AEROBIC AND ANAEROBIC Blood Culture results may not be optimal due to an inadequate volume of blood received in culture bottles Performed at [facility], [address] [place], [place] [address]    Culture   Final    NO GROWTH 5 DAYS Performed at [facility], [place] [address], [place], [place] [address]    Report Status 04/30/2019 FINAL  Final  Blood Culture (routine x 2)     Status: None   Collection Time: 05/02/2019  1:18 PM   Specimen: BLOOD LEFT HAND  Result Value Ref Range Status   Specimen Description   Final    BLOOD LEFT HAND Performed at [facility], [address] [place], [place] [address]    Special Requests   Final    BOTTLES DRAWN AEROBIC AND ANAEROBIC Blood Culture results may not be optimal due to an inadequate volume of blood received in culture bottles Performed at [facility], [address] [place], [place] [address]    Culture   Final    NO GROWTH 5 DAYS Performed at [facility], [place] [address], [place], [place] [address]    Report Status 04/30/2019 FINAL  Final  C difficile quick scan w PCR reflex     Status: None   Collection Time: 04/21/2019 10:38 PM   Specimen: STOOL  Result Value Ref Range Status   C Diff antigen NEGATIVE NEGATIVE Final   C Diff toxin NEGATIVE NEGATIVE Final   C Diff interpretation No C. difficile detected.  Final    Comment: Performed at [facility], [place] [address], [place],  [address]    Radiology Reports DG Chest Port 1 View  Result Date: 05/01/2019 CLINICAL DATA:  COVID-19 EXAM: PORTABLE CHEST 1 VIEW COMPARISON:  05/12/2019 FINDINGS: Persistent bilateral opacities. No pleural effusion or pneumothorax. Stable cardiomediastinal contours. IMPRESSION: Similar bilateral opacities  compatible with COVID-19 pneumonia. Electronically Signed   By: Macy Mis M.D.   On: 05/01/2019 09:17   DG Chest Port 1 View  Result Date: 04/13/2019 CLINICAL DATA:  Brought in by EMS from home for SOB ,  COVID+, cough x 2 days EXAM: PORTABLE CHEST 1 VIEW COMPARISON:  04/17/2019 FINDINGS: There hazy airspace lung opacities predominantly in the mid to lower lungs, new or increased when compared to the prior exam. No convincing pleural effusion.  No pneumothorax. Cardiac silhouette is normal in size. No mediastinal or hilar masses. Skeletal structures are grossly intact. IMPRESSION: 1. Bilateral hazy airspace lung opacities have developed since prior study consistent multifocal pneumonia due to COVID-19 infection. Electronically Signed   By: Lajean Manes M.D.   On: 04/23/2019 13:39   DG Chest Portable 1 View  Result Date: 04/17/2019 CLINICAL DATA:  Cough and shortness of breath.  Hypertension. EXAM: PORTABLE CHEST 1 VIEW COMPARISON:  April 01, 2017 FINDINGS: There is no appreciable edema or consolidation. Heart is mildly enlarged with pulmonary vascularity normal. No adenopathy. Mild prominence of the aorta is stable. There is aortic atherosclerosis. There is degenerative change in the thoracic spine. No adenopathy evident. IMPRESSION: No edema or consolidation. Stable cardiac prominence. Prominence of the aorta may reflect chronic hypertension and appears stable. Aortic Atherosclerosis (ICD10-I70.0). Electronically Signed   By: Lowella Grip III M.D.   On: 04/17/2019 13:02

## 2019-05-01 NOTE — Progress Notes (Signed)
Pt took 3 bites of breakfast and refused to eat anymore. She drank 118 mL of orange juice. Will continue to monitor.

## 2019-05-01 NOTE — Progress Notes (Signed)
OT Cancellation Note  Patient Details Name: Kavitha Webbe MRN: [id number] DOB: September 21, 1926   Cancelled Treatment:    Reason Eval/Treat Not Completed: Other (comment)(Nursing requesting to hold due to SpO2 in mid 80s on 15L)   August Luz, OTR/L   Phylliss Bob 05/01/2019, 2:58 PM

## 2019-05-01 NOTE — Progress Notes (Signed)
Pt has red gel polish on nails on all fingers. Pt family stated the polish is gel and can only be removed by scrapping the polish off. Nellcor was placed on pt forehead. Pt is reading 82-84% on Nellcor forehead reading. 88-91% on left finger probe reading at given time. Pt is on 15L. She is resting in bed with no signs of distress. Will continue to monitor.

## 2019-05-01 NOTE — Plan of Care (Signed)
  Problem: Education: Goal: Knowledge of General Education information will improve Description Including pain rating scale, medication(s)/side effects and non-pharmacologic comfort measures Outcome: Progressing   

## 2019-05-01 NOTE — Progress Notes (Signed)
Attempted to get patient out of bed multiple attempts. She refused multiple attempts. RN asked pt what time is a good time. RN suggested 12 noon and pt agreed. Will return around 12 noon to get pt out of bed. Will continue to monitor.

## 2019-05-02 LAB — CBC WITH DIFFERENTIAL/PLATELET
Abs Immature Granulocytes: 0.83 10*3/uL — ABNORMAL HIGH (ref 0.00–0.07)
Basophils Absolute: 0.1 10*3/uL (ref 0.0–0.1)
Basophils Relative: 1 %
Eosinophils Absolute: 0 10*3/uL (ref 0.0–0.5)
Eosinophils Relative: 0 %
HCT: 51.5 % — ABNORMAL HIGH (ref 36.0–46.0)
Hemoglobin: 17.1 g/dL — ABNORMAL HIGH (ref 12.0–15.0)
Immature Granulocytes: 5 %
Lymphocytes Relative: 5 %
Lymphs Abs: 0.7 10*3/uL (ref 0.7–4.0)
MCH: 28 pg (ref 26.0–34.0)
MCHC: 33.2 g/dL (ref 30.0–36.0)
MCV: 84.4 fL (ref 80.0–100.0)
Monocytes Absolute: 0.9 10*3/uL (ref 0.1–1.0)
Monocytes Relative: 5 %
Neutro Abs: 13.8 10*3/uL — ABNORMAL HIGH (ref 1.7–7.7)
Neutrophils Relative %: 84 %
Platelets: 334 10*3/uL (ref 150–400)
RBC: 6.1 MIL/uL — ABNORMAL HIGH (ref 3.87–5.11)
RDW: 14.6 % (ref 11.5–15.5)
WBC: 16.4 10*3/uL — ABNORMAL HIGH (ref 4.0–10.5)
nRBC: 0.1 % (ref 0.0–0.2)

## 2019-05-02 LAB — COMPREHENSIVE METABOLIC PANEL
ALT: 68 U/L — ABNORMAL HIGH (ref 0–44)
AST: 30 U/L (ref 15–41)
Albumin: 2.8 g/dL — ABNORMAL LOW (ref 3.5–5.0)
Alkaline Phosphatase: 52 U/L (ref 38–126)
Anion gap: 11 (ref 5–15)
BUN: 50 mg/dL — ABNORMAL HIGH (ref 8–23)
CO2: 26 mmol/L (ref 22–32)
Calcium: 9.3 mg/dL (ref 8.9–10.3)
Chloride: 106 mmol/L (ref 98–111)
Creatinine, Ser: 1.21 mg/dL — ABNORMAL HIGH (ref 0.44–1.00)
GFR calc Af Amer: 46 mL/min — ABNORMAL LOW (ref >60)
GFR calc non Af Amer: 39 mL/min — ABNORMAL LOW (ref >60)
Glucose, Bld: 141 mg/dL — ABNORMAL HIGH (ref 70–99)
Potassium: 4.7 mmol/L (ref 3.5–5.1)
Sodium: 143 mmol/L (ref 135–145)
Total Bilirubin: 0.9 mg/dL (ref 0.3–1.2)
Total Protein: 6.2 g/dL — ABNORMAL LOW (ref 6.5–8.1)

## 2019-05-02 LAB — C-REACTIVE PROTEIN: CRP: 1.1 mg/dL — ABNORMAL HIGH (ref <1.0)

## 2019-05-02 LAB — MAGNESIUM: Magnesium: 2.5 mg/dL — ABNORMAL HIGH (ref 1.7–2.4)

## 2019-05-02 LAB — GLUCOSE, CAPILLARY
Glucose-Capillary: 131 mg/dL — ABNORMAL HIGH (ref 70–99)
Glucose-Capillary: 163 mg/dL — ABNORMAL HIGH (ref 70–99)
Glucose-Capillary: 137 mg/dL — ABNORMAL HIGH (ref 70–99)

## 2019-05-02 LAB — BRAIN NATRIURETIC PEPTIDE: B Natriuretic Peptide: 152.7 pg/mL — ABNORMAL HIGH (ref 0.0–100.0)

## 2019-05-02 LAB — D-DIMER, QUANTITATIVE: D-Dimer, Quant: 1.58 ug/mL-FEU — ABNORMAL HIGH (ref 0.00–0.50)

## 2019-05-02 LAB — PROCALCITONIN: Procalcitonin: <0.1 ng/mL

## 2019-05-02 MED ORDER — ENSURE ENLIVE PO LIQD: 237.0000 mL | Freq: Two times a day (BID) | ORAL | Status: DC

## 2019-05-02 NOTE — Progress Notes (Addendum)
Patient has refused all meds and meals this shift. O2 decreased throughout the day as well, O2 currently at 4L sating 100%.

## 2019-05-02 NOTE — Progress Notes (Signed)
Notified Dr. Candiss Norse upon rounds this am that patient is refusing all meds, eye drops, meals, and care crying stating to please let her go home and leave her alone. MD stated to also notify daughter. Message left for daughter.

## 2019-05-02 NOTE — Progress Notes (Signed)
Pt b/p significantly low this AM Systolic noted in the [id number] Pt placed in trendelenburg With q 59min b/p going for the last 40 min.  B/P wnl Report to oncoming RN

## 2019-05-02 NOTE — Progress Notes (Signed)
PT Cancellation Note  Patient Details Name: Dlila Galanis MRN: [id number] DOB: [birth year]   Cancelled Treatment:    Reason Eval/Treat Not Completed: Other (comment)(Patient refusing all care and RN advised to "leave her alone for now". When PT attempted to speak with her , she persisted in saying "just leave me alone please- just let me go"- RN present and physician aware.)  Rollen Sox, PT # [phone] CGV cell  Casandra Doffing 05/02/2019, 9:27 AM

## 2019-05-02 NOTE — Progress Notes (Signed)
RN updated daughter of patient's condition. Daughter states "I believe Mom is tired".. meaning 'tired of living".

## 2019-05-02 NOTE — Progress Notes (Signed)
RN found patient to be naked with O2 off with O2 sats 69%. Gown and oxygen reapplied. O2 slowly returned to 80s but increased O2 from 5L to 7L. O2 sats 90% on 7L HFNC

## 2019-05-02 NOTE — Progress Notes (Signed)
PROGRESS NOTE                                                                                                                                                                                                             Patient Demographics:    Mary Trevino, is a 86 y.o. female, DOB - 04-24-1926, WLN:[id number]  Outpatient Primary MD for the patient is Debbrah Alar, NP   Admit date - 05/10/2019   LOS - 7  Chief Complaint  Patient presents with  . Shortness of Breath       Brief Narrative: Patient is a 86 y.o. female with PMHx of HTN, HLD, glaucoma, legal blindness, history of corneal transplantation who presented as a transfer from [place] on 1/13 for evaluation of severe hypoxemia secondary to COVID-19 pneumonia.    She was diagnosed with COVID-19 on 1/5 and was isolating at home.  COVID-19 medications: Steroids: 1/13>> Remdesivir: 1/13>> Actemra: 1/13 x 1   Subjective:   Patient in bed, appears comfortable, denies any headache, no fever, no chest pain or pressure, no shortness of breath , no abdominal pain. No focal weakness.    Assessment  & Plan :   Acute Hypoxic Resp Failure due to Covid 19 Viral pneumonia: She severe disease and was treated with full scope of treatment with IV steroids, Actemra and remdesivir.  He continues to appear to be in no distress but requiring 15 L nasal cannula oxygen, I again question her pulse oximeter readings however these were now confirmed multiple times to be accurate.  Will give her a trial of IV Lasix and continue to monitor closely.    Encouraged to sit up in chair in the daytime use I-S and flutter valve for pulmonary toiletry and prone in bed at night.  Spoke with the patient's daughter on 1/17, spoke with daughter again on 05/01/2019.  Plan is to continue medical care.  If she declines or is suffering then focus on comfort  measures/hospice.    COVID-19 Labs: Recent Labs    04/30/19 0156 04/30/19 0500 05/01/19 0543 05/02/19 0208  DDIMER 1.63*  --  1.60* 1.58*  CRP  --  3.2* 1.4* 1.1*       Component Value Date/Time   BNP 152.7 (H) 05/02/2019 0208    Recent Labs  Lab 05/05/2019 1318 05/01/19 7408  05/02/19 0208  PROCALCITON 0.41 <0.10 <0.10    No results found for: SARSCOV2NAA   Hepatic Function Latest Ref Rng & Units 05/02/2019 05/01/2019 04/30/2019  Total Protein 6.5 - 8.1 g/dL 6.2(L) 6.1(L) 6.3(L)  Albumin 3.5 - 5.0 g/dL 2.8(L) 2.6(L) 2.6(L)  AST 15 - 41 U/L 30 36 30  ALT 0 - 44 U/L 68(H) 63(H) 43  Alk Phosphatase 38 - 126 U/L 52 50 43  Total Bilirubin 0.3 - 1.2 mg/dL 0.9 0.6 0.5  Bilirubin, Direct 0.0 - 0.3 mg/dL - - -     Transaminitis: Mild asymptomatic COVID-19 viral infection related transaminitis, stable trend.  AKI: Appears to be very mild-suspect hemodynamically mediated-supportive care for now-encourage oral intake.  Repeat electrolytes tomorrow.  NSVT: Dramatic with stable magnesium, doubled beta-blocker.  65 to 70% in mid 2017 , no further work-up.  HTN: BP stable, Norvasc discontinued to double beta-blocker for above.  HLD: Continue statin  History of glaucoma/corneal transplantation/legal blindness: On usual home regimen of eyedrops.  Mild dehydration with hypernatremia.  Gentle IV D5W.    Goals of care: DNR in place-plans are to continue with supportive care-to see if patient will improve in the next few days.  I had a long discussion with the patient's daughter on 1/16-while we wait and continue to hope she will improve-family is well aware that if patient deteriorates any further-apart from hospice care-we really will not have any options.  Consults  :  None  Procedures  :  None   Condition - Stable  Family Communication  : Spoke with the patient's daughter on 1/17, spoke with daughter again on 05/01/2019.  Plan is to continue medical care.  If she declines or  is suffering then focus on comfort measures/hospice.  Code Status :  DNR  Diet :  Diet Order            Diet Heart Room service appropriate? Yes; Fluid consistency: Thin  Diet effective now               Disposition Plan  :  Remain hospitalized-suspect will require SNF on discharge.  Will need to follow clinical trajectory to determine appropriate disposition.  Barriers to discharge: Hypoxia requiring O2 supplementation/complete 5 days of IV Remdesivir  Antimicorbials  :    Anti-infectives (From admission, onward)   Start     Dose/Rate Route Frequency Ordered Stop   04/26/19 1000  remdesivir 100 mg in sodium chloride 0.9 % 100 mL IVPB     100 mg 200 mL/hr over 30 Minutes Intravenous Daily 05/07/2019 1500 04/29/19 0937   04/20/2019 1500  remdesivir 100 mg in sodium chloride 0.9 % 100 mL IVPB     100 mg 200 mL/hr over 30 Minutes Intravenous Every 30 min 05/09/2019 1451 04/19/2019 1614   05/13/2019 1345  cefTRIAXone (ROCEPHIN) 1 g in sodium chloride 0.9 % 100 mL IVPB     1 g 200 mL/hr over 30 Minutes Intravenous  Once 05/05/2019 1343 04/19/2019 1450   05/04/2019 1345  azithromycin (ZITHROMAX) 500 mg in sodium chloride 0.9 % 250 mL IVPB     500 mg 250 mL/hr over 60 Minutes Intravenous  Once 04/28/2019 1343 04/14/2019 1514     DVT Prophylaxis  :  Lovenox   Inpatient Medications  Scheduled Meds: . vitamin C  500 mg Oral Daily  . atorvastatin  10 mg Oral Daily  . brimonidine  1 drop Both Eyes BID   And  . timolol  1 drop Both Eyes  BID  . enoxaparin (LOVENOX) injection  40 mg Subcutaneous Q24H  . escitalopram  10 mg Oral Daily  . insulin aspart  0-15 Units Subcutaneous TID WC  . insulin aspart  0-5 Units Subcutaneous QHS  . insulin aspart  2 Units Subcutaneous TID WC  . loteprednol  1 drop Right Eye QID  . methylPREDNISolone (SOLU-MEDROL) injection  30 mg Intravenous Daily  . metoprolol succinate  50 mg Oral Daily  . nortriptyline  20 mg Oral QHS  . pantoprazole  40 mg Oral Daily  .  prednisoLONE acetate  1 drop Left Eye BID  . sodium chloride flush  3 mL Intravenous Q12H  . zinc sulfate  220 mg Oral Daily   Continuous Infusions:  PRN Meds:.acetaminophen, albuterol, chlorpheniramine-HYDROcodone, guaiFENesin-dextromethorphan, loperamide, [DISCONTINUED] ondansetron **OR** ondansetron (ZOFRAN) IV, polyethylene glycol   Time Spent in minutes 35   See all Orders from today for further details   Lala Lund M.D on 05/02/2019 at 10:33 AM  To page go to [email or web address] - use universal password  [facility] -  Office  [phone]    Objective:   Vitals:   05/02/19 0640 05/02/19 0650 05/02/19 0655 05/02/19 0700  BP: (!) 150/97 133/89 127/88 (!) 135/91  Pulse: (!) 102 89 90 93  Resp:    20  Temp:    (!) 97.5 F (36.4 C)  TempSrc:    Axillary  SpO2: (!) 82% (!) 89%  91%  Weight:      Height:        Wt Readings from Last 3 Encounters:  05/11/2019 61.3 kg  04/17/19 63.5 kg  08/09/18 65 kg     Intake/Output Summary (Last 24 hours) at 05/02/2019 1033 Last data filed at 05/02/2019 0620 Gross per 24 hour  Intake --  Output 1000 ml  Net -1000 ml     Physical Exam  Awake, mildly confused but in no distress, moves all 4 extremities to commands, No new F.N deficits,   [place].AT,PERRAL Supple Neck,No JVD, No cervical lymphadenopathy appriciated.  Symmetrical Chest wall movement, Good air movement bilaterally, CTAB RRR,No Gallops, Rubs or new Murmurs, No Parasternal Heave +ve B.Sounds, Abd Soft, No tenderness, No organomegaly appriciated, No rebound - guarding or rigidity. No Cyanosis, Clubbing or edema, No new Rash or bruise     Data Review:    CBC Recent Labs  Lab 04/28/19 0142 04/29/19 0120 04/30/19 0156 05/01/19 0543 05/02/19 0208  WBC 15.2* 17.2* 18.6* 15.8* 16.4*  HGB 15.8* 15.6* 14.7 16.1* 17.1*  HCT 47.5* 47.9* 46.3* 49.4* 51.5*  PLT 431* 393 372 335 334  MCV 84.2 85.7 85.0 84.6 84.4  MCH 28.0 27.9 27.0 27.6 28.0  MCHC 33.3 32.6  31.7 32.6 33.2  RDW 14.6 14.6 14.6 14.8 14.6  LYMPHSABS 0.6* 0.6* 0.8 0.9 0.7  MONOABS 0.8 0.9 1.2* 1.1* 0.9  EOSABS 0.0 0.0 0.0 0.0 0.0  BASOSABS 0.1 0.1 0.2* 0.1 0.1    Chemistries  Recent Labs  Lab 04/28/19 0142 04/29/19 0120 04/30/19 0156 05/01/19 0543 05/02/19 0208  NA 144 146* 140 143 143  K 4.7 4.9 4.5 4.7 4.7  CL 106 106 105 108 106  CO2 _0 GLUCOSE 224* 159* 115* 106* 141*  BUN 54* 51* 40* 43* 50*  CREATININE 1.45* 1.28* 1.00 1.03* 1.21*  CALCIUM 9.6 9.8 9.2 9.4 9.3  MG  --  2.9* 2.4 2.4 2.5*  AST _1 36 30  ALT 58* 50* 43 63* 68*  ALKPHOS 47 52 43 50 52  BILITOT 0.5 0.5 0.5 0.6 0.9   ------------------------------------------------------------------------------------------------------------------ No results for input(s): CHOL, HDL, LDLCALC, TRIG, CHOLHDL, LDLDIRECT in the last 72 hours.  Lab Results  Component Value Date   HGBA1C 6.1 (H) 04/26/2019   ------------------------------------------------------------------------------------------------------------------ No results for input(s): TSH, T4TOTAL, T3FREE, THYROIDAB in the last 72 hours.  Invalid input(s): FREET3 ------------------------------------------------------------------------------------------------------------------ No results for input(s): VITAMINB12, FOLATE, FERRITIN, TIBC, IRON, RETICCTPCT in the last 72 hours.  Coagulation profile No results for input(s): INR, PROTIME in the last 168 hours.  Recent Labs    05/01/19 0543 05/02/19 0208  DDIMER 1.60* 1.58*    Cardiac Enzymes No results for input(s): CKMB, TROPONINI, MYOGLOBIN in the last 168 hours.  Invalid input(s): CK ------------------------------------------------------------------------------------------------------------------    Component Value Date/Time   BNP 152.7 (H) 05/02/2019 0208    Micro Results Recent Results (from the past 240 hour(s))  Blood Culture (routine x 2)     Status: None   Collection  Time: 04/22/2019  1:03 PM   Specimen: Right Antecubital; Blood  Result Value Ref Range Status   Specimen Description   Final    RIGHT ANTECUBITAL Performed at [facility], [address] [place], [place] [address]    Special Requests   Final    BOTTLES DRAWN AEROBIC AND ANAEROBIC Blood Culture results may not be optimal due to an inadequate volume of blood received in culture bottles Performed at [facility], [address] [place], [place] [address]    Culture   Final    NO GROWTH 5 DAYS Performed at [facility], [place] [address] [place], [place] [address]    Report Status 04/30/2019 FINAL  Final  Blood Culture (routine x 2)     Status: None   Collection Time: 05/07/2019  1:18 PM   Specimen: BLOOD LEFT HAND  Result Value Ref Range Status   Specimen Description   Final    BLOOD LEFT HAND Performed at [facility], [address] [place], [place] [address]    Special Requests   Final    BOTTLES DRAWN AEROBIC AND ANAEROBIC Blood Culture results may not be optimal due to an inadequate volume of blood received in culture bottles Performed at [facility], [address] [place], [place] [address]    Culture   Final    NO GROWTH 5 DAYS Performed at [facility], [place] [address] [place], [place] [address]    Report Status 04/30/2019 FINAL  Final  C difficile quick scan w PCR reflex     Status: None   Collection Time: 04/22/2019 10:38 PM   Specimen: STOOL  Result Value Ref Range Status   C Diff antigen NEGATIVE NEGATIVE Final   C Diff toxin NEGATIVE NEGATIVE Final   C Diff interpretation No C. difficile detected.  Final    Comment: Performed at [facility], [place] [address], [place], [place] [address]    Radiology Reports DG Chest Port 1 View  Result Date: 05/01/2019 CLINICAL DATA:  COVID-19 EXAM: PORTABLE CHEST 1 VIEW COMPARISON:  05/04/2019 FINDINGS: Persistent bilateral opacities. No pleural effusion  or pneumothorax. Stable cardiomediastinal contours. IMPRESSION: Similar bilateral opacities compatible with COVID-19 pneumonia. Electronically Signed   By: Macy Mis M.D.   On: 05/01/2019 09:17   DG Chest Port 1 View  Result Date: 04/19/2019 CLINICAL DATA:  Brought in by EMS from home for SOB ,  COVID+, cough x 2 days EXAM: PORTABLE CHEST 1 VIEW COMPARISON:  04/17/2019 FINDINGS: There hazy airspace lung opacities predominantly in the mid to lower lungs, new or increased when compared to the prior exam. No convincing pleural effusion.  No pneumothorax. Cardiac silhouette is normal in size. No mediastinal or hilar masses. Skeletal structures are grossly intact. IMPRESSION: 1. Bilateral hazy airspace lung opacities have developed since prior study consistent multifocal pneumonia due to COVID-19 infection. Electronically Signed   By: Lajean Manes M.D.   On: 04/20/2019 13:39   DG Chest Portable 1 View  Result Date: 04/17/2019 CLINICAL DATA:  Cough and shortness of breath.  Hypertension. EXAM: PORTABLE CHEST 1 VIEW COMPARISON:  April 01, 2017 FINDINGS: There is no appreciable edema or consolidation. Heart is mildly enlarged with pulmonary vascularity normal. No adenopathy. Mild prominence of the aorta is stable. There is aortic atherosclerosis. There is degenerative change in the thoracic spine. No adenopathy evident. IMPRESSION: No edema or consolidation. Stable cardiac prominence. Prominence of the aorta may reflect chronic hypertension and appears stable. Aortic Atherosclerosis (ICD10-I70.0). Electronically Signed   By: Lowella Grip III M.D.   On: 04/17/2019 13:02

## 2019-05-02 NOTE — Progress Notes (Signed)
OT Cancellation Note  Patient Details Name: Mary Trevino MRN: [id number] DOB: 1926/05/31   Cancelled Treatment:    Reason Eval/Treat Not Completed: Other (comment)(Pt declining to work with OT)  Baird Lyons 05/02/2019, 4:35 PM  Maurie Boettcher, OT/L   Acute OT Clinical Specialist [place] Pager [phone] Office [phone]

## 2019-05-03 LAB — CBC WITH DIFFERENTIAL/PLATELET
Abs Immature Granulocytes: 1.07 10*3/uL — ABNORMAL HIGH (ref 0.00–0.07)
Basophils Absolute: 0.2 10*3/uL — ABNORMAL HIGH (ref 0.0–0.1)
Basophils Relative: 1 %
Eosinophils Absolute: 0 10*3/uL (ref 0.0–0.5)
Eosinophils Relative: 0 %
HCT: 56.2 % — ABNORMAL HIGH (ref 36.0–46.0)
Hemoglobin: 18.7 g/dL — ABNORMAL HIGH (ref 12.0–15.0)
Immature Granulocytes: 5 %
Lymphocytes Relative: 5 %
Lymphs Abs: 1 10*3/uL (ref 0.7–4.0)
MCH: 28.3 pg (ref 26.0–34.0)
MCHC: 33.3 g/dL (ref 30.0–36.0)
MCV: 85 fL (ref 80.0–100.0)
Monocytes Absolute: 1 10*3/uL (ref 0.1–1.0)
Monocytes Relative: 5 %
Neutro Abs: 16.7 10*3/uL — ABNORMAL HIGH (ref 1.7–7.7)
Neutrophils Relative %: 84 %
Platelets: 301 10*3/uL (ref 150–400)
RBC: 6.61 MIL/uL — ABNORMAL HIGH (ref 3.87–5.11)
RDW: 15.9 % — ABNORMAL HIGH (ref 11.5–15.5)
WBC: 20 10*3/uL — ABNORMAL HIGH (ref 4.0–10.5)
nRBC: 0.1 % (ref 0.0–0.2)

## 2019-05-03 LAB — COMPREHENSIVE METABOLIC PANEL
ALT: 58 U/L — ABNORMAL HIGH (ref 0–44)
AST: 27 U/L (ref 15–41)
Albumin: 2.9 g/dL — ABNORMAL LOW (ref 3.5–5.0)
Alkaline Phosphatase: 65 U/L (ref 38–126)
Anion gap: 15 (ref 5–15)
BUN: 58 mg/dL — ABNORMAL HIGH (ref 8–23)
CO2: 26 mmol/L (ref 22–32)
Calcium: 9.6 mg/dL (ref 8.9–10.3)
Chloride: 107 mmol/L (ref 98–111)
Creatinine, Ser: 1.33 mg/dL — ABNORMAL HIGH (ref 0.44–1.00)
GFR calc Af Amer: 41 mL/min — ABNORMAL LOW (ref >60)
GFR calc non Af Amer: 35 mL/min — ABNORMAL LOW (ref >60)
Glucose, Bld: 112 mg/dL — ABNORMAL HIGH (ref 70–99)
Potassium: 4.9 mmol/L (ref 3.5–5.1)
Sodium: 148 mmol/L — ABNORMAL HIGH (ref 135–145)
Total Bilirubin: 1.1 mg/dL (ref 0.3–1.2)
Total Protein: 6.6 g/dL (ref 6.5–8.1)

## 2019-05-03 LAB — POCT I-STAT EG7
Acid-Base Excess: 2 mmol/L (ref 0.0–2.0)
Bicarbonate: 23.5 mmol/L (ref 20.0–28.0)
Calcium, Ion: 1.28 mmol/L (ref 1.15–1.40)
HCT: 50 % — ABNORMAL HIGH (ref 36.0–46.0)
Hemoglobin: 17 g/dL — ABNORMAL HIGH (ref 12.0–15.0)
O2 Saturation: 77 %
Potassium: 4.2 mmol/L (ref 3.5–5.1)
Sodium: 147 mmol/L — ABNORMAL HIGH (ref 135–145)
TCO2: 24 mmol/L (ref 22–32)
pCO2, Ven: 29.1 mmHg — ABNORMAL LOW (ref 44.0–60.0)
pH, Ven: 7.515 — ABNORMAL HIGH (ref 7.250–7.430)
pO2, Ven: 36 mmHg (ref 32.0–45.0)

## 2019-05-03 LAB — GLUCOSE, CAPILLARY
Glucose-Capillary: 135 mg/dL — ABNORMAL HIGH (ref 70–99)
Glucose-Capillary: 131 mg/dL — ABNORMAL HIGH (ref 70–99)
Glucose-Capillary: 258 mg/dL — ABNORMAL HIGH (ref 70–99)

## 2019-05-03 LAB — BRAIN NATRIURETIC PEPTIDE: B Natriuretic Peptide: 90.8 pg/mL (ref 0.0–100.0)

## 2019-05-03 LAB — PROCALCITONIN: Procalcitonin: 0.19 ng/mL

## 2019-05-03 LAB — MAGNESIUM: Magnesium: 2.8 mg/dL — ABNORMAL HIGH (ref 1.7–2.4)

## 2019-05-03 LAB — C-REACTIVE PROTEIN: CRP: 0.9 mg/dL (ref <1.0)

## 2019-05-03 LAB — D-DIMER, QUANTITATIVE: D-Dimer, Quant: 2.16 ug/mL-FEU — ABNORMAL HIGH (ref 0.00–0.50)

## 2019-05-03 MED ORDER — HALOPERIDOL LACTATE 5 MG/ML IJ SOLN
1.0000 mg | Freq: Four times a day (QID) | INTRAMUSCULAR | Status: DC | PRN
  Administered 2019-05-05 – 2019-05-06 (×2): 1 mg via INTRAVENOUS
  Filled 2019-05-03 (×2): qty 1

## 2019-05-03 MED ORDER — METHYLPREDNISOLONE SODIUM SUCC 40 MG IJ SOLR: 20.0000 mg | Freq: Every day | INTRAMUSCULAR | Status: DC

## 2019-05-03 MED ORDER — METHYLPREDNISOLONE SODIUM SUCC 40 MG IJ SOLR
20.0000 mg | Freq: Every day | INTRAMUSCULAR | Status: DC
  Filled 2019-05-03: qty 1

## 2019-05-03 MED ORDER — ENSURE ENLIVE PO LIQD
237.0000 mL | Freq: Three times a day (TID) | ORAL | Status: DC
  Administered 2019-05-04 – 2019-05-05 (×2): 237 mL via ORAL

## 2019-05-03 NOTE — Progress Notes (Signed)
PROGRESS NOTE                                                                                                                                                                                                             Patient Demographics:    Mary Trevino, is a 86 y.o. female, DOB - 11-Mar-1927, CXK:[id number]  Outpatient Primary MD for the patient is Debbrah Alar, NP   Admit date - 04/27/2019   LOS - 8  Chief Complaint  Patient presents with  . Shortness of Breath       Brief Narrative: Patient is a 86 y.o. female with PMHx of HTN, HLD, glaucoma, legal blindness, history of corneal transplantation who presented as a transfer from [place] on 1/13 for evaluation of severe hypoxemia secondary to COVID-19 pneumonia.    She was diagnosed with COVID-19 on 1/5 and was isolating at home.  COVID-19 medications: Steroids: 1/13>> Remdesivir: 1/13>> Actemra: 1/13 x 1   Subjective:   Patient in bed, appears comfortable, denies any headache, no fever, no chest pain or pressure, no shortness of breath , no abdominal pain. No focal weakness.    Assessment  & Plan :   Acute Hypoxic Resp Failure due to Covid 19 Viral pneumonia: She severe disease and was treated with full scope of treatment with IV steroids, Actemra and remdesivir.    Note patient looks much better clinically, steroids being tapered, her pulse ox probe is extremely positional, spent about 20 minutes bedside on 05/03/2019 with the patient's nurse, was able to be titrated down to 5 L from 12 L, VBG confirms it.  She is currently asymptomatic on 4 L and will be left there.  Her fingernails have thick gel nail polish on it preventing proper read.  Earlobes are extremely poor perfused as well.  Encouraged to sit up in chair in the daytime use I-S and flutter valve for pulmonary toiletry and prone in bed at night.  Spoke with the patient's daughter on  1/17, spoke with daughter again on 05/01/2019.  Plan is to continue medical care.  If she declines or is suffering then focus on comfort measures/hospice.    COVID-19 Labs: Recent Labs    05/01/19 0543 05/02/19 0208 05/03/19 0225  DDIMER 1.60* 1.58* 2.16*  CRP 1.4* 1.1* 0.9  Component Value Date/Time   BNP 90.8 05/03/2019 0225    Recent Labs  Lab 05/01/19 0607 05/02/19 0208 05/03/19 0225  PROCALCITON <0.10 <0.10 0.19    No results found for: SARSCOV2NAA   Hepatic Function Latest Ref Rng & Units 05/03/2019 05/02/2019 05/01/2019  Total Protein 6.5 - 8.1 g/dL 6.6 6.2(L) 6.1(L)  Albumin 3.5 - 5.0 g/dL 2.9(L) 2.8(L) 2.6(L)  AST 15 - 41 U/L 27 30 36  ALT 0 - 44 U/L 58(H) 68(H) 63(H)  Alk Phosphatase 38 - 126 U/L 65 52 50  Total Bilirubin 0.3 - 1.2 mg/dL 1.1 0.9 0.6  Bilirubin, Direct 0.0 - 0.3 mg/dL - - -     Transaminitis: Mild asymptomatic COVID-19 viral infection related transaminitis, stable trend.  AKI: Appears to be very mild-suspect hemodynamically mediated-supportive care for now-encourage oral intake.  Repeat electrolytes tomorrow.  NSVT: Dramatic with stable magnesium, doubled beta-blocker.  65 to 70% in mid 2017 , no further work-up.  HTN: BP stable, Norvasc discontinued to double beta-blocker for above.  HLD: Continue statin  History of glaucoma/corneal transplantation/legal blindness: On usual home regimen of eyedrops.  Mild dehydration with hypernatremia.  Resolved after IV fluids.    Goals of care: DNR in place-plans are to continue with supportive care-to see if patient will improve in the next few days.  I had a long discussion with the patient's daughter on 1/16-while we wait and continue to hope she will improve-family is well aware that if patient deteriorates any further-apart from hospice care-we really will not have any options.  Consults  :  None  Procedures  :  None   Condition - Stable  Family Communication  : Spoke with the  patient's daughter on 1/17, spoke with daughter again on 05/01/2019.  Plan is to continue medical care.  If she declines or is suffering then focus on comfort measures/hospice.  Code Status :  DNR  Diet :  Diet Order            Diet Heart Room service appropriate? Yes; Fluid consistency: Thin  Diet effective now               Disposition Plan  :  Remain hospitalized-suspect will require SNF on discharge.  Titrating down her oxygen demand if she cuts down to 5 L we can dispose to SNF.  Barriers to discharge: Hypoxia requiring O2 supplementation/complete 5 days of IV Remdesivir  Antimicorbials  :    Anti-infectives (From admission, onward)   Start     Dose/Rate Route Frequency Ordered Stop   04/26/19 1000  remdesivir 100 mg in sodium chloride 0.9 % 100 mL IVPB     100 mg 200 mL/hr over 30 Minutes Intravenous Daily 04/15/2019 1500 04/29/19 0937   04/24/2019 1500  remdesivir 100 mg in sodium chloride 0.9 % 100 mL IVPB     100 mg 200 mL/hr over 30 Minutes Intravenous Every 30 min 05/04/2019 1451 04/14/2019 1614   04/13/2019 1345  cefTRIAXone (ROCEPHIN) 1 g in sodium chloride 0.9 % 100 mL IVPB     1 g 200 mL/hr over 30 Minutes Intravenous  Once 04/26/2019 1343 04/21/2019 1450   05/11/2019 1345  azithromycin (ZITHROMAX) 500 mg in sodium chloride 0.9 % 250 mL IVPB     500 mg 250 mL/hr over 60 Minutes Intravenous  Once 04/14/2019 1343 04/21/2019 1514     DVT Prophylaxis  :  Lovenox   Inpatient Medications  Scheduled Meds: . vitamin C  500 mg Oral Daily  .  atorvastatin  10 mg Oral Daily  . brimonidine  1 drop Both Eyes BID   And  . timolol  1 drop Both Eyes BID  . enoxaparin (LOVENOX) injection  40 mg Subcutaneous Q24H  . escitalopram  10 mg Oral Daily  . feeding supplement (ENSURE ENLIVE)  237 mL Oral BID BM  . insulin aspart  0-15 Units Subcutaneous TID WC  . insulin aspart  0-5 Units Subcutaneous QHS  . insulin aspart  2 Units Subcutaneous TID WC  . loteprednol  1 drop Right Eye QID  .  [START ON 05/04/2019] methylPREDNISolone (SOLU-MEDROL) injection  20 mg Intravenous Daily  . metoprolol succinate  50 mg Oral Daily  . nortriptyline  20 mg Oral QHS  . pantoprazole  40 mg Oral Daily  . prednisoLONE acetate  1 drop Left Eye BID  . sodium chloride flush  3 mL Intravenous Q12H  . zinc sulfate  220 mg Oral Daily   Continuous Infusions:  PRN Meds:.acetaminophen, albuterol, chlorpheniramine-HYDROcodone, guaiFENesin-dextromethorphan, haloperidol lactate, loperamide, [DISCONTINUED] ondansetron **OR** ondansetron (ZOFRAN) IV, polyethylene glycol   Time Spent in minutes 35   See all Orders from today for further details   Lala Lund M.D on 05/03/2019 at 10:01 AM  To page go to [email or web address] - use universal password  [facility] -  Office  [phone]    Objective:   Vitals:   05/03/19 0529 05/03/19 0530 05/03/19 0534 05/03/19 0800  BP:  127/88  (!) 120/91  Pulse:  95  90  Resp:    20  Temp: 98 F (36.7 C)   (!) 96.3 F (35.7 C)  TempSrc: Axillary   Axillary  SpO2: (!) 87% 92% 92% 94%  Weight:      Height:        Wt Readings from Last 3 Encounters:  04/15/2019 61.3 kg  04/17/19 63.5 kg  08/09/18 65 kg     Intake/Output Summary (Last 24 hours) at 05/03/2019 1001 Last data filed at 05/03/2019 0800 Gross per 24 hour  Intake 150 ml  Output 800 ml  Net -650 ml     Physical Exam  Awake, mildly confused but in no distress, moves all 4 extremities to commands, No new F.N deficits,   [place].AT,PERRAL Supple Neck,No JVD, No cervical lymphadenopathy appriciated.  Symmetrical Chest wall movement, Good air movement bilaterally, CTAB RRR,No Gallops, Rubs or new Murmurs, No Parasternal Heave +ve B.Sounds, Abd Soft, No tenderness, No organomegaly appriciated, No rebound - guarding or rigidity. No Cyanosis, Clubbing or edema, No new Rash or bruise     Data Review:    CBC Recent Labs  Lab 04/29/19 0120 04/29/19 0120 04/30/19 0156 05/01/19 0543  05/02/19 0208 05/03/19 0225 05/03/19 0932  WBC 17.2*  --  18.6* 15.8* 16.4* 20.0*  --   HGB 15.6*   < > 14.7 16.1* 17.1* 18.7* 17.0*  HCT 47.9*   < > 46.3* 49.4* 51.5* 56.2* 50.0*  PLT 393  --  372 335 334 301  --   MCV 85.7  --  85.0 84.6 84.4 85.0  --   MCH 27.9  --  27.0 27.6 28.0 28.3  --   MCHC 32.6  --  31.7 32.6 33.2 33.3  --   RDW 14.6  --  14.6 14.8 14.6 15.9*  --   LYMPHSABS 0.6*  --  0.8 0.9 0.7 1.0  --   MONOABS 0.9  --  1.2* 1.1* 0.9 1.0  --   EOSABS 0.0  --  0.0  0.0 0.0 0.0  --   BASOSABS 0.1  --  0.2* 0.1 0.1 0.2*  --    < > = values in this interval not displayed.    Chemistries  Recent Labs  Lab 04/29/19 0120 04/29/19 0120 04/30/19 0156 05/01/19 0543 05/02/19 0208 05/03/19 0225 05/03/19 0932  NA 146*   < > 140 143 143 148* 147*  K 4.9   < > 4.5 4.7 4.7 4.9 4.2  CL 106  --  105 108 106 107  --   CO2 28  --  '25 25 26 26  '$ --   GLUCOSE 159*  --  115* 106* 141* 112*  --   BUN 51*  --  40* 43* 50* 58*  --   CREATININE 1.28*  --  1.00 1.03* 1.21* 1.33*  --   CALCIUM 9.8  --  9.2 9.4 9.3 9.6  --   MG 2.9*  --  2.4 2.4 2.5* 2.8*  --   AST 25  --  30 36 30 27  --   ALT 50*  --  43 63* 68* 58*  --   ALKPHOS 52  --  43 50 52 65  --   BILITOT 0.5  --  0.5 0.6 0.9 1.1  --    < > = values in this interval not displayed.   ------------------------------------------------------------------------------------------------------------------ No results for input(s): CHOL, HDL, LDLCALC, TRIG, CHOLHDL, LDLDIRECT in the last 72 hours.  Lab Results  Component Value Date   HGBA1C 6.1 (H) 04/26/2019   ------------------------------------------------------------------------------------------------------------------ No results for input(s): TSH, T4TOTAL, T3FREE, THYROIDAB in the last 72 hours.  Invalid input(s): FREET3 ------------------------------------------------------------------------------------------------------------------ No results for input(s): VITAMINB12,  FOLATE, FERRITIN, TIBC, IRON, RETICCTPCT in the last 72 hours.  Coagulation profile No results for input(s): INR, PROTIME in the last 168 hours.  Recent Labs    05/02/19 0208 05/03/19 0225  DDIMER 1.58* 2.16*    Cardiac Enzymes No results for input(s): CKMB, TROPONINI, MYOGLOBIN in the last 168 hours.  Invalid input(s): CK ------------------------------------------------------------------------------------------------------------------    Component Value Date/Time   BNP 90.8 05/03/2019 0225    Micro Results Recent Results (from the past 240 hour(s))  Blood Culture (routine x 2)     Status: None   Collection Time: 05/08/2019  1:03 PM   Specimen: Right Antecubital; Blood  Result Value Ref Range Status   Specimen Description   Final    RIGHT ANTECUBITAL Performed at [facility], [address] [place], [place] [address]    Special Requests   Final    BOTTLES DRAWN AEROBIC AND ANAEROBIC Blood Culture results may not be optimal due to an inadequate volume of blood received in culture bottles Performed at [facility], [address] [place], [place] [address]    Culture   Final    NO GROWTH 5 DAYS Performed at [facility], [place] [address] [place], [place] [address]    Report Status 04/30/2019 FINAL  Final  Blood Culture (routine x 2)     Status: None   Collection Time: 04/30/2019  1:18 PM   Specimen: BLOOD LEFT HAND  Result Value Ref Range Status   Specimen Description   Final    BLOOD LEFT HAND Performed at [facility], [address] [place], [place] [address]    Special Requests   Final    BOTTLES DRAWN AEROBIC AND ANAEROBIC Blood Culture results may not be optimal due to an  inadequate volume of blood received in culture bottles Performed at [facility], [address] [place], [place] [address]    Culture   Final    NO GROWTH 5 DAYS Performed at [facility], [place] [address], [place], [place] [address]      Report Status 04/30/2019 FINAL  Final  C difficile quick scan w PCR reflex     Status: None   Collection Time: 04/17/2019 10:38 PM   Specimen: STOOL  Result Value Ref Range Status   C Diff antigen NEGATIVE NEGATIVE Final   C Diff toxin NEGATIVE NEGATIVE Final   C Diff interpretation No C. difficile detected.  Final    Comment: Performed at [facility], [place] [address] [place],  [address]    Radiology Reports DG Chest Port 1 View  Result Date: 05/01/2019 CLINICAL DATA:  COVID-19 EXAM: PORTABLE CHEST 1 VIEW COMPARISON:  05/08/2019 FINDINGS: Persistent bilateral opacities. No pleural effusion or pneumothorax. Stable cardiomediastinal contours. IMPRESSION: Similar bilateral opacities compatible with COVID-19 pneumonia. Electronically Signed   By: Macy Mis M.D.   On: 05/01/2019 09:17   DG Chest Port 1 View  Result Date: 04/14/2019 CLINICAL DATA:  Brought in by EMS from home for SOB , COVID+, cough x 2 days EXAM: PORTABLE CHEST 1 VIEW COMPARISON:  04/17/2019 FINDINGS: There hazy airspace lung opacities predominantly in the mid to lower lungs, new or increased when compared to the prior exam. No convincing pleural effusion.  No pneumothorax. Cardiac silhouette is normal in size. No mediastinal or hilar masses. Skeletal structures are grossly intact. IMPRESSION: 1. Bilateral hazy airspace lung opacities have developed since prior study consistent multifocal pneumonia due to COVID-19 infection. Electronically Signed   By: Lajean Manes M.D.   On: 04/24/2019 13:39   DG Chest Portable 1 View  Result Date: 04/17/2019 CLINICAL DATA:  Cough and shortness of breath.  Hypertension. EXAM: PORTABLE CHEST 1 VIEW COMPARISON:  April 01, 2017 FINDINGS: There is no appreciable edema or consolidation. Heart is mildly enlarged with pulmonary vascularity normal. No adenopathy. Mild prominence of the aorta is stable. There is aortic atherosclerosis. There is degenerative change in the  thoracic spine. No adenopathy evident. IMPRESSION: No edema or consolidation. Stable cardiac prominence. Prominence of the aorta may reflect chronic hypertension and appears stable. Aortic Atherosclerosis (ICD10-I70.0). Electronically Signed   By: Lowella Grip III M.D.   On: 04/17/2019 13:02

## 2019-05-03 NOTE — Plan of Care (Signed)
  Problem: Education: Goal: Knowledge of General Education information will improve Description: Including pain rating scale, medication(s)/side effects and non-pharmacologic comfort measures 05/03/2019 1827 by Rolene Arbour, RN Outcome: Not Progressing   Problem: Health Behavior/Discharge Planning: Goal: Ability to manage health-related needs will improve 05/03/2019 1827 by Rolene Arbour, RN Outcome: Not Progressing   Problem: Clinical Measurements: Goal: Ability to maintain clinical measurements within normal limits will improve 05/03/2019 1827 by Rolene Arbour, RN Outcome: Not Progressing   Problem: Clinical Measurements: Goal: Will remain free from infection 05/03/2019 1827 by Rolene Arbour, RN Outcome: Not Progressing   Problem: Clinical Measurements: Goal: Diagnostic test results will improve 05/03/2019 1827 by Rolene Arbour, RN Outcome: Not Progressing   Problem: Clinical Measurements: Goal: Respiratory complications will improve 05/03/2019 1827 by Rolene Arbour, RN Outcome: Not Progressing   Problem: Clinical Measurements: Goal: Cardiovascular complication will be avoided 05/03/2019 1827 by Rolene Arbour, RN Outcome: Not Progressing   Problem: Nutrition: Goal: Adequate nutrition will be maintained 05/03/2019 1827 by Rolene Arbour, RN Outcome: Not Progressing   Problem: Coping: Goal: Level of anxiety will decrease 05/03/2019 1827 by Rolene Arbour, RN Outcome: Not Progressing   Problem: Elimination: Goal: Will not experience complications related to bowel motility 05/03/2019 1827 by Rolene Arbour, RN Outcome: Not Progressing

## 2019-05-03 NOTE — Progress Notes (Addendum)
Initial Nutrition Assessment  DOCUMENTATION CODES:   Not applicable  INTERVENTION:    Offer Ensure Enlive po TID, each supplement provides 350 kcal and 20 grams of protein   Pt receiving Hormel Shake daily with Breakfast which provides 520 kcals and 22 g of protein and Magic cup BID with lunch and dinner, each supplement provides 290 kcal and 9 grams of protein, automatically on meal trays to optimize nutritional intake.   NUTRITION DIAGNOSIS:   Increased nutrient needs related to acute illness(COVID) as evidenced by estimated needs.  GOAL:   Patient will meet greater than or equal to 90% of their needs  MONITOR:   PO intake, Supplement acceptance  REASON FOR ASSESSMENT:   Malnutrition Screening Tool    ASSESSMENT:   86 yo female admitted with SOB r/t COVID-19 PNA. COVID positive 1/5. PMH includes HTN, HLD, glaucoma, SAH, legally blind.   Patient is consuming 0% of meals.    Patient is at increased nutrition risk, she would benefit from PO supplements to ensure adequate intake to meet increased nutrition needs for COVID-19, however, suspect she may not take them since intake of meals is 0%. She refused the Ensure supplement offered by the RN this morning.   Per MD discussion with family 1/19, plans to continue medical care for now and if she declines or is suffering will transition to comfort measures.   Weight on admission 63.5 kg, currently 61.3 kg 3.5% weight loss within a week is significant for the time frame.  I/O + 1.6 L since admission.  NUTRITION - FOCUSED PHYSICAL EXAM:  unable to complete due to COVID restrictions   Diet Order:   Diet Order            Diet Heart Room service appropriate? Yes; Fluid consistency: Thin  Diet effective now              EDUCATION NEEDS:   Not appropriate for education at this time  Skin:  Skin Assessment: Reviewed RN Assessment  Last BM:  1/15  Height:   Ht Readings from Last 1 Encounters:  05/12/2019 5\' 5"   (1.651 m)    Weight:   Wt Readings from Last 1 Encounters:  04/21/2019 61.3 kg    Ideal Body Weight:  56.8 kg  BMI:  Body mass index is 22.48 kg/m.  Estimated Nutritional Needs:   Kcal:  1700-1850  Protein:  90-100 gm  Fluid:  >/= 1.8 L    Molli Barrows, RD, LDN, [place] Pager [phone] After Hours Pager [phone]

## 2019-05-03 NOTE — Plan of Care (Signed)

## 2019-05-03 NOTE — Progress Notes (Signed)
Physical Therapy Treatment Patient Details Name: Mary Trevino MRN: [id number] DOB: 11-21-1926 Today's Date: 05/03/2019    History of Present Illness Very frail 86 year old female admitted with Acute respiratory failure with hypoxia; PMH includes HTN, HLD, legal blindness, mild HOH. Has had corneal implants (2014/2016)    PT Comments    Very sleepy, flat affect, responds to name. Refuses any efforts to encourage out of bed activity, but did agree to simple ex with LEs (A, AA as detailed) and use of IS and Flutter valve. She remains very weak, poor endurance, and on HF per [place] as detailed  Follow Up Recommendations  SNF(pending on any progression here.)     Equipment Recommendations       Recommendations for Other Services       Precautions / Restrictions Precautions Precautions: Fall Precaution Comments: desats; at risk for skin breakdown Restrictions Weight Bearing Restrictions: No Other Position/Activity Restrictions: She is very frail and requires a great deal of encouragement to change positions    Mobility  Bed Mobility Overal bed mobility: Needs Assistance Bed Mobility: Rolling Rolling: Mod assist;Max assist;+2 for physical assistance         General bed mobility comments: pt assisted with bed mobility and able to bridge to remove bedpan(refused any OOB activity)  Transfers                    Ambulation/Gait                 Stairs             Wheelchair Mobility    Modified Rankin (Stroke Patients Only)       Balance                                            Cognition Arousal/Alertness: Lethargic Behavior During Therapy: Flat affect                                   General Comments: cognition most liely at baseline; will further assess      Exercises General Exercises - Lower Extremity Ankle Circles/Pumps: AROM;AAROM;Supine Heel Slides: AROM;AAROM;Supine Hip ABduction/ADduction:  AROM;AAROM;Supine Other Exercises Other Exercises: Practiced IS and able to achieve 750  for 8 out of 10 repetitions. Reminded to use every hour and it was placed on her bedside table. She is legally blind but she states that she sees the unit, and did have good return demo.    General Comments General comments (skin integrity, edema, etc.): Skin, poor tone and turgor, and prone to skin tears and bruising.      Pertinent Vitals/Pain Pain Assessment: 0-10 Pain Score: 4  Pain Location: abdomen; with mobility Pain Descriptors / Indicators: Moaning;Aching    Home Living Family/patient expects to be discharged to:: Private residence   Available Help at Discharge: Family   Home Access: Stairs to enter            Prior Function            PT Goals (current goals can now be found in the care plan section) Acute Rehab PT Goals Patient Stated Goal: Just want to get well and go home with my family PT Goal Formulation: With patient Time For Goal Achievement: 05/10/19 Potential to Achieve Goals: Fair Progress towards  PT goals: Not progressing toward goals - comment    Frequency    Min 3X/week      PT Plan      Co-evaluation              AM-PAC PT "6 Clicks" Mobility   Outcome Measure  Help needed turning from your back to your side while in a flat bed without using bedrails?: A Lot Help needed moving from lying on your back to sitting on the side of a flat bed without using bedrails?: A Lot Help needed moving to and from a bed to a chair (including a wheelchair)?: A Lot Help needed standing up from a chair using your arms (e.g., wheelchair or bedside chair)?: A Lot Help needed to walk in hospital room?: A Lot Help needed climbing 3-5 steps with a railing? : Total 6 Click Score: 11    End of Session   Activity Tolerance: Patient limited by fatigue Patient left: in bed;with call bell/phone within reach   PT Visit Diagnosis: Muscle weakness (generalized)  (M62.81)     Time: [id number] PT Time Calculation (min) (ACUTE ONLY): 25 min  Charges:  $Therapeutic Exercise: 23-37 mins                    Rollen Sox, PT # [phone] CGV cell   Casandra Doffing 05/03/2019, 5:12 PM

## 2019-05-03 NOTE — Plan of Care (Signed)
  Problem: Education: Goal: Knowledge of General Education information will improve Description: Including pain rating scale, medication(s)/side effects and non-pharmacologic comfort measures 05/03/2019 0155 by Lovenia Kim, RN Outcome: Progressing 05/03/2019 0115 by Lovenia Kim, RN Outcome: Progressing   Problem: Health Behavior/Discharge Planning: Goal: Ability to manage health-related needs will improve 05/03/2019 0155 by Lovenia Kim, RN Outcome: Progressing 05/03/2019 0115 by Lovenia Kim, RN Outcome: Progressing   Problem: Clinical Measurements: Goal: Ability to maintain clinical measurements within normal limits will improve 05/03/2019 0155 by Lovenia Kim, RN Outcome: Progressing 05/03/2019 0115 by Lovenia Kim, RN Outcome: Progressing Goal: Will remain free from infection 05/03/2019 0155 by Lovenia Kim, RN Outcome: Progressing 05/03/2019 0115 by Lovenia Kim, RN Outcome: Progressing Goal: Diagnostic test results will improve 05/03/2019 0155 by Lovenia Kim, RN Outcome: Progressing 05/03/2019 0115 by Lovenia Kim, RN Outcome: Progressing Goal: Respiratory complications will improve 05/03/2019 0155 by Lovenia Kim, RN Outcome: Progressing 05/03/2019 0115 by Lovenia Kim, RN Outcome: Progressing Goal: Cardiovascular complication will be avoided 05/03/2019 0155 by Lovenia Kim, RN Outcome: Progressing 05/03/2019 0115 by Lovenia Kim, RN Outcome: Progressing   Problem: Activity: Goal: Risk for activity intolerance will decrease 05/03/2019 0155 by Lovenia Kim, RN Outcome: Progressing 05/03/2019 0115 by Lovenia Kim, RN Outcome: Progressing   Problem: Nutrition: Goal: Adequate nutrition will be maintained 05/03/2019 0155 by Lovenia Kim, RN Outcome: Progressing 05/03/2019 0115 by Lovenia Kim, RN Outcome: Progressing   Problem: Coping: Goal: Level  of anxiety will decrease 05/03/2019 0155 by Lovenia Kim, RN Outcome: Progressing 05/03/2019 0115 by Lovenia Kim, RN Outcome: Progressing   Problem: Elimination: Goal: Will not experience complications related to bowel motility 05/03/2019 0155 by Lovenia Kim, RN Outcome: Progressing 05/03/2019 0115 by Lovenia Kim, RN Outcome: Progressing Goal: Will not experience complications related to urinary retention 05/03/2019 0155 by Lovenia Kim, RN Outcome: Progressing 05/03/2019 0115 by Lovenia Kim, RN Outcome: Progressing   Problem: Pain Managment: Goal: General experience of comfort will improve 05/03/2019 0155 by Lovenia Kim, RN Outcome: Progressing 05/03/2019 0115 by Lovenia Kim, RN Outcome: Progressing   Problem: Safety: Goal: Ability to remain free from injury will improve 05/03/2019 0155 by Lovenia Kim, RN Outcome: Progressing 05/03/2019 0115 by Lovenia Kim, RN Outcome: Progressing   Problem: Skin Integrity: Goal: Risk for impaired skin integrity will decrease 05/03/2019 0155 by Lovenia Kim, RN Outcome: Progressing 05/03/2019 0115 by Lovenia Kim, RN Outcome: Progressing

## 2019-05-04 LAB — CBC WITH DIFFERENTIAL/PLATELET
Abs Immature Granulocytes: 1.16 10*3/uL — ABNORMAL HIGH (ref 0.00–0.07)
Basophils Absolute: 0.1 10*3/uL (ref 0.0–0.1)
Basophils Relative: 1 %
Eosinophils Absolute: 0 10*3/uL (ref 0.0–0.5)
Eosinophils Relative: 0 %
HCT: 52.1 % — ABNORMAL HIGH (ref 36.0–46.0)
Hemoglobin: 17.1 g/dL — ABNORMAL HIGH (ref 12.0–15.0)
Immature Granulocytes: 5 %
Lymphocytes Relative: 4 %
Lymphs Abs: 1 10*3/uL (ref 0.7–4.0)
MCH: 28 pg (ref 26.0–34.0)
MCHC: 32.8 g/dL (ref 30.0–36.0)
MCV: 85.3 fL (ref 80.0–100.0)
Monocytes Absolute: 1.2 10*3/uL — ABNORMAL HIGH (ref 0.1–1.0)
Monocytes Relative: 5 %
Neutro Abs: 19.8 10*3/uL — ABNORMAL HIGH (ref 1.7–7.7)
Neutrophils Relative %: 85 %
Platelets: 315 10*3/uL (ref 150–400)
RBC: 6.11 MIL/uL — ABNORMAL HIGH (ref 3.87–5.11)
RDW: 15.2 % (ref 11.5–15.5)
WBC: 23.2 10*3/uL — ABNORMAL HIGH (ref 4.0–10.5)
nRBC: 0.4 % — ABNORMAL HIGH (ref 0.0–0.2)

## 2019-05-04 LAB — COMPREHENSIVE METABOLIC PANEL
ALT: 42 U/L (ref 0–44)
AST: 23 U/L (ref 15–41)
Albumin: 2.8 g/dL — ABNORMAL LOW (ref 3.5–5.0)
Alkaline Phosphatase: 69 U/L (ref 38–126)
Anion gap: 16 — ABNORMAL HIGH (ref 5–15)
BUN: 86 mg/dL — ABNORMAL HIGH (ref 8–23)
CO2: 25 mmol/L (ref 22–32)
Calcium: 9.4 mg/dL (ref 8.9–10.3)
Chloride: 107 mmol/L (ref 98–111)
Creatinine, Ser: 1.91 mg/dL — ABNORMAL HIGH (ref 0.44–1.00)
GFR calc Af Amer: 26 mL/min — ABNORMAL LOW (ref >60)
GFR calc non Af Amer: 23 mL/min — ABNORMAL LOW (ref >60)
Glucose, Bld: 146 mg/dL — ABNORMAL HIGH (ref 70–99)
Potassium: 4.9 mmol/L (ref 3.5–5.1)
Sodium: 148 mmol/L — ABNORMAL HIGH (ref 135–145)
Total Bilirubin: 0.9 mg/dL (ref 0.3–1.2)
Total Protein: 6.1 g/dL — ABNORMAL LOW (ref 6.5–8.1)

## 2019-05-04 LAB — GLUCOSE, CAPILLARY
Glucose-Capillary: 162 mg/dL — ABNORMAL HIGH (ref 70–99)
Glucose-Capillary: 235 mg/dL — ABNORMAL HIGH (ref 70–99)
Glucose-Capillary: 238 mg/dL — ABNORMAL HIGH (ref 70–99)
Glucose-Capillary: 220 mg/dL — ABNORMAL HIGH (ref 70–99)

## 2019-05-04 LAB — MAGNESIUM: Magnesium: 2.9 mg/dL — ABNORMAL HIGH (ref 1.7–2.4)

## 2019-05-04 LAB — BRAIN NATRIURETIC PEPTIDE: B Natriuretic Peptide: 374.7 pg/mL — ABNORMAL HIGH (ref 0.0–100.0)

## 2019-05-04 LAB — D-DIMER, QUANTITATIVE: D-Dimer, Quant: 2.07 ug/mL-FEU — ABNORMAL HIGH (ref 0.00–0.50)

## 2019-05-04 LAB — C-REACTIVE PROTEIN: CRP: 0.9 mg/dL (ref <1.0)

## 2019-05-04 MED ORDER — METHYLPREDNISOLONE SODIUM SUCC 40 MG IJ SOLR
10.0000 mg | Freq: Every day | INTRAMUSCULAR | Status: DC
  Administered 2019-05-04 – 2019-05-06 (×3): 10 mg via INTRAVENOUS
  Filled 2019-05-04 (×3): qty 1

## 2019-05-04 MED ORDER — METOPROLOL SUCCINATE ER 25 MG PO TB24
50.0000 mg | ORAL_TABLET | Freq: Every day | ORAL | Status: DC
  Administered 2019-05-05: 08:00:00 50 mg via ORAL
  Filled 2019-05-04: qty 2

## 2019-05-04 MED ORDER — DEXTROSE 5 % IV SOLN: INTRAVENOUS | Status: DC

## 2019-05-04 NOTE — Plan of Care (Signed)
  Problem: Clinical Measurements: Goal: Respiratory complications will improve Outcome: Progressing   Problem: Pain Managment: Goal: General experience of comfort will improve Outcome: Progressing   Problem: Safety: Goal: Ability to remain free from injury will improve Outcome: Progressing   Problem: Skin Integrity: Goal: Risk for impaired skin integrity will decrease Outcome: Progressing   

## 2019-05-04 NOTE — Progress Notes (Signed)
PROGRESS NOTE                                                                                                                                                                                                             Patient Demographics:    Mary Trevino, is a 86 y.o. female, DOB - 1926/06/24, QVO:[id number]  Outpatient Primary MD for the patient is Debbrah Alar, NP   Admit date - 05/03/2019   LOS - 9  Chief Complaint  Patient presents with  . Shortness of Breath       Brief Narrative: Patient is a 86 y.o. female with PMHx of HTN, HLD, glaucoma, legal blindness, history of corneal transplantation who presented as a transfer from [facility] on 1/13 for evaluation of severe hypoxemia secondary to COVID-19 pneumonia.    She was diagnosed with COVID-19 on 1/5 and was isolating at home.  COVID-19 medications: Steroids: 1/13>> Remdesivir: 1/13>> Actemra: 1/13 x 1   Subjective:   Patient in bed, appears comfortable, denies any headache, no fever, no chest pain or pressure, no shortness of breath , no abdominal pain. No focal weakness.    Assessment  & Plan :   Acute Hypoxic Resp Failure due to Covid 19 Viral pneumonia: She severe disease and was treated with full scope of treatment with IV steroids, Actemra and remdesivir.    Note patient looks much better clinically, steroids being tapered, her pulse ox probe is extremely positional, spent about 20 minutes bedside on 05/03/2019 with the patient's nurse, was able to be titrated down to 5 L from 12 L, VBG confirms it.  She is currently asymptomatic on 4-5 L and will be left there.  Her fingernails have thick gel nail polish on it preventing proper read.  Earlobes are extremely poor perfused as well.  Encouraged to sit up in chair in the daytime use I-S and flutter valve for pulmonary toiletry and prone in bed at night.  Spoke with the patient's daughter  on 1/17, spoke with daughter again on 05/01/2019.  Plan is to continue medical care.  If she declines or is suffering then focus on comfort measures/hospice.    COVID-19 Labs: Recent Labs    05/02/19 0208 05/03/19 0225 05/04/19 0139  DDIMER 1.58* 2.16* 2.07*  CRP 1.1* 0.9 0.9  Component Value Date/Time   BNP 374.7 (H) 05/04/2019 0139    Recent Labs  Lab 05/01/19 0607 05/02/19 0208 05/03/19 0225  PROCALCITON <0.10 <0.10 0.19    No results found for: SARSCOV2NAA   Hepatic Function Latest Ref Rng & Units 05/04/2019 05/03/2019 05/02/2019  Total Protein 6.5 - 8.1 g/dL 6.1(L) 6.6 6.2(L)  Albumin 3.5 - 5.0 g/dL 2.8(L) 2.9(L) 2.8(L)  AST 15 - 41 U/L '23 27 30  '$ ALT 0 - 44 U/L 42 58(H) 68(H)  Alk Phosphatase 38 - 126 U/L 69 65 52  Total Bilirubin 0.3 - 1.2 mg/dL 0.9 1.1 0.9  Bilirubin, Direct 0.0 - 0.3 mg/dL - - -    Transaminitis: Mild asymptomatic COVID-19 viral infection related transaminitis, stable trend.  AKI with hypernatremia due to dehydration.  Gentle D5W on 05/04/2019.  NSVT: Dramatic with stable magnesium, doubled beta-blocker.  65 to 70% in mid 2017 , no further work-up.  HTN: BP stable, Norvasc discontinued to double beta-blocker for above.  HLD: Continue statin  History of glaucoma/corneal transplantation/legal blindness: On usual home regimen of eyedrops.  Mild dehydration with hypernatremia.  Resolved after IV fluids.    Goals of care: DNR in place-plans are to continue with supportive care-to see if patient will improve in the next few days.  I had a long discussion with the patient's daughter on 1/16-while we wait and continue to hope she will improve-family is well aware that if patient deteriorates any further-apart from hospice care-we really will not have any options.  Consults  :  None  Procedures  :  None   Condition - Stable  Family Communication  : Spoke with the patient's daughter on 1/17, spoke with daughter again on 05/01/2019.  Plan is  to continue medical care.  If she declines or is suffering then focus on comfort measures/hospice.  Code Status :  DNR  Diet :  Diet Order            Diet Heart Room service appropriate? Yes; Fluid consistency: Thin  Diet effective now               Disposition Plan  : SNF  If stable medically on 05/05/2019.  Still hypoxic and dehydrated with hypernatremia requiring IV fluids.  Barriers to discharge: Hypoxia requiring O2 supplementation   Antimicorbials  :    Anti-infectives (From admission, onward)   Start     Dose/Rate Route Frequency Ordered Stop   04/26/19 1000  remdesivir 100 mg in sodium chloride 0.9 % 100 mL IVPB     100 mg 200 mL/hr over 30 Minutes Intravenous Daily 05/11/2019 1500 04/29/19 0937   05/04/2019 1500  remdesivir 100 mg in sodium chloride 0.9 % 100 mL IVPB     100 mg 200 mL/hr over 30 Minutes Intravenous Every 30 min 04/27/2019 1451 05/07/2019 1614   05/03/2019 1345  cefTRIAXone (ROCEPHIN) 1 g in sodium chloride 0.9 % 100 mL IVPB     1 g 200 mL/hr over 30 Minutes Intravenous  Once 05/11/2019 1343 04/24/2019 1450   05/10/2019 1345  azithromycin (ZITHROMAX) 500 mg in sodium chloride 0.9 % 250 mL IVPB     500 mg 250 mL/hr over 60 Minutes Intravenous  Once 05/03/2019 1343 05/13/2019 1514     DVT Prophylaxis  :  Lovenox   Inpatient Medications  Scheduled Meds: . vitamin C  500 mg Oral Daily  . atorvastatin  10 mg Oral Daily  . brimonidine  1 drop Both Eyes BID  And  . timolol  1 drop Both Eyes BID  . enoxaparin (LOVENOX) injection  40 mg Subcutaneous Q24H  . escitalopram  10 mg Oral Daily  . feeding supplement (ENSURE ENLIVE)  237 mL Oral TID BM  . insulin aspart  0-15 Units Subcutaneous TID WC  . insulin aspart  0-5 Units Subcutaneous QHS  . insulin aspart  2 Units Subcutaneous TID WC  . loteprednol  1 drop Right Eye QID  . methylPREDNISolone (SOLU-MEDROL) injection  10 mg Intravenous Daily  . [START ON 05/05/2019] metoprolol succinate  50 mg Oral Daily  .  nortriptyline  20 mg Oral QHS  . pantoprazole  40 mg Oral Daily  . prednisoLONE acetate  1 drop Left Eye BID  . sodium chloride flush  3 mL Intravenous Q12H  . zinc sulfate  220 mg Oral Daily   Continuous Infusions: . dextrose 125 mL/hr at 05/04/19 0823   PRN Meds:.acetaminophen, albuterol, chlorpheniramine-HYDROcodone, guaiFENesin-dextromethorphan, haloperidol lactate, loperamide, [DISCONTINUED] ondansetron **OR** ondansetron (ZOFRAN) IV, polyethylene glycol   Time Spent in minutes 35   See all Orders from today for further details   Lala Lund M.D on 05/04/2019 at 9:33 AM  To page go to [email or web address] - use universal password  [facility] -  Office  [phone]    Objective:   Vitals:   05/04/19 0600 05/04/19 0722 05/04/19 0738 05/04/19 0747  BP:  (!) 144/120 108/71   Pulse:  (!) 107    Resp:  18    Temp:  (!) 97.5 F (36.4 C)    TempSrc:  Axillary    SpO2: 91%   (!) 88%  Weight:      Height:        Wt Readings from Last 3 Encounters:  05/09/2019 61.3 kg  04/17/19 63.5 kg  08/09/18 65 kg     Intake/Output Summary (Last 24 hours) at 05/04/2019 0933 Last data filed at 05/03/2019 1800 Gross per 24 hour  Intake 330 ml  Output 600 ml  Net -270 ml     Physical Exam  Awake, mildly confused but in no distress, moves all 4 extremities to commands, No new F.N deficits,   [place].AT,PERRAL Supple Neck,No JVD, No cervical lymphadenopathy appriciated.  Symmetrical Chest wall movement, Good air movement bilaterally, CTAB RRR,No Gallops, Rubs or new Murmurs, No Parasternal Heave +ve B.Sounds, Abd Soft, No tenderness, No organomegaly appriciated, No rebound - guarding or rigidity. No Cyanosis, Clubbing or edema, No new Rash or bruise    Data Review:    CBC Recent Labs  Lab 04/30/19 0156 04/30/19 0156 05/01/19 0543 05/02/19 0208 05/03/19 0225 05/03/19 0932 05/04/19 0139  WBC 18.6*  --  15.8* 16.4* 20.0*  --  23.2*  HGB 14.7   < > 16.1* 17.1* 18.7*  17.0* 17.1*  HCT 46.3*   < > 49.4* 51.5* 56.2* 50.0* 52.1*  PLT 372  --  335 334 301  --  315  MCV 85.0  --  84.6 84.4 85.0  --  85.3  MCH 27.0  --  27.6 28.0 28.3  --  28.0  MCHC 31.7  --  32.6 33.2 33.3  --  32.8  RDW 14.6  --  14.8 14.6 15.9*  --  15.2  LYMPHSABS 0.8  --  0.9 0.7 1.0  --  1.0  MONOABS 1.2*  --  1.1* 0.9 1.0  --  1.2*  EOSABS 0.0  --  0.0 0.0 0.0  --  0.0  BASOSABS 0.2*  --  0.1 0.1 0.2*  --  0.1   < > = values in this interval not displayed.    Chemistries  Recent Labs  Lab 04/30/19 0156 04/30/19 0156 05/01/19 0543 05/02/19 0208 05/03/19 0225 05/03/19 0932 05/04/19 0139  NA 140   < > 143 143 148* 147* 148*  K 4.5   < > 4.7 4.7 4.9 4.2 4.9  CL 105  --  108 106 107  --  107  CO2 25  --  '25 26 26  '$ --  25  GLUCOSE 115*  --  106* 141* 112*  --  146*  BUN 40*  --  43* 50* 58*  --  86*  CREATININE 1.00  --  1.03* 1.21* 1.33*  --  1.91*  CALCIUM 9.2  --  9.4 9.3 9.6  --  9.4  MG 2.4  --  2.4 2.5* 2.8*  --  2.9*  AST 30  --  36 30 27  --  23  ALT 43  --  63* 68* 58*  --  42  ALKPHOS 43  --  50 52 65  --  69  BILITOT 0.5  --  0.6 0.9 1.1  --  0.9   < > = values in this interval not displayed.   ------------------------------------------------------------------------------------------------------------------ No results for input(s): CHOL, HDL, LDLCALC, TRIG, CHOLHDL, LDLDIRECT in the last 72 hours.  Lab Results  Component Value Date   HGBA1C 6.1 (H) 04/26/2019   ------------------------------------------------------------------------------------------------------------------ No results for input(s): TSH, T4TOTAL, T3FREE, THYROIDAB in the last 72 hours.  Invalid input(s): FREET3 ------------------------------------------------------------------------------------------------------------------ No results for input(s): VITAMINB12, FOLATE, FERRITIN, TIBC, IRON, RETICCTPCT in the last 72 hours.  Coagulation profile No results for input(s): INR, PROTIME in the  last 168 hours.  Recent Labs    05/03/19 0225 05/04/19 0139  DDIMER 2.16* 2.07*    Cardiac Enzymes No results for input(s): CKMB, TROPONINI, MYOGLOBIN in the last 168 hours.  Invalid input(s): CK ------------------------------------------------------------------------------------------------------------------    Component Value Date/Time   BNP 374.7 (H) 05/04/2019 0139    Micro Results Recent Results (from the past 240 hour(s))  Blood Culture (routine x 2)     Status: None   Collection Time: 04/27/2019  1:03 PM   Specimen: Right Antecubital; Blood  Result Value Ref Range Status   Specimen Description   Final    RIGHT ANTECUBITAL Performed at [facility], [address] [place], [place] [address]    Special Requests   Final    BOTTLES DRAWN AEROBIC AND ANAEROBIC Blood Culture results may not be optimal due to an inadequate volume of blood received in culture bottles Performed at [facility], [address] [place], [place] [address]    Culture   Final    NO GROWTH 5 DAYS Performed at [facility], [place] [address] [place], [place] [address]    Report Status 04/30/2019 FINAL  Final  Blood Culture (routine x 2)     Status: None   Collection Time: 04/14/2019  1:18 PM   Specimen: BLOOD LEFT HAND  Result Value Ref Range Status   Specimen Description   Final    BLOOD LEFT HAND Performed at [facility], [address] [place], [place] [address]    Special Requests   Final    BOTTLES DRAWN AEROBIC AND ANAEROBIC Blood Culture results may not be optimal due to an inadequate volume of blood received in culture bottles Performed at [facility], [address] [place], [place] [address]    Culture   Final    NO GROWTH 5 DAYS Performed at [facility], [place] [address] [place], [place] [address]    Report Status 04/30/2019 FINAL  Final  C difficile quick scan w PCR reflex     Status: None   Collection Time: 04/29/2019  10:38 PM   Specimen: STOOL  Result Value Ref Range Status   C Diff antigen NEGATIVE NEGATIVE Final   C Diff toxin NEGATIVE NEGATIVE Final   C Diff interpretation No C. difficile detected.  Final    Comment: Performed at [facility], [place] [address] [place], [place] [address]    Radiology Reports DG Chest Port 1 View  Result Date: 05/01/2019 CLINICAL DATA:  COVID-19 EXAM: PORTABLE CHEST 1 VIEW COMPARISON:  04/22/2019 FINDINGS: Persistent bilateral opacities. No pleural effusion or pneumothorax. Stable cardiomediastinal contours. IMPRESSION: Similar bilateral opacities compatible with COVID-19 pneumonia. Electronically Signed   By: Macy Mis M.D.   On: 05/01/2019 09:17   DG Chest Port 1 View  Result Date: 05/11/2019 CLINICAL DATA:  Brought in by EMS from home for SOB , COVID+, cough x 2 days EXAM: PORTABLE CHEST 1 VIEW COMPARISON:  04/17/2019 FINDINGS: There hazy airspace lung opacities predominantly in the mid to lower lungs, new or increased when compared to the prior exam. No convincing pleural effusion.  No pneumothorax. Cardiac silhouette is normal in size. No mediastinal or hilar masses. Skeletal structures are grossly intact. IMPRESSION: 1. Bilateral hazy airspace lung opacities have developed since prior study consistent multifocal pneumonia due to COVID-19 infection. Electronically Signed   By: Lajean Manes M.D.   On: 04/27/2019 13:39   DG Chest Portable 1 View  Result Date: 04/17/2019 CLINICAL DATA:  Cough and shortness of breath.  Hypertension. EXAM: PORTABLE CHEST 1 VIEW COMPARISON:  April 01, 2017 FINDINGS: There is no appreciable edema or consolidation. Heart is mildly enlarged with pulmonary vascularity normal. No adenopathy. Mild prominence of the aorta is stable. There is aortic atherosclerosis. There is degenerative change in the thoracic spine. No adenopathy evident. IMPRESSION: No edema or consolidation. Stable cardiac prominence. Prominence of the  aorta may reflect chronic hypertension and appears stable. Aortic Atherosclerosis (ICD10-I70.0). Electronically Signed   By: Lowella Grip III M.D.   On: 04/17/2019 13:02

## 2019-05-05 ENCOUNTER — Inpatient Hospital Stay (HOSPITAL_COMMUNITY): Payer: Medicare Other

## 2019-05-05 LAB — URINALYSIS, ROUTINE W REFLEX MICROSCOPIC
Bilirubin Urine: NEGATIVE
Glucose, UA: NEGATIVE mg/dL
Hgb urine dipstick: NEGATIVE
Ketones, ur: NEGATIVE mg/dL
Nitrite: NEGATIVE
Protein, ur: NEGATIVE mg/dL
Specific Gravity, Urine: 1.013 (ref 1.005–1.030)
pH: 5 (ref 5.0–8.0)

## 2019-05-05 LAB — COMPREHENSIVE METABOLIC PANEL
ALT: 32 U/L (ref 0–44)
AST: 32 U/L (ref 15–41)
Albumin: 2.6 g/dL — ABNORMAL LOW (ref 3.5–5.0)
Alkaline Phosphatase: 86 U/L (ref 38–126)
Anion gap: 18 — ABNORMAL HIGH (ref 5–15)
BUN: 98 mg/dL — ABNORMAL HIGH (ref 8–23)
CO2: 21 mmol/L — ABNORMAL LOW (ref 22–32)
Calcium: 8.5 mg/dL — ABNORMAL LOW (ref 8.9–10.3)
Chloride: 98 mmol/L (ref 98–111)
Creatinine, Ser: 2.19 mg/dL — ABNORMAL HIGH (ref 0.44–1.00)
GFR calc Af Amer: 22 mL/min — ABNORMAL LOW (ref >60)
GFR calc non Af Amer: 19 mL/min — ABNORMAL LOW (ref >60)
Glucose, Bld: 121 mg/dL — ABNORMAL HIGH (ref 70–99)
Potassium: 4.4 mmol/L (ref 3.5–5.1)
Sodium: 137 mmol/L (ref 135–145)
Total Bilirubin: 0.7 mg/dL (ref 0.3–1.2)
Total Protein: 5.3 g/dL — ABNORMAL LOW (ref 6.5–8.1)

## 2019-05-05 LAB — CBC WITH DIFFERENTIAL/PLATELET
Abs Immature Granulocytes: 1.88 10*3/uL — ABNORMAL HIGH (ref 0.00–0.07)
Basophils Absolute: 0.2 10*3/uL — ABNORMAL HIGH (ref 0.0–0.1)
Basophils Relative: 1 %
Eosinophils Absolute: 0 10*3/uL (ref 0.0–0.5)
Eosinophils Relative: 0 %
HCT: 52.4 % — ABNORMAL HIGH (ref 36.0–46.0)
Hemoglobin: 17.1 g/dL — ABNORMAL HIGH (ref 12.0–15.0)
Immature Granulocytes: 7 %
Lymphocytes Relative: 4 %
Lymphs Abs: 1.2 10*3/uL (ref 0.7–4.0)
MCH: 27.7 pg (ref 26.0–34.0)
MCHC: 32.6 g/dL (ref 30.0–36.0)
MCV: 84.8 fL (ref 80.0–100.0)
Monocytes Absolute: 1.4 10*3/uL — ABNORMAL HIGH (ref 0.1–1.0)
Monocytes Relative: 5 %
Neutro Abs: 24.3 10*3/uL — ABNORMAL HIGH (ref 1.7–7.7)
Neutrophils Relative %: 83 %
Platelets: 219 10*3/uL (ref 150–400)
RBC: 6.18 MIL/uL — ABNORMAL HIGH (ref 3.87–5.11)
RDW: 16 % — ABNORMAL HIGH (ref 11.5–15.5)
WBC: 29 10*3/uL — ABNORMAL HIGH (ref 4.0–10.5)
nRBC: 1.7 % — ABNORMAL HIGH (ref 0.0–0.2)

## 2019-05-05 LAB — GLUCOSE, CAPILLARY: Glucose-Capillary: 175 mg/dL — ABNORMAL HIGH (ref 70–99)

## 2019-05-05 LAB — PROCALCITONIN: Procalcitonin: 0.24 ng/mL

## 2019-05-05 LAB — BRAIN NATRIURETIC PEPTIDE: B Natriuretic Peptide: 145.5 pg/mL — ABNORMAL HIGH (ref 0.0–100.0)

## 2019-05-05 MED ORDER — LORAZEPAM 2 MG/ML IJ SOLN
1.0000 mg | Freq: Four times a day (QID) | INTRAMUSCULAR | Status: AC | PRN
  Administered 2019-05-06: 01:00:00 1 mg via INTRAVENOUS
  Filled 2019-05-05: qty 1

## 2019-05-05 MED ORDER — FUROSEMIDE 10 MG/ML IJ SOLN
40.0000 mg | Freq: Once | INTRAMUSCULAR | Status: AC
  Administered 2019-05-05: 16:00:00 40 mg via INTRAVENOUS

## 2019-05-05 MED ORDER — SODIUM CHLORIDE 0.9 % IV SOLN
1.0000 g | INTRAVENOUS | Status: DC
  Administered 2019-05-05: 1 g via INTRAVENOUS
  Filled 2019-05-05: qty 10
  Filled 2019-05-05: qty 1

## 2019-05-05 MED ORDER — TAMSULOSIN HCL 0.4 MG PO CAPS
0.4000 mg | ORAL_CAPSULE | Freq: Every day | ORAL | Status: DC
  Administered 2019-05-05: 12:00:00 0.4 mg via ORAL

## 2019-05-05 MED ORDER — LACTATED RINGERS IV SOLN: INTRAVENOUS | Status: DC

## 2019-05-05 MED ORDER — METRONIDAZOLE IN NACL 5-0.79 MG/ML-% IV SOLN
500.0000 mg | Freq: Three times a day (TID) | INTRAVENOUS | Status: DC
  Administered 2019-05-05 – 2019-05-06 (×3): 500 mg via INTRAVENOUS
  Filled 2019-05-05 (×5): qty 100

## 2019-05-05 MED ORDER — MORPHINE SULFATE (PF) 2 MG/ML IV SOLN
2.0000 mg | INTRAVENOUS | Status: DC | PRN
  Administered 2019-05-05 – 2019-05-06 (×3): 2 mg via INTRAVENOUS
  Filled 2019-05-05 (×3): qty 1

## 2019-05-05 NOTE — Progress Notes (Signed)
Shift summary - Pt resting quietly, comfortable. Allows for limited turning and positioning, teaching done r/t this subject. O2 at 4L [place], dry cough, lungs dim. Pt with purewick patent, and draining clear ylw urine. BM slast evening. Pt has IVF at 125 (D5%). Report to be given at window/bed side. Pt stable at time of this RN departure from 3rd floor

## 2019-05-05 NOTE — Plan of Care (Signed)
  Problem: Elimination: Goal: Will not experience complications related to urinary retention Outcome: Progressing   Problem: Pain Managment: Goal: General experience of comfort will improve Outcome: Progressing   Problem: Activity: Goal: Risk for activity intolerance will decrease Outcome: Not Progressing   Problem: Nutrition: Goal: Adequate nutrition will be maintained Outcome: Not Progressing   Problem: Coping: Goal: Level of anxiety will decrease Outcome: Not Progressing   Problem: Elimination: Goal: Will not experience complications related to bowel motility Outcome: Not Progressing   Problem: Skin Integrity: Goal: Risk for impaired skin integrity will decrease Outcome: Not Progressing

## 2019-05-05 NOTE — Progress Notes (Signed)
Pt continues to make very little to no urine, bladder scan earlier showed 166ml. Spoke to Dr Vanita Ingles at start of shift regarding report from day shift stating pt had made no urine that shift, he advised this RN to perform bladder scanner and if >472ml, to straight cath pt, the amt when scanned was as above. @this  time 0211, attempt to bladder scan pt, as no urine produced since last scan, pt agitated, aggressive w/RN care. Haldol as per orders given to allow for bladder scan to be done if possible, with less stress to pt,

## 2019-05-05 NOTE — Plan of Care (Signed)
  Problem: Pain Managment: Goal: General experience of comfort will improve Outcome: Progressing  Patient transitioned to [place] this shift. Family on board. Patient had increased work of breathing this shift with rectal temp of 95.8. Daughter to visit patient 24 jan at noon. Charge aware. RN will need to escort patient down to floor 1. Patient's work of breathing and pain managed with PRN's effectively. Per MD, [facility] to stop on 01/24 if no signs of clinical improvement.

## 2019-05-05 NOTE — Progress Notes (Signed)
Rapid Called around 1400 d/t patient with hypothermic rectal temp and increased WOB. Decision to make patient CMO at this time.

## 2019-05-05 NOTE — Progress Notes (Signed)
Reassessed pt, bladder scan showed little urine, pt incontinent x 2 (large fluid amts) of clear yellow urine, replaced purewick (with brief) for patentcy. Will continue to Monitor output.

## 2019-05-05 NOTE — Progress Notes (Addendum)
PROGRESS NOTE                                                                                                                                                                                                             Patient Demographics:    Mary Trevino, is a 86 y.o. female, DOB - 04/24/1926, BFX:[id number]  Outpatient Primary MD for the patient is Debbrah Alar, NP   Admit date - 05/11/2019   LOS - 10  Chief Complaint  Patient presents with  . Shortness of Breath       Brief Narrative: Patient is a 86 y.o. female with PMHx of HTN, HLD, glaucoma, legal blindness, history of corneal transplantation who presented as a transfer from [employer] on 1/13 for evaluation of severe hypoxemia secondary to COVID-19 pneumonia.    She was diagnosed with COVID-19 on 1/5 and was isolating at home.  COVID-19 medications: Steroids: 1/13>> Remdesivir: 1/13>> Actemra: 1/13 x 1   Subjective:   Patient in bed she appears to be in no distress but somewhat confused, not answering questions reliably.   Assessment  & Plan :   Acute Hypoxic Resp Failure due to Covid 19 Viral pneumonia: She severe disease and was treated with full scope of treatment with IV steroids, Actemra and remdesivir.    Hypoxia standpoint she is improving steroids being tapered, currently on 3 to 4 L nasal cannula oxygen and appears to be in no distress.  Encouraged to sit up in chair in the daytime use I-S and flutter valve for pulmonary toiletry and prone in bed at night.  Spoke with the patient's daughter on 1/17, spoke with daughter again on 05/01/2019.  Plan is to continue medical care.  If she declines or is suffering then focus on comfort measures/hospice.  Addendum.  Around 3 PM patient became more short of breath according to nursing staff, now labored breathing, most likely mediastinitis getting worse along with her underlying condition.   Spoke to daughter again on 05/05/2019 at 3 PM.  Will transition to comfort measures.  Will continue gentle medical treatment, if further Versed morphine drip.   COVID-19 Labs: Recent Labs    05/03/19 0225 05/04/19 0139  DDIMER 2.16* 2.07*  CRP 0.9 0.9       Component Value Date/Time   BNP 374.7 (H) 05/04/2019 0139  Recent Labs  Lab 05/01/19 0607 05/02/19 0208 05/03/19 0225  PROCALCITON <0.10 <0.10 0.19    No results found for: SARSCOV2NAA   Hepatic Function Latest Ref Rng & Units 05/05/2019 05/04/2019 05/03/2019  Total Protein 6.5 - 8.1 g/dL 5.3(L) 6.1(L) 6.6  Albumin 3.5 - 5.0 g/dL 2.6(L) 2.8(L) 2.9(L)  AST 15 - 41 U/L 32 23 27  ALT 0 - 44 U/L 32 42 58(H)  Alk Phosphatase 38 - 126 U/L 86 69 65  Total Bilirubin 0.3 - 1.2 mg/dL 0.7 0.9 1.1  Bilirubin, Direct 0.0 - 0.3 mg/dL - - -    Transaminitis: Mild asymptomatic COVID-19 viral infection related transaminitis, resolved  AKI with hypernatremia due to dehydration.  Continue hydration and monitor, will check random bladder scan on 05/05/2019 as well.  Worsening leukocytosis.  Work-up showed possible UTI along with pneumomediastinum and pneumothorax which are small.  Placed on Rocephin and Flagyl for now.  No pain hence do not think she has esophageal perforation.  Right-sided pneumothorax with pneumomediastinum.  Likely due to Covid.  Will monitor.  Currently in no distress.  Urinary retention.  Foley and Flomax.    NSVT: Dramatic with stable magnesium, doubled beta-blocker.  65 to 70% in mid 2017 , no further work-up.  HTN: BP stable, Norvasc discontinued to double beta-blocker for above.  HLD: Continue statin  History of glaucoma/corneal transplantation/legal blindness: On usual home regimen of eyedrops.  Mild dehydration with hypernatremia.  Resolved after IV fluids.    Goals of care: DNR in place-plans are to continue with supportive care-to see if patient will improve in the next few days.  I had a long  discussion with the patient's daughter on 1/16-while we wait and continue to hope she will improve-family is well aware that if patient deteriorates any further-apart from hospice care-we really will not have any options.  Consults  :  None  Procedures  :  None   Condition - Stable  Family Communication  : Spoke with the patient's daughter on 1/17, spoke with daughter again on 05/01/2019.  Plan is to continue medical care.  If she declines or is suffering then focus on comfort measures/hospice.  Message left on cell phone on 05/05/2019 at 11:50 AM, discussed again with daughter on 05/05/2019 at 3 PM  Code Status :  DNR  Diet :  Diet Order            Diet Heart Room service appropriate? Yes; Fluid consistency: Thin  Diet effective now               Disposition Plan  : SNF  If stable medically on 05/05/2019.  Still hypoxic and dehydrated with hypernatremia requiring IV fluids.  Barriers to discharge: Hypoxia requiring O2 supplementation   Antimicorbials  :    Anti-infectives (From admission, onward)   Start     Dose/Rate Route Frequency Ordered Stop   04/26/19 1000  remdesivir 100 mg in sodium chloride 0.9 % 100 mL IVPB     100 mg 200 mL/hr over 30 Minutes Intravenous Daily 05/10/2019 1500 04/29/19 0937   04/29/2019 1500  remdesivir 100 mg in sodium chloride 0.9 % 100 mL IVPB     100 mg 200 mL/hr over 30 Minutes Intravenous Every 30 min 04/29/2019 1451 05/05/2019 1614   05/07/2019 1345  cefTRIAXone (ROCEPHIN) 1 g in sodium chloride 0.9 % 100 mL IVPB     1 g 200 mL/hr over 30 Minutes Intravenous  Once 04/23/2019 1343 05/10/2019 1450  04/29/2019 1345  azithromycin (ZITHROMAX) 500 mg in sodium chloride 0.9 % 250 mL IVPB     500 mg 250 mL/hr over 60 Minutes Intravenous  Once 04/21/2019 1343 04/20/2019 1514     DVT Prophylaxis  :  Lovenox   Inpatient Medications  Scheduled Meds: . vitamin C  500 mg Oral Daily  . atorvastatin  10 mg Oral Daily  . brimonidine  1 drop Both Eyes BID   And  .  timolol  1 drop Both Eyes BID  . enoxaparin (LOVENOX) injection  40 mg Subcutaneous Q24H  . escitalopram  10 mg Oral Daily  . feeding supplement (ENSURE ENLIVE)  237 mL Oral TID BM  . insulin aspart  0-15 Units Subcutaneous TID WC  . insulin aspart  0-5 Units Subcutaneous QHS  . insulin aspart  2 Units Subcutaneous TID WC  . loteprednol  1 drop Right Eye QID  . methylPREDNISolone (SOLU-MEDROL) injection  10 mg Intravenous Daily  . metoprolol succinate  50 mg Oral Daily  . nortriptyline  20 mg Oral QHS  . pantoprazole  40 mg Oral Daily  . prednisoLONE acetate  1 drop Left Eye BID  . sodium chloride flush  3 mL Intravenous Q12H  . zinc sulfate  220 mg Oral Daily   Continuous Infusions: . lactated ringers     PRN Meds:.acetaminophen, albuterol, chlorpheniramine-HYDROcodone, guaiFENesin-dextromethorphan, haloperidol lactate, loperamide, [DISCONTINUED] ondansetron **OR** ondansetron (ZOFRAN) IV, polyethylene glycol   Time Spent in minutes 35   See all Orders from today for further details   Lala Lund M.D on 05/05/2019 at 9:27 AM  To page go to [email or web address] - use universal password  [facility] -  Office  [phone]    Objective:   Vitals:   05/04/19 2009 05/05/19 0254 05/05/19 0522 05/05/19 0800  BP: 106/60 (!) 126/59 (!) 147/67 114/71  Pulse: 98  89 82  Resp: 16 (!) '27 18 17  '$ Temp: (!) 96.8 F (36 C)  98.4 F (36.9 C) 98.2 F (36.8 C)  TempSrc: Axillary  Oral Axillary  SpO2: 90%  94% 95%  Weight:      Height:        Wt Readings from Last 3 Encounters:  04/23/2019 61.3 kg  04/17/19 63.5 kg  08/09/18 65 kg     Intake/Output Summary (Last 24 hours) at 05/05/2019 0927 Last data filed at 05/04/2019 1322 Gross per 24 hour  Intake 0 ml  Output --  Net 0 ml     Physical Exam  Awake, mildly confused but in no distress, moves all 4 extremities to commands, No new F.N deficits,   Craig.AT,PERRAL Supple Neck,No JVD, No cervical lymphadenopathy appriciated.   Symmetrical Chest wall movement, Good air movement bilaterally, CTAB RRR,No Gallops, Rubs or new Murmurs, No Parasternal Heave +ve B.Sounds, Abd Soft, No tenderness, No organomegaly appriciated, No rebound - guarding or rigidity. No Cyanosis, Clubbing or edema, No new Rash or bruise    Data Review:    CBC Recent Labs  Lab 05/01/19 0543 05/01/19 0543 05/02/19 0208 05/03/19 0225 05/03/19 0932 05/04/19 0139 05/05/19 0510  WBC 15.8*  --  16.4* 20.0*  --  23.2* 29.0*  HGB 16.1*   < > 17.1* 18.7* 17.0* 17.1* 17.1*  HCT 49.4*   < > 51.5* 56.2* 50.0* 52.1* 52.4*  PLT 335  --  334 301  --  315 219  MCV 84.6  --  84.4 85.0  --  85.3 84.8  MCH 27.6  --  28.0  28.3  --  28.0 27.7  MCHC 32.6  --  33.2 33.3  --  32.8 32.6  RDW 14.8  --  14.6 15.9*  --  15.2 16.0*  LYMPHSABS 0.9  --  0.7 1.0  --  1.0 1.2  MONOABS 1.1*  --  0.9 1.0  --  1.2* 1.4*  EOSABS 0.0  --  0.0 0.0  --  0.0 0.0  BASOSABS 0.1  --  0.1 0.2*  --  0.1 0.2*   < > = values in this interval not displayed.    Chemistries  Recent Labs  Lab 04/30/19 0156 04/30/19 0156 05/01/19 0543 05/01/19 0543 05/02/19 0208 05/03/19 0225 05/03/19 0932 05/04/19 0139 05/05/19 0510  NA 140   < > 143   < > 143 148* 147* 148* 137  K 4.5   < > 4.7   < > 4.7 4.9 4.2 4.9 4.4  CL 105   < > 108  --  106 107  --  107 98  CO2 25   < > 25  --  26 26  --  25 21*  GLUCOSE 115*   < > 106*  --  141* 112*  --  146* 121*  BUN 40*   < > 43*  --  50* 58*  --  86* 98*  CREATININE 1.00   < > 1.03*  --  1.21* 1.33*  --  1.91* 2.19*  CALCIUM 9.2   < > 9.4  --  9.3 9.6  --  9.4 8.5*  MG 2.4  --  2.4  --  2.5* 2.8*  --  2.9*  --   AST 30   < > 36  --  30 27  --  23 32  ALT 43   < > 63*  --  68* 58*  --  42 32  ALKPHOS 43   < > 50  --  52 65  --  69 86  BILITOT 0.5   < > 0.6  --  0.9 1.1  --  0.9 0.7   < > = values in this interval not displayed.    ------------------------------------------------------------------------------------------------------------------ No results for input(s): CHOL, HDL, LDLCALC, TRIG, CHOLHDL, LDLDIRECT in the last 72 hours.  Lab Results  Component Value Date   HGBA1C 6.1 (H) 04/26/2019   ------------------------------------------------------------------------------------------------------------------ No results for input(s): TSH, T4TOTAL, T3FREE, THYROIDAB in the last 72 hours.  Invalid input(s): FREET3 ------------------------------------------------------------------------------------------------------------------ No results for input(s): VITAMINB12, FOLATE, FERRITIN, TIBC, IRON, RETICCTPCT in the last 72 hours.  Coagulation profile No results for input(s): INR, PROTIME in the last 168 hours.  Recent Labs    05/03/19 0225 05/04/19 0139  DDIMER 2.16* 2.07*    Cardiac Enzymes No results for input(s): CKMB, TROPONINI, MYOGLOBIN in the last 168 hours.  Invalid input(s): CK ------------------------------------------------------------------------------------------------------------------    Component Value Date/Time   BNP 374.7 (H) 05/04/2019 0139    Micro Results Recent Results (from the past 240 hour(s))  Blood Culture (routine x 2)     Status: None   Collection Time: 05/05/2019  1:03 PM   Specimen: Right Antecubital; Blood  Result Value Ref Range Status   Specimen Description   Final    RIGHT ANTECUBITAL Performed at [facility], [address] [place], [place] [address]    Special Requests   Final    BOTTLES DRAWN AEROBIC AND ANAEROBIC Blood Culture results may not be optimal due to an inadequate volume of blood received in culture  bottles Performed at [facility], [address] [place], [place] [address]    Culture   Final    NO GROWTH 5 DAYS Performed at [facility], [place] [address], [place], [place] [address]    Report Status 04/30/2019 FINAL   Final  Blood Culture (routine x 2)     Status: None   Collection Time: 05/12/2019  1:18 PM   Specimen: BLOOD LEFT HAND  Result Value Ref Range Status   Specimen Description   Final    BLOOD LEFT HAND Performed at [facility], [address] [place], [place] [address]    Special Requests   Final    BOTTLES DRAWN AEROBIC AND ANAEROBIC Blood Culture results may not be optimal due to an inadequate volume of blood received in culture bottles Performed at [facility], [address] [place], [place] [address]    Culture   Final    NO GROWTH 5 DAYS Performed at [facility], [place] [address], [place], [place] [address]    Report Status 04/30/2019 FINAL  Final  C difficile quick scan w PCR reflex     Status: None   Collection Time: 04/30/2019 10:38 PM   Specimen: STOOL  Result Value Ref Range Status   C Diff antigen NEGATIVE NEGATIVE Final   C Diff toxin NEGATIVE NEGATIVE Final   C Diff interpretation No C. difficile detected.  Final    Comment: Performed at [facility], [place] [address] [place], [place] [address]    Radiology Reports DG Chest Port 1 View  Result Date: 05/01/2019 CLINICAL DATA:  COVID-19 EXAM: PORTABLE CHEST 1 VIEW COMPARISON:  05/04/2019 FINDINGS: Persistent bilateral opacities. No pleural effusion or pneumothorax. Stable cardiomediastinal contours. IMPRESSION: Similar bilateral opacities compatible with COVID-19 pneumonia. Electronically Signed   By: Macy Mis M.D.   On: 05/01/2019 09:17   DG Chest Port 1 View  Result Date: 04/17/2019 CLINICAL DATA:  Brought in by EMS from home for SOB , COVID+, cough x 2 days EXAM: PORTABLE CHEST 1 VIEW COMPARISON:  04/17/2019 FINDINGS: There hazy airspace lung opacities predominantly in the mid to lower lungs, new or increased when compared to the prior exam. No convincing pleural effusion.  No pneumothorax. Cardiac silhouette is normal in size. No mediastinal or hilar masses. Skeletal  structures are grossly intact. IMPRESSION: 1. Bilateral hazy airspace lung opacities have developed since prior study consistent multifocal pneumonia due to COVID-19 infection. Electronically Signed   By: Lajean Manes M.D.   On: 05/13/2019 13:39   DG Chest Portable 1 View  Result Date: 04/17/2019 CLINICAL DATA:  Cough and shortness of breath.  Hypertension. EXAM: PORTABLE CHEST 1 VIEW COMPARISON:  April 01, 2017 FINDINGS: There is no appreciable edema or consolidation. Heart is mildly enlarged with pulmonary vascularity normal. No adenopathy. Mild prominence of the aorta is stable. There is aortic atherosclerosis. There is degenerative change in the thoracic spine. No adenopathy evident. IMPRESSION: No edema or consolidation. Stable cardiac prominence. Prominence of the aorta may reflect chronic hypertension and appears stable. Aortic Atherosclerosis (ICD10-I70.0). Electronically Signed   By: Lowella Grip III M.D.   On: 04/17/2019 13:02

## 2019-05-06 LAB — URINE CULTURE

## 2019-05-06 MED ORDER — MORPHINE SULFATE (PF) 2 MG/ML IV SOLN
2.0000 mg | INTRAVENOUS | Status: DC | PRN
  Administered 2019-05-06 (×3): 2 mg via INTRAVENOUS
  Filled 2019-05-06 (×4): qty 1

## 2019-05-07 LAB — GLUCOSE, CAPILLARY: Glucose-Capillary: 150 mg/dL — ABNORMAL HIGH (ref 70–99)

## 2019-05-07 NOTE — Progress Notes (Signed)
Attempted to contact daughter to inform of the passing of pt and arrangement information. This nurse called Ebony Cargo, the daughter of pt at 2327 on 1/24, 0105 on 1/25 and left messages and again at 0312 on 1/25. As of 0322 on 1/25 a phone call has not been returned from daughter. Charge nurse was made aware.

## 2019-05-14 NOTE — Progress Notes (Signed)
NEW ORDERS REC.

## 2019-05-14 NOTE — Progress Notes (Signed)
PROGRESS NOTE                                                                                                                                                                                                             Patient Demographics:    Lauralie Blacksher, is a 86 y.o. female, DOB - Apr 27, 1926, JSE:[id number]  Outpatient Primary MD for the patient is Debbrah Alar, NP   Admit date - 04/17/2019   LOS - 33  Chief Complaint  Patient presents with   Shortness of Breath       Brief Narrative: Patient is a 86 y.o. female with PMHx of HTN, HLD, glaucoma, legal blindness, history of corneal transplantation who presented as a transfer from [place] on 1/13 for evaluation of severe hypoxemia secondary to COVID-19 pneumonia.    She was diagnosed with COVID-19 on 1/5 and was isolating at home.  COVID-19 medications: Steroids: 1/13>> Remdesivir: 1/13>> Actemra: 1/13 x 1   Subjective:   Patient in bed she appears to be in no distress but more somnolent, not answering questions reliably.   Assessment  & Plan :   Acute Hypoxic Resp Failure due to Covid 19 Viral pneumonia Right-sided pneumothorax with pneumomediastinum and Mediastinits : She severe disease and was treated with full scope of treatment with IV steroids, Actemra and remdesivir.    Hypoxia standpoint she is improving steroids being tapered, currently on 3 to 4 L nasal cannula oxygen and appears to be in no distress. She was improving unfortunately developed Right-sided pneumothorax with pneumomediastinum and Mediastinitis with rapid decline on 05/05/19 - Now Full comfort care, DNR - Res. Hospice. All Non comfort Rx will be stopped. Spoke to daughter again on 05/05/2019 at 3 PM.     COVID-19 Labs: Recent Labs    05/04/19 0139  DDIMER 2.07*  CRP 0.9       Component Value Date/Time   BNP 145.5 (H) 05/05/2019 0510    Recent Labs  Lab  05/01/19 0607 05/02/19 0208 05/03/19 0225 05/05/19 1120  PROCALCITON <0.10 <0.10 0.19 0.24    No results found for: [place]   Hepatic Function Latest Ref  Rng & Units 05/05/2019 05/04/2019 05/03/2019  Total Protein 6.5 - 8.1 g/dL 5.3(L) 6.1(L) 6.6  Albumin 3.5 - 5.0 g/dL 2.6(L) 2.8(L) 2.9(L)  AST 15 - 41 U/L 32 23 27  ALT 0 - 44 U/L 32 42 58(H)  Alk Phosphatase 38 - 126 U/L 86 69 65  Total Bilirubin 0.3 - 1.2 mg/dL 0.7 0.9 1.1  Bilirubin, Direct 0.0 - 0.3 mg/dL - - -      Other medical issues addressed this admission prior to comfort measures are below    Transaminitis: Mild asymptomatic COVID-19 viral infection related transaminitis, resolved  AKI with hypernatremia due to dehydration.  Continue hydration and monitor, will check random bladder scan on 05/05/2019 as well.  Worsening leukocytosis.  Work-up showed possible UTI along with pneumomediastinum and pneumothorax which are small.  Placed on Rocephin and Flagyl for now.  No pain hence do not think she has esophageal perforation.  Right-sided pneumothorax with pneumomediastinum.  Likely due to Covid.  Will monitor.  Currently in no distress.  Urinary retention.  Foley and Flomax.    NSVT: Dramatic with stable magnesium, doubled beta-blocker.  65 to 70% in mid 2017 , no further work-up.  HTN: BP stable, Norvasc discontinued to double beta-blocker for above.  HLD: Continue statin  History of glaucoma/corneal transplantation/legal blindness: On usual home regimen of eyedrops.  Mild dehydration with hypernatremia.  Resolved after IV fluids.       Consults  :  None  Procedures  :  None   Condition - Stable  Family Communication  : Spoke with the patient's daughter on 1/17, spoke with daughter again on 05/01/2019.  Plan is comfort measures/hospice.  Message left on cell phone on 05/05/2019 at 11:50 AM, discussed again with daughter on 05/05/2019 at 3 PM  Code Status :  DNR  Diet :  Diet Order            DIET  SOFT Room service appropriate? Yes; Fluid consistency: Thin  Diet effective now               Disposition Plan  : Res.Hospice.  Barriers to discharge: Bed awaited  Antimicorbials  :    Anti-infectives (From admission, onward)   Start     Dose/Rate Route Frequency Ordered Stop   05/05/19 1200  metroNIDAZOLE (FLAGYL) IVPB 500 mg  Status:  Discontinued     500 mg 100 mL/hr over 60 Minutes Intravenous Every 8 hours 05/05/19 1148 05-21-19 0624   05/05/19 1100  cefTRIAXone (ROCEPHIN) 1 g in sodium chloride 0.9 % 100 mL IVPB  Status:  Discontinued     1 g 200 mL/hr over 30 Minutes Intravenous Every 24 hours 05/05/19 1002 May 21, 2019 0624   04/26/19 1000  remdesivir 100 mg in sodium chloride 0.9 % 100 mL IVPB     100 mg 200 mL/hr over 30 Minutes Intravenous Daily 05/12/2019 1500 04/29/19 0937   04/19/2019 1500  remdesivir 100 mg in sodium chloride 0.9 % 100 mL IVPB     100 mg 200 mL/hr over 30 Minutes Intravenous Every 30 min 04/29/2019 1451 04/20/2019 1614   05/11/2019 1345  cefTRIAXone (ROCEPHIN) 1 g in sodium chloride 0.9 % 100 mL IVPB     1 g 200 mL/hr over 30 Minutes Intravenous  Once 04/24/2019 1343 04/14/2019 1450   05/01/2019 1345  azithromycin (ZITHROMAX) 500 mg in sodium chloride 0.9 % 250 mL IVPB     500 mg 250 mL/hr over 60 Minutes Intravenous  Once 05/07/2019 1343  04/30/2019 1514     DVT Prophylaxis  :  Lovenox   Inpatient Medications  Scheduled Meds:  brimonidine  1 drop Both Eyes BID   And   timolol  1 drop Both Eyes BID   insulin aspart  0-5 Units Subcutaneous QHS   loteprednol  1 drop Right Eye QID   methylPREDNISolone (SOLU-MEDROL) injection  10 mg Intravenous Daily   pantoprazole  40 mg Oral Daily   prednisoLONE acetate  1 drop Left Eye BID   sodium chloride flush  3 mL Intravenous Q12H   tamsulosin  0.4 mg Oral Daily   Continuous Infusions:  PRN Meds:.acetaminophen, albuterol, chlorpheniramine-HYDROcodone, guaiFENesin-dextromethorphan, haloperidol lactate,  loperamide, morphine injection, [DISCONTINUED] ondansetron **OR** ondansetron (ZOFRAN) IV, polyethylene glycol   Time Spent in minutes 35   See all Orders from today for further details   Lala Lund M.D on May 18, 2019 at 9:18 AM  To page go to [email or web address] - use universal password  [facility] -  Office  [phone]    Objective:   Vitals:   05/05/19 0522 05/05/19 0800 05/05/19 1441 05/05/19 1450  BP: (!) 147/67 114/71  114/63  Pulse: 89 82  82  Resp: 18 17    Temp: 98.4 F (36.9 C) 98.2 F (36.8 C) (!) 96.5 F (35.8 C) (!) 95.8 F (35.4 C)  TempSrc: Oral Axillary Axillary Rectal  SpO2: 94% 95% (!) 66% (!) 66%  Weight:      Height:        Wt Readings from Last 3 Encounters:  05/04/2019 61.3 kg  04/17/19 63.5 kg  08/09/18 65 kg     Intake/Output Summary (Last 24 hours) at 05-18-2019 0918 Last data filed at 05/05/2019 1704 Gross per 24 hour  Intake --  Output 920 ml  Net -920 ml     Physical Exam  Somnolent but comfortable, unable to follow commands [place].AT,PERRAL Supple Neck,No JVD, No cervical lymphadenopathy appriciated.  Symmetrical Chest wall movement, Good air movement bilaterally, CTAB RRR,No Gallops, Rubs or new Murmurs, No Parasternal Heave +ve B.Sounds, Abd Soft, No tenderness, No organomegaly appriciated, No rebound - guarding or rigidity. No Cyanosis, Clubbing or edema, No new Rash or bruise    Data Review:    CBC Recent Labs  Lab 05/01/19 0543 05/01/19 0543 05/02/19 3762 05/03/19 0225 05/03/19 0932 05/04/19 0139 05/05/19 0510  WBC 15.8*  --  16.4* 20.0*  --  23.2* 29.0*  HGB 16.1*   < > 17.1* 18.7* 17.0* 17.1* 17.1*  HCT 49.4*   < > 51.5* 56.2* 50.0* 52.1* 52.4*  PLT 335  --  334 301  --  315 219  MCV 84.6  --  84.4 85.0  --  85.3 84.8  MCH 27.6  --  28.0 28.3  --  28.0 27.7  MCHC 32.6  --  33.2 33.3  --  32.8 32.6  RDW 14.8  --  14.6 15.9*  --  15.2 16.0*  LYMPHSABS 0.9  --  0.7 1.0  --  1.0 1.2  MONOABS 1.1*  --  0.9  1.0  --  1.2* 1.4*  EOSABS 0.0  --  0.0 0.0  --  0.0 0.0  BASOSABS 0.1  --  0.1 0.2*  --  0.1 0.2*   < > = values in this interval not displayed.    Chemistries  Recent Labs  Lab 04/30/19 0156 04/30/19 0156 05/01/19 0543 05/01/19 0543 05/02/19 8315 05/03/19 0225 05/03/19 0932 05/04/19 0139 05/05/19 0510  NA 140   < >  143   < > 143 148* 147* 148* 137  K 4.5   < > 4.7   < > 4.7 4.9 4.2 4.9 4.4  CL 105   < > 108  --  106 107  --  107 98  CO2 25   < > 25  --  26 26  --  25 21*  GLUCOSE 115*   < > 106*  --  141* 112*  --  146* 121*  BUN 40*   < > 43*  --  50* 58*  --  86* 98*  CREATININE 1.00   < > 1.03*  --  1.21* 1.33*  --  1.91* 2.19*  CALCIUM 9.2   < > 9.4  --  9.3 9.6  --  9.4 8.5*  MG 2.4  --  2.4  --  2.5* 2.8*  --  2.9*  --   AST 30   < > 36  --  30 27  --  23 32  ALT 43   < > 63*  --  68* 58*  --  42 32  ALKPHOS 43   < > 50  --  52 65  --  69 86  BILITOT 0.5   < > 0.6  --  0.9 1.1  --  0.9 0.7   < > = values in this interval not displayed.   ------------------------------------------------------------------------------------------------------------------ No results for input(s): CHOL, HDL, LDLCALC, TRIG, CHOLHDL, LDLDIRECT in the last 72 hours.  Lab Results  Component Value Date   HGBA1C 6.1 (H) 04/26/2019   ------------------------------------------------------------------------------------------------------------------ No results for input(s): TSH, T4TOTAL, T3FREE, THYROIDAB in the last 72 hours.  Invalid input(s): FREET3 ------------------------------------------------------------------------------------------------------------------ No results for input(s): VITAMINB12, FOLATE, FERRITIN, TIBC, IRON, RETICCTPCT in the last 72 hours.  Coagulation profile No results for input(s): INR, PROTIME in the last 168 hours.  Recent Labs    05/04/19 0139  DDIMER 2.07*    Cardiac Enzymes No results for input(s): CKMB, TROPONINI, MYOGLOBIN in the last 168  hours.  Invalid input(s): CK ------------------------------------------------------------------------------------------------------------------    Component Value Date/Time   BNP 145.5 (H) 05/05/2019 0510    Micro Results No results found for this or any previous visit (from the past 240 hour(s)).  Radiology Reports CT CHEST WO CONTRAST  Result Date: 05/05/2019 CLINICAL DATA:  Esophageal perforation. Previous reports described COVID-19 positivity. EXAM: CT CHEST WITHOUT CONTRAST TECHNIQUE: Multidetector CT imaging of the chest was performed following the standard protocol without IV contrast. COMPARISON:  Chest CT angiogram dated 09/07/2017. FINDINGS: Cardiovascular: No thoracic aortic aneurysm. Heart size is within normal limits. No pericardial effusion. 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 Mediastinum/Nodes: Pneumomediastinum is confirmed (identified on today's earlier chest x-ray). No mass or enlarged lymph nodes are seen within the mediastinum. No fluid collection or abscess collection identified. Esophagus is unremarkable. Trachea and central bronchi are unremarkable. Lungs/Pleura: Diffuse ground-glass opacities, bibasilar predominant. Small RIGHT pneumothorax. Small RIGHT pleural effusion. Upper Abdomen: No acute findings. Indeterminate mass exophytic to the posterior cortex of the RIGHT kidney measures at least 1.5 cm, incompletely imaged. Musculoskeletal: No acute or suspicious osseous finding. IMPRESSION: 1. Pneumomediastinum is confirmed (identified on today's earlier chest x-ray). No fluid collection or abscess collection identified within the mediastinum. This may be related to the given history of esophageal perforation or could be related to the small RIGHT-sided pneumothorax. 2. Small RIGHT pneumothorax. 3. Diffuse ground-glass opacities, bibasilar predominant, compatible with  diffuse bilateral  COVID-19 pneumonia. 4. Small RIGHT pleural effusion. 5. **An incidental finding of potential clinical significance has been found. indeterminate mass exophytic to the posterior cortex of the RIGHT kidney measures at least 1.5 cm, incompletely imaged.** 6. 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 These results will be called to the ordering clinician or representative by the Radiologist Assistant, and communication documented in the PACS or zVision Dashboard. Electronically Signed   By: Franki Cabot M.D.   On: 05/05/2019 11:39   DG Chest Port 1 View  Result Date: 05/05/2019 CLINICAL DATA:  Shortness of breath EXAM: PORTABLE CHEST 1 VIEW COMPARISON:  Chest x-rays dated 05/01/2019 and 04/18/2019. FINDINGS: Stable mild cardiomegaly. There are new lucencies projected over the heart indicating pneumomediastinum or pneumopericardium. Diffuse bilateral interstitial opacities are unchanged. Probable atelectasis and/or small pleural effusion at the LEFT lung base. No pneumothorax identified. Osseous structures about the chest are unremarkable. IMPRESSION: 1. New pneumomediastinum or pneumopericardium. Recommend chest CT for further characterization. 2. Stable mild cardiomegaly. 3. Diffuse bilateral interstitial opacities, likely interstitial edema, compatible with CHF/volume overload versus atypical pneumonia. These results will be called to the ordering clinician or representative by the Radiologist Assistant, and communication documented in the PACS or zVision Dashboard. Electronically Signed   By: Franki Cabot M.D.   On: 05/05/2019 09:59   DG Chest Port 1 View  Result Date: 05/01/2019 CLINICAL DATA:  COVID-19 EXAM: PORTABLE CHEST 1 VIEW COMPARISON:  04/24/2019 FINDINGS: Persistent bilateral opacities. No pleural effusion or pneumothorax. Stable cardiomediastinal contours. IMPRESSION: Similar bilateral opacities compatible  with COVID-19 pneumonia. Electronically Signed   By: Macy Mis M.D.   On: 05/01/2019 09:17   DG Chest Port 1 View  Result Date: 05/10/2019 CLINICAL DATA:  Brought in by EMS from home for SOB , COVID+, cough x 2 days EXAM: PORTABLE CHEST 1 VIEW COMPARISON:  04/17/2019 FINDINGS: There hazy airspace lung opacities predominantly in the mid to lower lungs, new or increased when compared to the prior exam. No convincing pleural effusion.  No pneumothorax. Cardiac silhouette is normal in size. No mediastinal or hilar masses. Skeletal structures are grossly intact. IMPRESSION: 1. Bilateral hazy airspace lung opacities have developed since prior study consistent multifocal pneumonia due to COVID-19 infection. Electronically Signed   By: Lajean Manes M.D.   On: 05/09/2019 13:39   DG Chest Portable 1 View  Result Date: 04/17/2019 CLINICAL DATA:  Cough and shortness of breath.  Hypertension. EXAM: PORTABLE CHEST 1 VIEW COMPARISON:  April 01, 2017 FINDINGS: There is no appreciable edema or consolidation. Heart is mildly enlarged with pulmonary vascularity normal. No adenopathy. Mild prominence of the aorta is stable. There is aortic atherosclerosis. There is degenerative change in the thoracic spine. No adenopathy evident. IMPRESSION: No edema or consolidation. Stable cardiac prominence. Prominence of the aorta may reflect chronic hypertension and appears stable. Aortic Atherosclerosis (ICD10-I70.0). Electronically Signed   By: Lowella Grip III M.D.   On: 04/17/2019 13:02

## 2019-05-14 NOTE — Discharge Summary (Signed)
[facility] Death Note                                                                                                                                                                                               Mary Trevino, is a 86 y.o. female, DOB - Sep 08, 1926, KXF:[id number]  Admit date - 05/10/2019   Admitting Physician Evalee Mutton Kristeen Mans, MD  Outpatient Primary MD for the patient is Debbrah Alar, NP  LOS - 11  Chief Complaint  Patient presents with  . Shortness of Breath       Notification: Debbrah Alar, NP notified of death of 05/31/2019   Date of Death - 05-25-2019  Pronounced by - RN  History of present illness:   Mary Trevino is a 86 y.o. female with a history of -  Patient is a 86 y.o. female with PMHx of HTN, HLD, glaucoma, legal blindness, history of corneal transplantation who presented as a transfer from [place] on 1/13 for evaluation of severe hypoxemia secondary to COVID-19 pneumonia.  She was fully treated however she continued to decline and developed complications with pneumomediastinum and mediastinitis after which she was transitioned to full comfort care.  She passed away comfortably on 25-May-2019 pronounced by the RN late at night.   Final Diagnoses:  Cause if death - Pneumomediastinum   Signature  Lala Lund M.D on 2019/05/31 at 7:14 AM  [facility]  Office Phone [phone]  Total clinical and documentation time for today Under 30 minutes   Last Note                                                                      PROGRESS NOTE  Patient Demographics:    Mary Trevino, is a 86 y.o. female, DOB - December 03, 1926,  [id number]  Outpatient Primary MD for the patient is Debbrah Alar, NP   Admit date - 05/11/2019   LOS - 11  Chief Complaint  Patient presents with  . Shortness of Breath       Brief Narrative: Patient is a 86 y.o. female with PMHx of HTN, HLD, glaucoma, legal blindness, history of corneal transplantation who presented as a transfer from [place] on 1/13 for evaluation of severe hypoxemia secondary to COVID-19 pneumonia.    She was diagnosed with COVID-19 on 1/5 and was isolating at home.  COVID-19 medications: Steroids: 1/13>> Remdesivir: 1/13>> Actemra: 1/13 x 1   Subjective:   Patient in bed she appears to be in no distress but more somnolent, not answering questions reliably.   Assessment  & Plan :   Acute Hypoxic Resp Failure due to Covid 19 Viral pneumonia Right-sided pneumothorax with pneumomediastinum and Mediastinits : She severe disease and was treated with full scope of treatment with IV steroids, Actemra and remdesivir.    Hypoxia standpoint she is improving steroids being tapered, currently on 3 to 4 L nasal cannula oxygen and appears to be in no distress. She was improving unfortunately developed Right-sided pneumothorax with pneumomediastinum and Mediastinitis with rapid decline on 05/05/19 - Now Full comfort care, DNR - Res. Hospice. All Non comfort Rx will be stopped. Spoke to daughter again on 05/05/2019 at 3 PM.     COVID-19 Labs: No results for input(s): DDIMER, FERRITIN, LDH, CRP in the last 72 hours.     Component Value Date/Time   BNP 145.5 (H) 05/05/2019 0510    Recent Labs  Lab 05/05/19 1120  PROCALCITON 0.24    No results found for: SARSCOV2NAA   Hepatic Function Latest Ref Rng & Units 05/05/2019 05/04/2019 05/03/2019  Total Protein 6.5 - 8.1 g/dL 5.3(L) 6.1(L) 6.6  Albumin 3.5 - 5.0 g/dL 2.6(L) 2.8(L) 2.9(L)  AST 15 - 41 U/L 32 23 27  ALT 0 - 44 U/L 32 42 58(H)  Alk Phosphatase 38 - 126 U/L 86 69 65  Total Bilirubin 0.3 -  1.2 mg/dL 0.7 0.9 1.1  Bilirubin, Direct 0.0 - 0.3 mg/dL - - -      Other medical issues addressed this admission prior to comfort measures are below    Transaminitis: Mild asymptomatic COVID-19 viral infection related transaminitis, resolved  AKI with hypernatremia due to dehydration.  Continue hydration and monitor, will check random bladder scan on 05/05/2019 as well.  Worsening leukocytosis.  Work-up showed possible UTI along with pneumomediastinum and pneumothorax which are small.  Placed on Rocephin and Flagyl for now.  No pain hence do not think she has esophageal perforation.  Right-sided pneumothorax with pneumomediastinum.  Likely due to Covid.  Will monitor.  Currently in no distress.  Urinary retention.  Foley and Flomax.    NSVT: Dramatic with stable magnesium, doubled beta-blocker.  65 to 70% in mid 2017 , no further work-up.  HTN: BP stable, Norvasc discontinued to double beta-blocker for above.  HLD: Continue statin  History of glaucoma/corneal transplantation/legal blindness: On usual home regimen of eyedrops.  Mild dehydration with hypernatremia.  Resolved after IV fluids.       Consults  :  None  Procedures  :  None   Condition - Stable  Family Communication  : Spoke with the patient's daughter on 1/17, spoke with daughter again on 05/01/2019.  Plan  is comfort measures/hospice.  Message left on cell phone on 05/05/2019 at 11:50 AM, discussed again with daughter on 05/05/2019 at 3 PM  Code Status :  DNR  Diet :  Diet Order    None       Disposition Plan  : Res.Hospice.  Barriers to discharge: Bed awaited  Antimicorbials  :    Anti-infectives (From admission, onward)   Start     Dose/Rate Route Frequency Ordered Stop   05/05/19 1200  metroNIDAZOLE (FLAGYL) IVPB 500 mg  Status:  Discontinued     500 mg 100 mL/hr over 60 Minutes Intravenous Every 8 hours 05/05/19 1148 05/16/19 0624   05/05/19 1100  cefTRIAXone (ROCEPHIN) 1 g in sodium chloride  0.9 % 100 mL IVPB  Status:  Discontinued     1 g 200 mL/hr over 30 Minutes Intravenous Every 24 hours 05/05/19 1002 2019-05-16 0624   04/26/19 1000  remdesivir 100 mg in sodium chloride 0.9 % 100 mL IVPB     100 mg 200 mL/hr over 30 Minutes Intravenous Daily 04/24/2019 1500 04/29/19 0937   04/30/2019 1500  remdesivir 100 mg in sodium chloride 0.9 % 100 mL IVPB     100 mg 200 mL/hr over 30 Minutes Intravenous Every 30 min 05/11/2019 1451 05/04/2019 1614   05/03/2019 1345  cefTRIAXone (ROCEPHIN) 1 g in sodium chloride 0.9 % 100 mL IVPB     1 g 200 mL/hr over 30 Minutes Intravenous  Once 04/21/2019 1343 05/07/2019 1450   04/17/2019 1345  azithromycin (ZITHROMAX) 500 mg in sodium chloride 0.9 % 250 mL IVPB     500 mg 250 mL/hr over 60 Minutes Intravenous  Once 04/22/2019 1343 04/24/2019 1514     DVT Prophylaxis  :  Lovenox   Inpatient Medications  Scheduled Meds:  Continuous Infusions:  PRN Meds:.   Time Spent in minutes 35   See all Orders from today for further details   Lala Lund M.D on 05/12/2019 at 7:14 AM  To page go to [email or web address] - use universal password  [facility] -  Office  [phone]    Objective:   Vitals:   05/05/19 1450 2019/05/16 0900 05-16-2019 1945 05-16-19 2336  BP: 114/63 (!) 79/49 (!) 82/52   Pulse: 82 (!) 108 (!) 102   Resp:  (!) 26    Temp: (!) 95.8 F (35.4 C) 98.3 F (36.8 C) 98.2 F (36.8 C)   TempSrc: Rectal Oral Oral   SpO2: (!) 66% (!) 44% (!) 82%   Weight:    61.3 kg  Height:    '5\' 5"'$  (1.651 m)    Wt Readings from Last 3 Encounters:  05-16-19 61.3 kg  04/17/19 63.5 kg  08/09/18 65 kg    No intake or output data in the 24 hours ending 05/12/19 0714   Physical Exam  Somnolent but comfortable, unable to follow commands [place].AT,PERRAL Supple Neck,No JVD, No cervical lymphadenopathy appriciated.  Symmetrical Chest wall movement, Good air movement bilaterally, CTAB RRR,No Gallops, Rubs or new Murmurs, No Parasternal Heave +ve B.Sounds,  Abd Soft, No tenderness, No organomegaly appriciated, No rebound - guarding or rigidity. No Cyanosis, Clubbing or edema, No new Rash or bruise    Data Review:    CBC No results for input(s): WBC, HGB, HCT, PLT, MCV, MCH, MCHC, RDW, LYMPHSABS, MONOABS, EOSABS, BASOSABS, BANDABS in the last 168 hours.  Invalid input(s): NEUTRABS, BANDSABD  Chemistries  No results for input(s): NA, K, CL, CO2, GLUCOSE, BUN, CREATININE, CALCIUM, MG, AST,  ALT, ALKPHOS, BILITOT in the last 168 hours.  Invalid input(s): GFRCGP ------------------------------------------------------------------------------------------------------------------ No results for input(s): CHOL, HDL, LDLCALC, TRIG, CHOLHDL, LDLDIRECT in the last 72 hours.  Lab Results  Component Value Date   HGBA1C 6.1 (H) 04/26/2019   ------------------------------------------------------------------------------------------------------------------ No results for input(s): TSH, T4TOTAL, T3FREE, THYROIDAB in the last 72 hours.  Invalid input(s): FREET3 ------------------------------------------------------------------------------------------------------------------ No results for input(s): VITAMINB12, FOLATE, FERRITIN, TIBC, IRON, RETICCTPCT in the last 72 hours.  Coagulation profile No results for input(s): INR, PROTIME in the last 168 hours.  No results for input(s): DDIMER in the last 72 hours.  Cardiac Enzymes No results for input(s): CKMB, TROPONINI, MYOGLOBIN in the last 168 hours.  Invalid input(s): CK ------------------------------------------------------------------------------------------------------------------    Component Value Date/Time   BNP 145.5 (H) 05/05/2019 0510    Micro Results Recent Results (from the past 240 hour(s))  Culture, Urine     Status: Abnormal   Collection Time: 05/05/19  9:31 AM   Specimen: Urine, Catheterized  Result Value Ref Range Status   Specimen Description   Final    URINE,  CATHETERIZED Performed at [facility] [address] [place], [place] [address]    Special Requests   Final    NONE Performed at [facility], [place] [address], [place], [place] [address]    Culture MULTIPLE SPECIES PRESENT, SUGGEST RECOLLECTION (A)  Final   Report Status May 24, 2019 FINAL  Final    Radiology Reports CT CHEST WO CONTRAST  Result Date: 05/05/2019 CLINICAL DATA:  Esophageal perforation. Previous reports described COVID-19 positivity. EXAM: CT CHEST WITHOUT CONTRAST TECHNIQUE: Multidetector CT imaging of the chest was performed following the standard protocol without IV contrast. COMPARISON:  Chest CT angiogram dated 09/07/2017. FINDINGS: Cardiovascular: No thoracic aortic aneurysm. Heart size is within normal limits. No pericardial effusion. 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 Mediastinum/Nodes: Pneumomediastinum is confirmed (identified on today's earlier chest x-ray). No mass or enlarged lymph nodes are seen within the mediastinum. No fluid collection or abscess collection identified. Esophagus is unremarkable. Trachea and central bronchi are unremarkable. Lungs/Pleura: Diffuse ground-glass opacities, bibasilar predominant. Small RIGHT pneumothorax. Small RIGHT pleural effusion. Upper Abdomen: No acute findings. Indeterminate mass exophytic to the posterior cortex of the RIGHT kidney measures at least 1.5 cm, incompletely imaged. Musculoskeletal: No acute or suspicious osseous finding. IMPRESSION: 1. Pneumomediastinum is confirmed (identified on today's earlier chest x-ray). No fluid collection or abscess collection identified within the mediastinum. This may be related to the given history of esophageal perforation or could be related to the small RIGHT-sided pneumothorax. 2. Small RIGHT pneumothorax. 3. Diffuse ground-glass opacities, bibasilar predominant,  compatible with diffuse bilateral COVID-19 pneumonia. 4. Small RIGHT pleural effusion. 5. **An incidental finding of potential clinical significance has been found. indeterminate mass exophytic to the posterior cortex of the RIGHT kidney measures at least 1.5 cm, incompletely imaged.** 6. 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 These results will be called to the ordering clinician or representative by the Radiologist Assistant, and communication documented in the PACS or zVision Dashboard. Electronically Signed   By: Franki Cabot M.D.   On: 05/05/2019 11:39   DG Chest Port 1 View  Result Date: 05/05/2019 CLINICAL DATA:  Shortness of breath EXAM: PORTABLE CHEST 1 VIEW COMPARISON:  Chest x-rays dated 05/01/2019 and 04/26/2019. FINDINGS: Stable mild cardiomegaly. There are new lucencies  projected over the heart indicating pneumomediastinum or pneumopericardium. Diffuse bilateral interstitial opacities are unchanged. Probable atelectasis and/or small pleural effusion at the LEFT lung base. No pneumothorax identified. Osseous structures about the chest are unremarkable. IMPRESSION: 1. New pneumomediastinum or pneumopericardium. Recommend chest CT for further characterization. 2. Stable mild cardiomegaly. 3. Diffuse bilateral interstitial opacities, likely interstitial edema, compatible with CHF/volume overload versus atypical pneumonia. These results will be called to the ordering clinician or representative by the Radiologist Assistant, and communication documented in the PACS or zVision Dashboard. Electronically Signed   By: Franki Cabot M.D.   On: 05/05/2019 09:59   DG Chest Port 1 View  Result Date: 05/01/2019 CLINICAL DATA:  COVID-19 EXAM: PORTABLE CHEST 1 VIEW COMPARISON:  05/02/2019 FINDINGS: Persistent bilateral opacities. No pleural effusion or pneumothorax. Stable cardiomediastinal contours. IMPRESSION: Similar  bilateral opacities compatible with COVID-19 pneumonia. Electronically Signed   By: Macy Mis M.D.   On: 05/01/2019 09:17   DG Chest Port 1 View  Result Date: 05/08/2019 CLINICAL DATA:  Brought in by EMS from home for SOB , COVID+, cough x 2 days EXAM: PORTABLE CHEST 1 VIEW COMPARISON:  04/17/2019 FINDINGS: There hazy airspace lung opacities predominantly in the mid to lower lungs, new or increased when compared to the prior exam. No convincing pleural effusion.  No pneumothorax. Cardiac silhouette is normal in size. No mediastinal or hilar masses. Skeletal structures are grossly intact. IMPRESSION: 1. Bilateral hazy airspace lung opacities have developed since prior study consistent multifocal pneumonia due to COVID-19 infection. Electronically Signed   By: Lajean Manes M.D.   On: 04/30/2019 13:39   DG Chest Portable 1 View  Result Date: 04/17/2019 CLINICAL DATA:  Cough and shortness of breath.  Hypertension. EXAM: PORTABLE CHEST 1 VIEW COMPARISON:  April 01, 2017 FINDINGS: There is no appreciable edema or consolidation. Heart is mildly enlarged with pulmonary vascularity normal. No adenopathy. Mild prominence of the aorta is stable. There is aortic atherosclerosis. There is degenerative change in the thoracic spine. No adenopathy evident. IMPRESSION: No edema or consolidation. Stable cardiac prominence. Prominence of the aorta may reflect chronic hypertension and appears stable. Aortic Atherosclerosis (ICD10-I70.0). Electronically Signed   By: Lowella Grip III M.D.   On: 04/17/2019 13:02

## 2019-05-14 DEATH — deceased

## 2019-10-05 IMAGING — XA DG INJECT/[PERSON_NAME] INC NEEDLE/CATH/PLC EPI/CERV/THOR W/IMG
2 series · 2 of 2 positions shown · non-contrast
Comparison: none

CLINICAL DATA: Spondylosis without myelopathy. Left-sided neck and
shoulder pain. No measurable improvement from the initial injection.

[Series 1: ortho standard · 1 of 1 slices shown (1 of 2)]
[im 1/1]
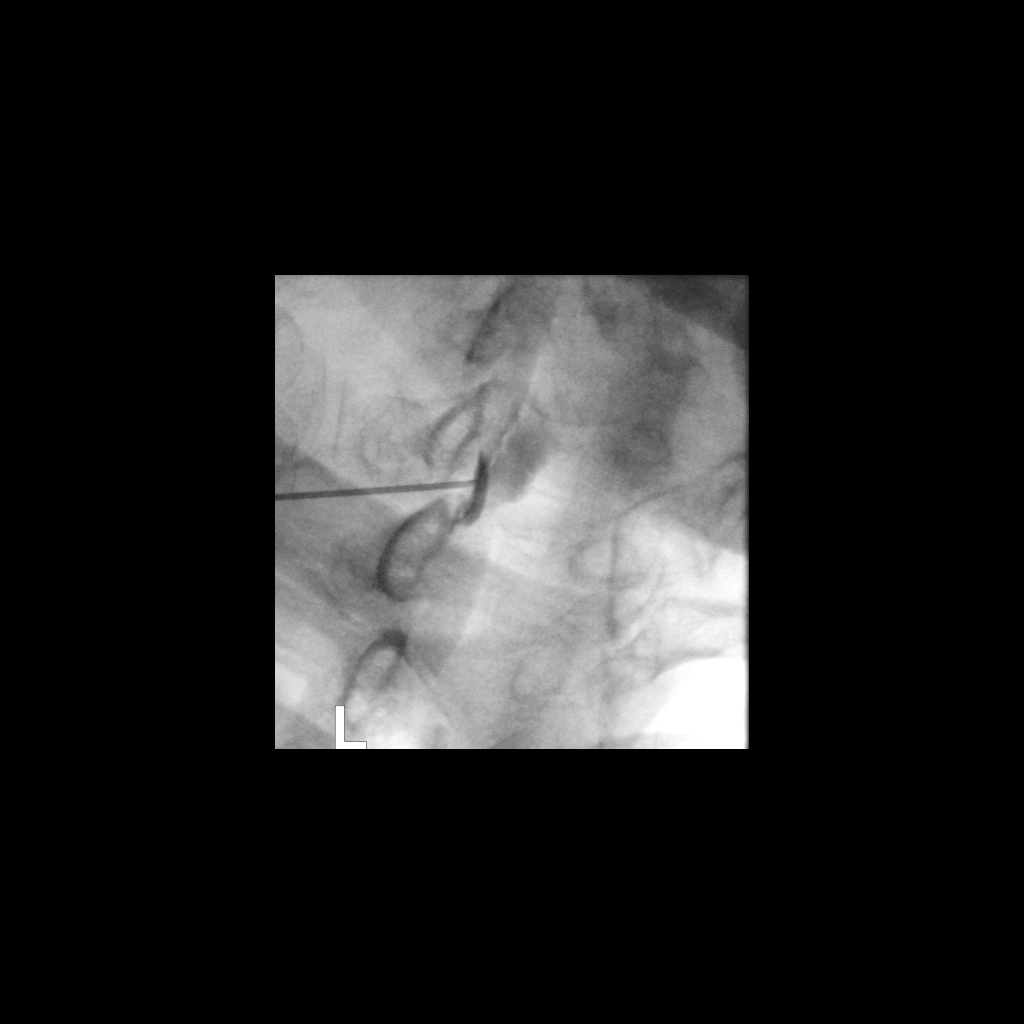

[Series 2: ortho standard · 1 of 1 slices shown (2 of 2)]
[im 1/1]
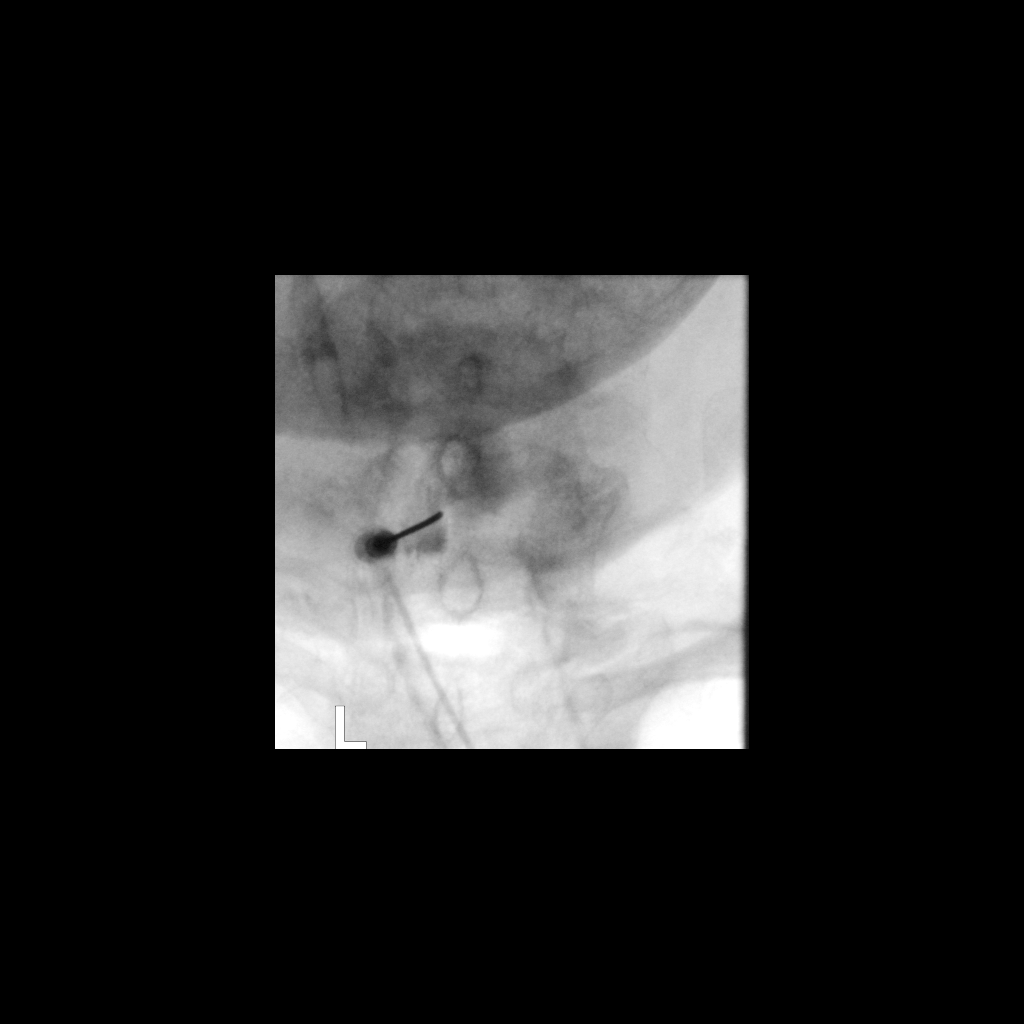

[2 of 2 positions shown; findings below may reference images not displayed]

FLUOROSCOPY TIME:  0 minutes 35 seconds. 32.99 micro gray meter
squared

PROCEDURE:
CERVICAL EPIDURAL INJECTION

An interlaminar approach was performed on the left at C7 . A 20
gauge epidural needle was advanced using loss-of-resistance
technique.

DIAGNOSTIC EPIDURAL INJECTION

Injection of Isovue-M 300 shows a good epidural pattern with spread
above and below the level of needle placement, primarily on the
left, but to both sides. No vascular opacification is seen.
THERAPEUTIC

EPIDURAL INJECTION

1.5 ml of Kenalog 40 mixed with 1 ml of 1% Lidocaine and 2 ml of
normal saline were then instilled. The procedure was well-tolerated,
and the patient was discharged thirty minutes following the
injection in good condition.
IMPRESSION: Technically successful second epidural injection on the left at C7.
